# Patient Record
Sex: Female | Born: 1965 | Race: Black or African American | Hispanic: No | Smoking: Current every day smoker
Health system: Southern US, Community
[De-identification: ages and names within clinical notes are randomized; demographics above are authoritative.]

## PROBLEM LIST (undated history)

## (undated) DIAGNOSIS — R011 Cardiac murmur, unspecified: Secondary | ICD-10-CM

## (undated) DIAGNOSIS — G35D Multiple sclerosis, unspecified: Secondary | ICD-10-CM

## (undated) DIAGNOSIS — J189 Pneumonia, unspecified organism: Secondary | ICD-10-CM

## (undated) DIAGNOSIS — M1990 Unspecified osteoarthritis, unspecified site: Secondary | ICD-10-CM

## (undated) DIAGNOSIS — G709 Myoneural disorder, unspecified: Secondary | ICD-10-CM

## (undated) DIAGNOSIS — I6529 Occlusion and stenosis of unspecified carotid artery: Secondary | ICD-10-CM

## (undated) DIAGNOSIS — I639 Cerebral infarction, unspecified: Secondary | ICD-10-CM

## (undated) DIAGNOSIS — G35 Multiple sclerosis: Secondary | ICD-10-CM

## (undated) DIAGNOSIS — E785 Hyperlipidemia, unspecified: Secondary | ICD-10-CM

## (undated) DIAGNOSIS — E119 Type 2 diabetes mellitus without complications: Secondary | ICD-10-CM

## (undated) DIAGNOSIS — I2699 Other pulmonary embolism without acute cor pulmonale: Secondary | ICD-10-CM

## (undated) DIAGNOSIS — R7303 Prediabetes: Secondary | ICD-10-CM

## (undated) DIAGNOSIS — I10 Essential (primary) hypertension: Secondary | ICD-10-CM

## (undated) DIAGNOSIS — N189 Chronic kidney disease, unspecified: Secondary | ICD-10-CM

## (undated) DIAGNOSIS — F319 Bipolar disorder, unspecified: Secondary | ICD-10-CM

## (undated) HISTORY — DX: Multiple sclerosis, unspecified: G35.D

## (undated) HISTORY — DX: Hyperlipidemia, unspecified: E78.5

## (undated) HISTORY — DX: Myoneural disorder, unspecified: G70.9

## (undated) HISTORY — DX: Chronic kidney disease, unspecified: N18.9

## (undated) HISTORY — DX: Pneumonia, unspecified organism: J18.9

## (undated) HISTORY — DX: Other pulmonary embolism without acute cor pulmonale: I26.99

## (undated) HISTORY — DX: Essential (primary) hypertension: I10

## (undated) HISTORY — DX: Prediabetes: R73.03

## (undated) HISTORY — DX: Type 2 diabetes mellitus without complications: E11.9

## (undated) HISTORY — DX: Occlusion and stenosis of unspecified carotid artery: I65.29

## (undated) HISTORY — DX: Bipolar disorder, unspecified: F31.9

## (undated) HISTORY — PX: TONSILLECTOMY: SUR1361

## (undated) HISTORY — DX: Multiple sclerosis: G35

---

## 1991-01-12 HISTORY — PX: NEPHRECTOMY LIVING DONOR: SUR877

## 1992-01-12 DIAGNOSIS — I2699 Other pulmonary embolism without acute cor pulmonale: Secondary | ICD-10-CM

## 1992-01-12 HISTORY — DX: Other pulmonary embolism without acute cor pulmonale: I26.99

## 1997-02-11 ENCOUNTER — Encounter: Admission: RE | Admit: 1997-02-11 | Discharge: 1997-05-12 | Payer: Self-pay | Admitting: Gynecology

## 1997-06-01 ENCOUNTER — Encounter: Admission: RE | Admit: 1997-06-01 | Discharge: 1997-08-20 | Payer: Self-pay | Admitting: *Deleted

## 1998-05-11 ENCOUNTER — Ambulatory Visit (HOSPITAL_COMMUNITY): Admission: RE | Admit: 1998-05-11 | Discharge: 1998-05-11 | Payer: Self-pay | Admitting: Orthopedic Surgery

## 1998-05-11 ENCOUNTER — Encounter: Payer: Self-pay | Admitting: Orthopedic Surgery

## 1999-01-12 HISTORY — PX: OTHER SURGICAL HISTORY: SHX169

## 1999-01-12 HISTORY — PX: APPENDECTOMY: SHX54

## 1999-05-15 ENCOUNTER — Emergency Department (HOSPITAL_COMMUNITY): Admission: EM | Admit: 1999-05-15 | Discharge: 1999-05-15 | Payer: Self-pay | Admitting: Emergency Medicine

## 1999-05-15 ENCOUNTER — Encounter: Payer: Self-pay | Admitting: Emergency Medicine

## 2000-06-24 ENCOUNTER — Other Ambulatory Visit: Admission: RE | Admit: 2000-06-24 | Discharge: 2000-06-24 | Payer: Self-pay | Admitting: *Deleted

## 2000-09-20 ENCOUNTER — Encounter: Admission: RE | Admit: 2000-09-20 | Discharge: 2000-09-20 | Payer: Self-pay | Admitting: Orthopaedic Surgery

## 2000-09-20 ENCOUNTER — Encounter: Payer: Self-pay | Admitting: Orthopaedic Surgery

## 2000-09-30 ENCOUNTER — Emergency Department (HOSPITAL_COMMUNITY): Admission: EM | Admit: 2000-09-30 | Discharge: 2000-09-30 | Payer: Self-pay | Admitting: *Deleted

## 2000-09-30 ENCOUNTER — Encounter: Payer: Self-pay | Admitting: Emergency Medicine

## 2000-10-18 ENCOUNTER — Encounter: Admission: RE | Admit: 2000-10-18 | Discharge: 2000-10-31 | Payer: Self-pay | Admitting: Orthopaedic Surgery

## 2000-11-02 ENCOUNTER — Ambulatory Visit (HOSPITAL_COMMUNITY): Admission: RE | Admit: 2000-11-02 | Discharge: 2000-11-02 | Payer: Self-pay

## 2000-11-02 ENCOUNTER — Encounter (INDEPENDENT_AMBULATORY_CARE_PROVIDER_SITE_OTHER): Payer: Self-pay

## 2002-05-08 ENCOUNTER — Other Ambulatory Visit: Admission: RE | Admit: 2002-05-08 | Discharge: 2002-05-08 | Payer: Self-pay | Admitting: *Deleted

## 2003-02-11 ENCOUNTER — Inpatient Hospital Stay (HOSPITAL_COMMUNITY): Admission: EM | Admit: 2003-02-11 | Discharge: 2003-02-15 | Payer: Self-pay | Admitting: Psychiatry

## 2003-05-19 ENCOUNTER — Emergency Department (HOSPITAL_COMMUNITY): Admission: EM | Admit: 2003-05-19 | Discharge: 2003-05-20 | Payer: Self-pay

## 2004-06-04 ENCOUNTER — Emergency Department (HOSPITAL_COMMUNITY): Admission: EM | Admit: 2004-06-04 | Discharge: 2004-06-05 | Payer: Self-pay | Admitting: Emergency Medicine

## 2004-06-06 ENCOUNTER — Observation Stay (HOSPITAL_COMMUNITY): Admission: RE | Admit: 2004-06-06 | Discharge: 2004-06-08 | Payer: Self-pay | Admitting: Orthopedic Surgery

## 2004-08-12 ENCOUNTER — Inpatient Hospital Stay (HOSPITAL_COMMUNITY): Admission: EM | Admit: 2004-08-12 | Discharge: 2004-08-17 | Payer: Self-pay | Admitting: Emergency Medicine

## 2004-11-20 ENCOUNTER — Encounter: Admission: RE | Admit: 2004-11-20 | Discharge: 2004-11-20 | Payer: Self-pay | Admitting: Orthopedic Surgery

## 2004-12-21 ENCOUNTER — Ambulatory Visit (HOSPITAL_COMMUNITY): Admission: RE | Admit: 2004-12-21 | Discharge: 2004-12-21 | Payer: Self-pay | Admitting: General Surgery

## 2004-12-21 ENCOUNTER — Ambulatory Visit (HOSPITAL_BASED_OUTPATIENT_CLINIC_OR_DEPARTMENT_OTHER): Admission: RE | Admit: 2004-12-21 | Discharge: 2004-12-21 | Payer: Self-pay | Admitting: General Surgery

## 2004-12-21 ENCOUNTER — Encounter (INDEPENDENT_AMBULATORY_CARE_PROVIDER_SITE_OTHER): Payer: Self-pay | Admitting: Specialist

## 2005-04-02 ENCOUNTER — Encounter
Admission: RE | Admit: 2005-04-02 | Discharge: 2005-05-06 | Payer: Self-pay | Admitting: Physical Medicine and Rehabilitation

## 2005-05-13 ENCOUNTER — Encounter: Admission: RE | Admit: 2005-05-13 | Discharge: 2005-05-13 | Payer: Self-pay | Admitting: Orthopedic Surgery

## 2005-06-27 ENCOUNTER — Inpatient Hospital Stay (HOSPITAL_COMMUNITY): Admission: AD | Admit: 2005-06-27 | Discharge: 2005-07-02 | Payer: Self-pay | Admitting: Psychiatry

## 2005-06-28 ENCOUNTER — Ambulatory Visit: Payer: Self-pay | Admitting: Psychiatry

## 2005-08-06 ENCOUNTER — Emergency Department (HOSPITAL_COMMUNITY): Admission: EM | Admit: 2005-08-06 | Discharge: 2005-08-07 | Payer: Self-pay | Admitting: Emergency Medicine

## 2005-09-02 ENCOUNTER — Emergency Department (HOSPITAL_COMMUNITY): Admission: EM | Admit: 2005-09-02 | Discharge: 2005-09-02 | Payer: Self-pay | Admitting: Emergency Medicine

## 2005-09-13 ENCOUNTER — Emergency Department (HOSPITAL_COMMUNITY): Admission: EM | Admit: 2005-09-13 | Discharge: 2005-09-13 | Payer: Self-pay | Admitting: Emergency Medicine

## 2007-01-04 ENCOUNTER — Emergency Department (HOSPITAL_COMMUNITY): Admission: EM | Admit: 2007-01-04 | Discharge: 2007-01-04 | Payer: Self-pay | Admitting: Family Medicine

## 2007-01-25 ENCOUNTER — Encounter: Admission: RE | Admit: 2007-01-25 | Discharge: 2007-01-25 | Payer: Self-pay | Admitting: Family Medicine

## 2007-02-06 ENCOUNTER — Encounter: Admission: RE | Admit: 2007-02-06 | Discharge: 2007-02-06 | Payer: Self-pay | Admitting: Family Medicine

## 2007-03-02 ENCOUNTER — Encounter
Admission: RE | Admit: 2007-03-02 | Discharge: 2007-03-03 | Payer: Self-pay | Admitting: Physical Medicine and Rehabilitation

## 2007-03-06 ENCOUNTER — Ambulatory Visit: Payer: Self-pay | Admitting: *Deleted

## 2007-03-06 ENCOUNTER — Inpatient Hospital Stay (HOSPITAL_COMMUNITY): Admission: EM | Admit: 2007-03-06 | Discharge: 2007-03-08 | Payer: Self-pay | Admitting: *Deleted

## 2007-09-29 ENCOUNTER — Encounter: Admission: RE | Admit: 2007-09-29 | Discharge: 2007-09-29 | Payer: Self-pay | Admitting: Family Medicine

## 2008-01-12 DIAGNOSIS — J189 Pneumonia, unspecified organism: Secondary | ICD-10-CM

## 2008-01-12 HISTORY — DX: Pneumonia, unspecified organism: J18.9

## 2008-03-29 ENCOUNTER — Ambulatory Visit: Payer: Self-pay | Admitting: Critical Care Medicine

## 2008-03-29 ENCOUNTER — Inpatient Hospital Stay (HOSPITAL_COMMUNITY): Admission: EM | Admit: 2008-03-29 | Discharge: 2008-04-06 | Payer: Self-pay | Admitting: Emergency Medicine

## 2008-04-01 ENCOUNTER — Encounter (INDEPENDENT_AMBULATORY_CARE_PROVIDER_SITE_OTHER): Payer: Self-pay | Admitting: *Deleted

## 2008-04-03 ENCOUNTER — Encounter (INDEPENDENT_AMBULATORY_CARE_PROVIDER_SITE_OTHER): Payer: Self-pay | Admitting: Internal Medicine

## 2008-04-03 ENCOUNTER — Ambulatory Visit: Payer: Self-pay | Admitting: Vascular Surgery

## 2008-04-18 ENCOUNTER — Encounter: Payer: Self-pay | Admitting: Internal Medicine

## 2008-04-25 ENCOUNTER — Encounter: Payer: Self-pay | Admitting: Internal Medicine

## 2008-05-23 ENCOUNTER — Encounter: Payer: Self-pay | Admitting: Critical Care Medicine

## 2008-06-11 ENCOUNTER — Encounter: Payer: Self-pay | Admitting: Internal Medicine

## 2008-07-21 ENCOUNTER — Encounter: Payer: Self-pay | Admitting: Internal Medicine

## 2008-08-13 ENCOUNTER — Emergency Department (HOSPITAL_COMMUNITY): Admission: EM | Admit: 2008-08-13 | Discharge: 2008-08-13 | Payer: Self-pay | Admitting: Emergency Medicine

## 2008-08-13 ENCOUNTER — Emergency Department (HOSPITAL_COMMUNITY): Admission: EM | Admit: 2008-08-13 | Discharge: 2008-08-13 | Payer: Self-pay | Admitting: Family Medicine

## 2008-08-14 ENCOUNTER — Encounter: Admission: RE | Admit: 2008-08-14 | Discharge: 2008-08-14 | Payer: Self-pay | Admitting: Family Medicine

## 2008-08-26 ENCOUNTER — Encounter: Payer: Self-pay | Admitting: Internal Medicine

## 2008-10-01 ENCOUNTER — Emergency Department (HOSPITAL_COMMUNITY): Admission: EM | Admit: 2008-10-01 | Discharge: 2008-10-01 | Payer: Self-pay | Admitting: Emergency Medicine

## 2010-02-01 ENCOUNTER — Encounter: Payer: Self-pay | Admitting: Family Medicine

## 2010-02-02 ENCOUNTER — Encounter: Payer: Self-pay | Admitting: Family Medicine

## 2010-02-10 ENCOUNTER — Encounter
Admission: RE | Admit: 2010-02-10 | Discharge: 2010-02-10 | Payer: Self-pay | Source: Home / Self Care | Attending: Family Medicine | Admitting: Family Medicine

## 2010-04-18 LAB — CBC
RBC: 4.54 MIL/uL (ref 3.87–5.11)
WBC: 10.6 10*3/uL — ABNORMAL HIGH (ref 4.0–10.5)

## 2010-04-18 LAB — URINALYSIS, ROUTINE W REFLEX MICROSCOPIC
Glucose, UA: NEGATIVE mg/dL
Leukocytes, UA: NEGATIVE
Nitrite: NEGATIVE
Protein, ur: 100 mg/dL — AB
Urobilinogen, UA: 1 mg/dL (ref 0.0–1.0)

## 2010-04-18 LAB — COMPREHENSIVE METABOLIC PANEL
ALT: 12 U/L (ref 0–35)
AST: 19 U/L (ref 0–37)
Albumin: 3.6 g/dL (ref 3.5–5.2)
CO2: 27 mEq/L (ref 19–32)
Chloride: 105 mEq/L (ref 96–112)
GFR calc Af Amer: >60 mL/min (ref >60)
GFR calc non Af Amer: >60 mL/min (ref >60)
Potassium: 4 mEq/L (ref 3.5–5.1)
Sodium: 138 mEq/L (ref 135–145)
Total Bilirubin: 0.4 mg/dL (ref 0.3–1.2)

## 2010-04-18 LAB — URINE CULTURE

## 2010-04-18 LAB — DIFFERENTIAL
Basophils Absolute: 0.1 10*3/uL (ref 0.0–0.1)
Eosinophils Absolute: 0.1 10*3/uL (ref 0.0–0.7)
Eosinophils Relative: 1 % (ref 0–5)
Lymphs Abs: 3.3 10*3/uL (ref 0.7–4.0)
Monocytes Absolute: 0.6 10*3/uL (ref 0.1–1.0)

## 2010-04-18 LAB — POCT CARDIAC MARKERS
CKMB, poc: <1 ng/mL — ABNORMAL LOW (ref 1.0–8.0)
Myoglobin, poc: 61.9 ng/mL (ref 12–200)
Troponin i, poc: <0.05 ng/mL (ref 0.00–0.09)
Troponin i, poc: <0.05 ng/mL (ref 0.00–0.09)

## 2010-04-18 LAB — URINE MICROSCOPIC-ADD ON

## 2010-04-18 LAB — D-DIMER, QUANTITATIVE: D-Dimer, Quant: 0.68 ug/mL-FEU — ABNORMAL HIGH (ref 0.00–0.48)

## 2010-04-23 LAB — CARDIAC PANEL(CRET KIN+CKTOT+MB+TROPI)
CK, MB: 0.7 ng/mL (ref 0.3–4.0)
CK, MB: 0.7 ng/mL (ref 0.3–4.0)
CK, MB: 0.9 ng/mL (ref 0.3–4.0)
CK, MB: 1 ng/mL (ref 0.3–4.0)
Relative Index: INVALID (ref 0.0–2.5)
Relative Index: INVALID (ref 0.0–2.5)
Relative Index: INVALID (ref 0.0–2.5)
Relative Index: INVALID (ref 0.0–2.5)
Relative Index: INVALID (ref 0.0–2.5)
Relative Index: INVALID (ref 0.0–2.5)
Total CK: 60 U/L (ref 7–177)
Total CK: 65 U/L (ref 7–177)
Total CK: 65 U/L (ref 7–177)
Total CK: 68 U/L (ref 7–177)
Troponin I: 0.01 ng/mL (ref 0.00–0.06)
Troponin I: 0.02 ng/mL (ref 0.00–0.06)
Troponin I: 0.03 ng/mL (ref 0.00–0.06)
Troponin I: 0.04 ng/mL (ref 0.00–0.06)
Troponin I: <0.01 ng/mL (ref 0.00–0.06)

## 2010-04-23 LAB — BASIC METABOLIC PANEL
CO2: 28 mEq/L (ref 19–32)
Calcium: 8.2 mg/dL — ABNORMAL LOW (ref 8.4–10.5)
Chloride: 106 mEq/L (ref 96–112)
Creatinine, Ser: 0.84 mg/dL (ref 0.4–1.2)
GFR calc Af Amer: >60 mL/min (ref >60)
GFR calc Af Amer: >60 mL/min (ref >60)
GFR calc non Af Amer: >60 mL/min (ref >60)
GFR calc non Af Amer: >60 mL/min (ref >60)
Potassium: 3.8 mEq/L (ref 3.5–5.1)
Sodium: 138 mEq/L (ref 135–145)
Sodium: 139 mEq/L (ref 135–145)

## 2010-04-23 LAB — BLOOD GAS, ARTERIAL
Acid-Base Excess: 0.9 mmol/L (ref 0.0–2.0)
Bicarbonate: 25.4 mEq/L — ABNORMAL HIGH (ref 20.0–24.0)
TCO2: 26.7 mmol/L (ref 0–100)
pCO2 arterial: 43.5 mmHg (ref 35.0–45.0)
pO2, Arterial: 45.8 mmHg — ABNORMAL LOW (ref 80.0–100.0)

## 2010-04-23 LAB — COMPREHENSIVE METABOLIC PANEL
ALT: 111 U/L — ABNORMAL HIGH (ref 0–35)
AST: 113 U/L — ABNORMAL HIGH (ref 0–37)
Albumin: 2.5 g/dL — ABNORMAL LOW (ref 3.5–5.2)
Albumin: 3.8 g/dL (ref 3.5–5.2)
Alkaline Phosphatase: 65 U/L (ref 39–117)
Alkaline Phosphatase: 88 U/L (ref 39–117)
BUN: 3 mg/dL — ABNORMAL LOW (ref 6–23)
CO2: 23 mEq/L (ref 19–32)
Calcium: 9.3 mg/dL (ref 8.4–10.5)
Chloride: 104 mEq/L (ref 96–112)
Creatinine, Ser: 0.8 mg/dL (ref 0.4–1.2)
GFR calc Af Amer: >60 mL/min (ref >60)
GFR calc non Af Amer: >60 mL/min (ref >60)
Glucose, Bld: 107 mg/dL — ABNORMAL HIGH (ref 70–99)
Potassium: 3.3 mEq/L — ABNORMAL LOW (ref 3.5–5.1)
Potassium: 3.9 mEq/L (ref 3.5–5.1)
Sodium: 133 mEq/L — ABNORMAL LOW (ref 135–145)
Total Bilirubin: 0.7 mg/dL (ref 0.3–1.2)
Total Protein: 7 g/dL (ref 6.0–8.3)

## 2010-04-23 LAB — URINALYSIS, ROUTINE W REFLEX MICROSCOPIC
Bilirubin Urine: NEGATIVE
Glucose, UA: NEGATIVE mg/dL
Ketones, ur: NEGATIVE mg/dL
Nitrite: NEGATIVE
Specific Gravity, Urine: 1.008 (ref 1.005–1.030)
Urobilinogen, UA: 1 mg/dL (ref 0.0–1.0)
pH: 7 (ref 5.0–8.0)

## 2010-04-23 LAB — DIFFERENTIAL
Basophils Relative: 0 % (ref 0–1)
Eosinophils Absolute: 0 10*3/uL (ref 0.0–0.7)
Monocytes Absolute: 0.7 10*3/uL (ref 0.1–1.0)
Monocytes Relative: 6 % (ref 3–12)

## 2010-04-23 LAB — CBC
HCT: 28.1 % — ABNORMAL LOW (ref 36.0–46.0)
Hemoglobin: 10 g/dL — ABNORMAL LOW (ref 12.0–15.0)
Hemoglobin: 13.1 g/dL (ref 12.0–15.0)
Hemoglobin: 9.4 g/dL — ABNORMAL LOW (ref 12.0–15.0)
MCHC: 34 g/dL (ref 30.0–36.0)
MCHC: 34 g/dL (ref 30.0–36.0)
MCV: 88.8 fL (ref 78.0–100.0)
MCV: 89.9 fL (ref 78.0–100.0)
Platelets: 247 10*3/uL (ref 150–400)
Platelets: 310 10*3/uL (ref 150–400)
RBC: 3.08 MIL/uL — ABNORMAL LOW (ref 3.87–5.11)
RBC: 3.28 MIL/uL — ABNORMAL LOW (ref 3.87–5.11)
RBC: 3.36 MIL/uL — ABNORMAL LOW (ref 3.87–5.11)
RBC: 4.33 MIL/uL (ref 3.87–5.11)
RDW: 14.2 % (ref 11.5–15.5)
WBC: 12.1 10*3/uL — ABNORMAL HIGH (ref 4.0–10.5)
WBC: 8.1 10*3/uL (ref 4.0–10.5)

## 2010-04-23 LAB — CULTURE, BLOOD (ROUTINE X 2)

## 2010-04-23 LAB — LEGIONELLA PROFILE(CULTURE+DFA/SMEAR)

## 2010-04-23 LAB — POCT CARDIAC MARKERS: Myoglobin, poc: 85.1 ng/mL (ref 12–200)

## 2010-04-23 LAB — CK TOTAL AND CKMB (NOT AT ARMC)
CK, MB: 1.1 ng/mL (ref 0.3–4.0)
Total CK: 108 U/L (ref 7–177)

## 2010-04-23 LAB — CULTURE, RESPIRATORY W GRAM STAIN

## 2010-04-23 LAB — LACTATE DEHYDROGENASE: LDH: 206 U/L (ref 94–250)

## 2010-04-23 LAB — TSH: TSH: 0.301 u[IU]/mL — ABNORMAL LOW (ref 0.350–4.500)

## 2010-04-23 LAB — AFB CULTURE WITH SMEAR (NOT AT ARMC): Acid Fast Smear: NONE SEEN

## 2010-04-23 LAB — FUNGUS CULTURE W SMEAR

## 2010-05-26 NOTE — Consult Note (Signed)
[name], [name]              ACCOUNT NO.:  [id number]   MEDICAL RECORD NO.:  [id number]          PATIENT TYPE:  INP   LOCATION:  4743                         FACILITY:  [facility]   PHYSICIAN:  Charlcie Cradle. Delford Field, MD, FCCPDATE OF BIRTH:  12-19-1965   DATE OF CONSULTATION:  04/04/2008  DATE OF DISCHARGE:                                 CONSULTATION   This is a 45 year old female who was admitted on March 29, 2008, to the  [facility], previous history of pulmonary embolism more than 10  years ago, chronic bipolar disorder, shoulder scoliosis, previous  tobacco use with ongoing tobacco use, admitted with onset of chest pain  and cough.  Her cough began 7 days prior to admission with URI syndrome  and diffuse body aches and fever.  She progressed to that producing  thick phlegm, which was yellow in nature and fever as well.  Symptoms  progressed.  She also had some nausea and vomiting and headaches as  well.  She was admitted, noted bilateral infiltrates on chest x-ray and  initially treated with Zithromax and Rocephin.  Blood cultures were  obtained.  There were no growth so far.  Because of progression in  symptoms and lack of clearance in infiltrates, Pulmonary is asked to see  this patient.  It is notable that she has not been able to cough out  much mucus and has not been on any type of respiratory therapy  throughout this admission.   PAST MEDICAL HISTORY:  Medical history of bipolar disorder, history of  suicidal ideation in the past with previous acetaminophen overdose in  the past, history of pulmonary embolism more than 10 years ago, off  anticoagulation now.   SOCIAL HISTORY:  Active smoking for more than 5 years.   FAMILY HISTORY:  Noncontributory.   MEDICATIONS:  Trazodone and Lamictal.   ALLERGIES TO MEDICATIONS:  IVP DYE, OXYCODONE, and SEPTRA.   REVIEW OF SYSTEMS:  Otherwise noncontributory other than as noted in  history of present illness.   PHYSICAL  EXAMINATION:  VITAL SIGNS:  Currently, temperature is 98, blood  pressure 145/86, pulse 108, respirations 24, saturation 96% on 2 L.  GENERAL:  This is a well-developed, well-nourished African American  female in no acute distress.  CHEST:  Decreased breath sounds at the bases, dullness to percussion one-  half the way up, bilateral scattered rhonchi, consolidated changes in  the lower lung zones.  NEUROLOGIC:  Intact.  HEENT:  Unremarkable.  CARDIAC:  Regular rate and rhythm without S3.  Normal S1 and S2.  ABDOMEN:  Unremarkable.  EXTREMITIES:  No edema or clubbing.  SKIN:  Clear.   LABORATORY DATA:  White count now 12,100, hemoglobin 9.4, platelet count  310,000.  Sodium 139, potassium 3.8, chloride 106, CO2 of 28, BUN 6,  creatinine 0.8, blood sugar 131.  Liver functions mildly elevated at 113  and __________ on the AST and ALT, alk phos 65, total bilirubin 0.7.   DICTATION ENDED AT THIS POINT      Charlcie Cradle. Delford Field, MD, [facility]  Electronically Signed     PEW/MEDQ  D:  04/04/2008  T:  04/05/2008  Job:  [id number]   cc:   [facility]  Dr. Wende Bushy

## 2010-05-26 NOTE — H&P (Signed)
[name], [name]              ACCOUNT NO.:  [id number]   MEDICAL RECORD NO.:  [id number]          PATIENT TYPE:  INP   LOCATION:  4743                         FACILITY:  [facility]   PHYSICIAN:  Beckey Rutter, MD  DATE OF BIRTH:  03-Sep-1965   DATE OF ADMISSION:  03/29/2008  DATE OF DISCHARGE:                              HISTORY & PHYSICAL   PRIMARY CARE PHYSICIAN:  Dr. Wende Bushy in [place].  This patient is  unassigned to [place].   CHIEF COMPLAINT:  Chest pain and cough.   HISTORY OF PRESENT ILLNESS:  This is a 45 year old pleasant African  American female with past medical history significant for history of  pulmonary embolism more than 10 years ago, bipolar, shoulder scoliosis,  and tobacco abuse presented today with chief complaint of chest pain and  cough.  The patient stated that the chest pain is associated with the  cough.  The cough started 7 days ago first as symptoms of common cold  with runny nose, fever, and body aches.  The condition progressed to  cough with phlegm, which is yellow.  The patient was also complaining of  fever.  There is no measurement at home, but she felt feverish now and  then.  With those symptoms, there is association of nausea and vomiting  and headache as of yesterday.  The patient denied any vomiting blood and  denied air hunger, although she felt mild shortness of breath.   PAST MEDICAL HISTORY:  Significant for bipolar disorder.  The patient  had history of suicidal ideation previously with overdose of  acetaminophen.  She has history of pulmonary embolism more than 10 years  ago, finished the treatment, and she is no longer on anticoagulation.   SOCIAL HISTORY:  She is a cigarette smoker with 5 years back.  She  denied regular alcohol consumption and denied drug abuse.   FAMILY HISTORY:  Noncontributory.   MEDICATIONS:  1. Trazodone.  2. Lamictal.   MEDICATION ALLERGIES:  1. IVP DYE WITH IODINE SUBSTANCE.  2. OXYCODONE.  3.  SEPTRA.   REVIEW OF SYSTEMS:  The patient denied any urological symptoms.  The 14-  point review of systems is noncontributory other than the HPI.   PHYSICAL EXAMINATION:  VITAL SIGNS:  Temperature is 97.5.  Her T-max  registered in the ED is 99.4, blood pressure is 115 x 58.  HEAD:  Atraumatic and normocephalic.  Eyes:  PERRL.  Mouth:  Dry lips  and dry mouth mucosa.  NECK:  Supple.  No JVD.  LUNGS:  Bilateral fair entry.  The patient has mild basal crepitation  bilaterally.  Also mild wheezes on the right lung base on posterior  auscultation.  CARDIAC:  The first and second heart sounds audible.  No added sounds.  ABDOMEN:  Soft and nontender.  Bowel sounds present.  EXTREMITIES:  No lower extremity edema.  NEUROLOGIC:  She is alert and oriented x3, moving all her extremities  spontaneously.   LABORATORIES AND X-RAYS:  Her sodium is 132, potassium 3.9, chloride 96,  bicarbonate 24, glucose 107, BUN is 3, creatinine 0.85, calcium  9.3.  AST is 143.  Urinalysis is yellow with a specific gravity of 1.008,  negative nitrate, and negative leukocyte esterase.  The white blood  count in the urine is 0-2.  White blood count is 10.8, hemoglobin is  13.1, hematocrit is 38.4, and platelet is 145.  The neutrophil  percentage is 74% and CK-MB is less than 1.0.  Troponin is less than  0.5.   Chest x-ray is showing:  1. Harrington effusion that is noted with residual scoliosis.  Faint      basilar density is noted suspicious for atelectasis versus      development of infiltrate or pneumonia.  No large effusion or      pneumothorax.  Midline trachea.   ASSESSMENT AND PLAN:  This is a 45 year old female with bilateral basal  atelectasis and pneumonia likely secondary after upper respiratory viral  infection.  The patient is also dehydrated at this time and complaining  of muscular pain with the cough.   PLAN:  1. The patient will be admitted to telemetry floor to rule out acute      coronary  syndrome, although it is unlikely.  We will obtain cardiac      enzymes every 8 hours.  I will start the patient on antibiotic for      bilateral pneumonia with Rocephin and Zithromax.  2. The patient is dehydrated and I will rehydrate the patient with      intravenous fluid.  3. Tachycardia.  This is felt secondary to the state of dehydration      and possible fever the patient had previously.  For now we will      rehydrate the patient and reevaluate.  We will also obtain STAT      EKG.  4. The patient has muscular pain in the thoracic cage area especially      when she coughs.  I will start the patient on pain control.  The      patient has history of suicidal ideation with acetaminophen in the      previous occasion and she is allergic to oxycodone.  For now, I      will give intravenous Dilaudid cautiously.  5. For DVT prophylaxis, we will start Lovenox.  6. For GI prophylaxis, we will consider Protonix.      Beckey Rutter, MD  Electronically Signed     EME/MEDQ  D:  03/29/2008  T:  03/30/2008  Job:  [id number]

## 2010-05-26 NOTE — Discharge Summary (Signed)
[name], [name]              ACCOUNT NO.:  [id number]   MEDICAL RECORD NO.:  [id number]          PATIENT TYPE:  INP   LOCATION:  4743                         FACILITY:  [facility]   PHYSICIAN:  Charlestine Massed, MDDATE OF BIRTH:  [birth year]   DATE OF ADMISSION:  03/29/2008  DATE OF DISCHARGE:  04/06/2008                               DISCHARGE SUMMARY   PRIMARY CARE PHYSICIAN:  Brandt Loosen, MD. who belongs to [facility] at [address] [place], [place]  [address].  Phone number [phone].   PULMONOLOGY:  Charlcie Cradle. Delford Field, MD.   REASON FOR ADMISSION:  Chest pain and cough.   DISCHARGE DIAGNOSES:  1. Chronic obstructive pulmonary disease with hypoxemia.  2. Home oxygen therapy for chronic obstructive pulmonary disease.  3. Bilateral pneumonia, resolved.  4. Bipolar disorder.  5. History of pulmonary embolism 12 years ago.  Currently no issues      and not on Coumadin.  6. Tachycardia secondary to hypoxemia as well as baseline anxiety.   DISCHARGE MEDICATIONS:  1. Spiriva 18 mcg inhalation once daily.  2. Albuterol nebulizer 2.5 mg inhalation q.6 h. p.r.n.  3. Atrovent 0.5 mg nebulizer inhalation q.6 h. p.r.n.  4. Home oxygen 1-2 liters per minute by nasal cannula 24 hours a day.  5. Abilify 5 mg p.o. daily.  6. Cymbalta 60 mg p.o. daily.  7. Mucinex 200 mg p.o. b.i.d.  8. Lamictal 200 mg p.o. daily.  9. Trazodone 200 mg p.o. q.h.s.  10.Prilosec 20 mg p.o. daily.   HOSPITAL COURSE:  1. Lung disease.  The patient has lung disease - chronic obstructive      pulmonary disease,  bilateral pneumonia, diffuse, resolved and      chronic bronchitis as part of the chronic obstructive pulmonary      disease.  The patient was admitted basically for shortness of      breath.  The patient has a strong history of tobacco use for more      than 10 years.  She smokes a lot as per her mother who was there.      She stated that the patient smoked  more than what she mentioned.      She was initially diagnosed with bilateral pneumonia and was      started on Rocephin and Zithromax.  Pulmonary embolism study in      view of the history that she had a prior history of pulmonary      embolism.  She had a PE study which was a CAT scan of the chest      which was negative for pulmonary embolism, but she showed dense      airspace consolidation in bilateral upper lobes and in the left      lower lobe with diffuse pneumonia.  The patient was continued on      antibiotics.  After 6-7 days of antibiotics, the patient was still      hypoxic while she is walking today walking out to the bathroom      without oxygen.  The ABG was done on  room air after minimal      exertion which showed a pH of 7.384, pCO2 of 43.5 and pO2 of 45.8      on room air.  In view of these findings, pulmonology consult was      called and the patient was seen by Dr. Delford Field who opined that the      patient has possibly chronic obstructive pulmonary disease and with      some interstitial disease.  The patient underwent bronchoscopy with      bronchoalveolar lavage.  So far the Legionella studies and the      other studies have been negative.  The patient was seen today also      by pulmonology in view of the fact that her pulse ox is very low.      She will be eligible for home oxygen therapy.  Today, the patient      had a pulse ox checked on 2 liters of oxygen which was 94%.  Then      oxygen was taken off and after 5 minutes a pulse ox was checked      which was between 89-90%.  Then the patient was asked to walk the      hallways.  She walked past 5-6 rooms and by that time her pulse ox      dropped to 85% on room air with very minimal exertion.  With this      criteria, the patient fits into the category for home oxygen      eligibility.  The patient is being discharged home on home oxygen.      She will be also continuing Spiriva 18 mcg inhalation once daily,       albuterol and Atrovent.  There is no need for steroids at this      moment as she was not actively wheezing at this time and she does      not have any asthma in that.  The patient has been advised to stop      smoking.  The patient stated that she will definitely stop smoking.      She was not aware of the side effects of smoking to this extent.      She has been counseled that stopping smoking is the only way to      minimize the progress of the disease.  Full disease education was      given to her for more than 15 minutes over the period of 2 days, 15      minutes each day.  The patient exhibits understanding  2. There is history of bipolar disorder, currently her mental status      is stable even though she is slightly anxious all the time.  She      gets very anxious for even minimal small issues when she is not      able to get up suddenly.  She has tachycardia which I believe is      due to the chronic hypoxemia and the anxiety.  Continue Cymbalta,      Lamictal, trazodone and Abilify.  Needs to follow up with a      psychiatrist as outpatient or with the primary care doctor as      outpatient.  3. Tachycardia at baseline.  She has a baseline tachycardia which      sometimes heart rate is 88 when she is sleeping.  Only when she is  awake, her heart rate goes about 100 especially if she is awakened      and tries to get up.  She is extremely anxious.  She has a baseline      history of that.  No other pathology was found.  Otherwise no other      pathology has been attributed to her tachycardia so far.   DIAGNOSTICS:  1. CAT scan of the chest.  2. CT pulmonary angiogram was done to rule out pulmonary embolism.      There was no central pulmonary embolism.  No small peripheral      embolism could not be assessed clearly due to the patient      artifacts.  There was evidence of bilateral upper airway disease,      small bilateral pleural effusions were noted and advanced  diffuse      airspace disease was reported.  This study was done on April 02, 2008.  3. Chest x-ray done on April 06, 2008:  Bilateral airspace disease,      worse in the left lung is seen, but there is considerable      improvement seen in the patchy airspace opacities of the right      lung, bilateral effusions are noted.  4. Echocardiogram done on April 01, 2008 shows an ejection fraction of      70%.  No regional wall motion abnormalities that were critical.      Study was not sufficient for evaluation of diastolic dysfunction.      No aortic stenosis.  No aortic regurgitation.  No mitral stenosis.      Mild mitral regurgitation was seen.  Right ventricular size was      normal, tricuspid valve was normal, trivial pulmonary      regurgitation, pulmonary artery was grossly normal.  No ST      elevation.  Right and left atrium were normal.  5. Bilateral lower extremity venous Doppler done on April 03, 2008 was      negative for any distal DVT, no superficial venous thrombosis.  No      Baker's cyst.   DISPOSITION:  The patient is discharged back home on home oxygen and  nebulizers.  The patient will have the representative from the company  helping her out with setting up these items.   FOLLOW UP:  Follow with Dr. Brandt Loosen in 1-2 weeks for reevaluation of  her lung status and further evaluation of her home oxygen.   A total of 1 hour was spent on the discharge education of the patient,  re-assessing and preparing the home oxygen, preparing for home oxygen  and setting up other treatments ready for the patient with the  representative.      Charlestine Massed, MD  Electronically Signed     UT/MEDQ  D:  04/06/2008  T:  04/06/2008  Job:  [id number]   cc:   Brandt Loosen, MD  Charlcie Cradle. Delford Field, MD, FCCP

## 2010-05-26 NOTE — H&P (Signed)
NAMEMAE, DENUNZIO NO.:  [id number]   MEDICAL RECORD NO.:  [id number]          PATIENT TYPE:  IPS   LOCATION:  0304                          [facility]ACILITY[facility]  [facility]   PHYSICIAN:  Jasmine Pang, M.D. DATE OF BIRTH:  1965/11/25   DATE OF ADMISSION:  03/06/2007  DATE OF DISCHARGE:                       PSYCHIATRIC ADMISSION ASSESSMENT   IDENTIFICATION:  A 45 year old female who is single.  This is an  involuntary admission.   HISTORY OF PRESENT ILLNESS:  Patient presented on petition referred by  mental health after her mother petitioned her for 2 weeks of restless  and agitated behavior.  According to the petition, she had threatened to  kill her caregiver twice and had threatened that she wanted to disappear  and never come back which was interpreted to me that she was suicidal.  Also, she apparently is impulsively calling her mother every day and at  all hours of the night, and it was noted that she appears to hear voices  and was talking with her deceased father.  The patient is alert and  oriented x4 today.  Speech is pressured and hyperverbal.  She freely  admits to being angry that she was petitioned, but feels that her mother  is vindictive and angry because she has not followed through with the  type of jobs and work that she finds acceptable.  She reports that she  had been living with her aunt who was using marijuana, and she felt that  the living conditions were substandard, so she went to live in a hotel  for the past 4 days.  On March 1, she has made arrangements to start  renting her own apartment.  She reports that she was having difficulty  handling the dynamics of the situation between her aunt  with whom she  lived and her mother who lives in a different home.  The patient has two  children who live with her mother and whom she sees on a regular daily  basis.  She is denying any suicidal thoughts or homicidal thought.   PAST PSYCHIATRIC HISTORY:   The patient is currently followed by Dr.  Micheline Maze at the [facility] Team for [facility].  This is  one of several [facility] admissions with her last one in 2007.  She also has a  history of one admission [facility] and [facility] in 2007.  She  has a history of alcohol abuse and bipolar disorder with an unclear  history of manic phases.  She has been treated in the past with Depakote  and Risperdal and is currently managed with Lamictal, Cymbalta and  trazodone and says that she has done well on this.  She denies any  current substance abuse and says that she has been abstinent since she  completed [facility] stay and rehab there in 2007.  She reports  compliance with her current medications.  She does have a history of  prior suicide attempt by overdosing on Tylenol.   SOCIAL HISTORY:  This is a divorced [place] American female with two  children who are currently residing  with her mother.  They are age 11  and 29.  She reports that her daughter is a straight A Consulting civil engineer.  The  children reside with her mother who lives nearby.  She reports that  facilities at her mother's including computer access and e-mail access  are better at her mother's than at the aunt's home.  She is interacting  with her children daily.   FAMILY HISTORY:  Father committed suicide.   MEDICAL HISTORY:  1. The patient is followed by Dr. Vedia Pereyra, her      ophthalmologist, at [phone].  Current medical problems are      significant eye infection that she has had for 3 days.  He had      prescribed antibiotics which she had not yet filled.  2. History of back pain after falling on the ice February 4 with L4-5      nerve damage.  3. Scoliosis with placement of Harrington rods.  4. Appendectomy.  5. Fracture of the left arm with open reduction internal fixation and      pin placement.  6. History of donating a kidney to her sister.   CURRENT MEDICATIONS:  1. Lamictal 200 mg once daily.   2. Cymbalta 60 mg daily.  3. Trazodone 200 mg at bedtime.  4. Neurontin 300 mg t.i.d. for her back pain.  5. She also was prescribed last week a TENS unit to help with the back      pain   She gives a coherent medication history and reports that she is taking  these on a regular basis and daily.   DRUG ALLERGIES:  SULFA.   PHYSICAL EXAMINATION:  GENERAL:  This is a well-nourished, well-  developed female who is angry, irritable, 5 feet 6 inches tall, 160  pounds, temperature 97.2, pulse 96, respirations 16, blood pressure  130/77, who has an obvious eye infection with purulent eye drainage, wet  watery eyes with a drainage down her face.  She has been generally  uncooperative with the physical exam today.  We are going to give her  some time to calm down before we do a full physical.  Her motor exam is  normal.  Sensory appears intact.  Her gait is normal.  No involuntary  movements.  She appears to have full active range of motion.  We are  going to have an ophthalmologist examine her today for her eye  infection, and her vital signs are within normal limits.   DIAGNOSTIC STUDIES:  She has so far refused basic labs studies which  were ordered.   MENTAL STATUS EXAM:  Angry and irritable African American female.  Appears miserable with quite a bit of eye drainage, but denying that she  is actively uncomfortable.  She is afebrile.  Affect is irritable and  sad.  She is able to speak in a normal voice, gives [place] a coherent  history, feels very defensive about why she is here.  Feels that her  mother as being vindictive towards her, and she wants to be at home with  her children.  Mood is irritable and angry.  Thought process is actually  logical and coherent.  No signs of hallucinations.  No internal  distractions.  She has spoken quite angrily over the phone to her mother  today, but does demonstrate the ability to calm herself down and be  coherent.  Denying a suicidal or homicidal  thoughts.  Is disappointed  that she can be there  for her children this afternoon, and is currently  upset about the petition.  Denying homicidal thought.  She is oriented  to person, place and situation.  No overtly delusional statements made.   DIAGNOSES:  AXIS I:  Bipolar disorder NOS.  EtOH abuse in remission.  AXIS II:  Deferred.  AXIS III:  Eye infection, chronic back pain, history of nephrectomy,  history of Harrington rod placement.  AXIS IV:  Severe issues with family conflict.  AXIS V:  Current 38, past year not known.   PLAN:  Involuntarily admit the patient with q.15-minute checks in place  to evaluate her mood and thinking.  We are going to continue her routine  medications at this point, and we have arranged for her to keep her  appointment for an eye exam with her MD at 10:20 this morning, and she  will go there accompanied by security.  She has given [place] permission to  contact her family and to hear their side of the story and pursue  getting input from her close friends  and her boyfriend.  Meanwhile, will allow her use her TENS unit, and she  is placed on contact isolation for the next 48 hours until we learn more  about her eye infection.   ESTIMATED LENGTH OF STAY:  Five days.      Margaret A. Lorin Picket, N.P.      Jasmine Pang, M.D.  Electronically Signed    MAS/MEDQ  D:  03/07/2007  T:  03/08/2007  Job:  [id number]

## 2010-05-26 NOTE — Consult Note (Signed)
[name], PREDMORE              ACCOUNT NO.:  [id number]   MEDICAL RECORD NO.:  [id number]          PATIENT TYPE:  INP   LOCATION:  4743                         FACILITY:  [facility]   PHYSICIAN:  Charlcie Cradle. Delford Field, MD, FCCPDATE OF BIRTH:  Dec 21, 1965   DATE OF CONSULTATION:  04/04/2008  DATE OF DISCHARGE:                                 CONSULTATION   ADDENDUM   LABORATORY DATA:  Chest x-ray and CT scan of chest were reviewed.  There  is consolidation and collapse of both lower lung zones.  There is also  patchy infiltrates in the upper lobes as well.  There is hilar  lymphadenopathy seen.  No pericardial disease is seen.  There is fairly  extensive airspace disease in both upper and central lung zones noted.  No pulmonary embolism is seen.   IMPRESSION:  Persistent mucus plugging in a patient with poor pulmonary  toilet, community-acquired pneumonia, organism not specified, ongoing  hypoxemia and difficulty with secretion, clearance, and organism not yet  identified, doubt H!N1, doubt pulmonary fibrosis, active smoking is  aggravating the above with chronic bronchitic symptoms and chronic  Abilify use, Lamictal use also contributing to secretion plugging and  drying of secretions.   RECOMMENDATIONS:  1. Begin nebulized therapy and flutter valve and incentive spirometry.  2. Pursue bronchoscopy for therapeutic as well as diagnostic purposes.  3. Avoid systemic steroids in this patient with bipolar disorder and      previous history of suicidal ideation.      Charlcie Cradle Delford Field, MD, [facility]  Electronically Signed     PEW/MEDQ  D:  04/04/2008  T:  04/05/2008  Job:  [id number]   cc:   Dr. Jackqulyn Livings Service

## 2010-05-26 NOTE — Discharge Summary (Signed)
[name], MCCLATCHY              ACCOUNT NO.:  [id number]   MEDICAL RECORD NO.:  [id number]          PATIENT TYPE:  INP   LOCATION:  4743                         FACILITY:  [facility]   PHYSICIAN:  Beckey Rutter, MD  DATE OF BIRTH:  Dec 26, 1965   DATE OF ADMISSION:  03/29/2008  DATE OF DISCHARGE:                               DISCHARGE SUMMARY   PRIMARY CARE PHYSICIAN:  Unassigned [facility].   CHIEF COMPLAINT:  Shortness of breath.   HISTORY OF PRESENT ILLNESS:  [name] is a pleasant 45 year old  African American lady with past medical history significant for  pulmonary embolism, shoulder scoliosis, and tobacco abuse who presented  with a chief complaint of cough, chest pain, and mild shortness of  breath.   1. The patient was found to have bilateral pneumonia and she was      started on Rocephin and Zithromax.  The patient is improving      overall, nevertheless the patient continued to have tachycardia.      Today, I am going to discontinued the Zithromax and continue the      patient on Rocephin only.  I will keep the patient in the hospital      for further monitoring, especially for the tachycardia.  2. Chest discomfort.  This was felt secondary to the bilateral and      diffuse pneumonia.  The patient was ruled out for acute coronary      syndromes.  3. Tachycardia.  The patient was ruled out for pulmonary embolism by      the D-dimer and the CT angio.  Please see below.  4. Bipolar.  The patient remained stable during hospital stay.  We      will continue on the medication.   HOSPITAL PROCEDURES AND X-RAYS:  1. Chest x-ray on March 29, 2008.  Impression is mildly increased      bibasilar density compared to September 13, 2005, suspicious for      atelectasis or developing infiltrate.  2. On April 02, 2008, the patient had a CT angiogram with the      impression reading no CT evidence for acute pulmonary embolus,      although segmental and subsegmental pulmonary  artery are not      reliably evaluated secondary to combination of advanced diffuse      airspace disease, respiratory motion, and streak of lung sac.  Airspace consolidation in the central lobes bilaterally without  associated bibasilar dependent collapse/consolidation.  Imaging features  are compatible with diffuse pneumonia.  1. The patient had 2-D echo on April 01, 2008.  The impression was      reading overall left ventricular ejection fraction estimated to be      70%.  There is no diagnostic evidence of left ventricular regional      wall motion abnormalities.  Left ventricular wall thickness was      mildly increased.  There was mild mitral valvular regurgitation.   DISCHARGE DIAGNOSES:  1. Bilateral pneumonia/diffuse pneumonia.  2. Tachycardia felt secondary to bilateral pneumonia/diffuse      pneumonia.  3.  Bipolar disorder with remote history of suicidal ideation by      acetaminophen overdose.  4. History of pulmonary embolism.   DISCHARGE MEDICATIONS:  Discharge medication will be dictated on the day  of actual discharge.      Beckey Rutter, MD  Electronically Signed     EME/MEDQ  D:  04/02/2008  T:  04/03/2008  Job:  [id number]

## 2010-05-26 NOTE — Op Note (Signed)
[name], [name]              ACCOUNT NO.:  [id number]   MEDICAL RECORD NO.:  [id number]          PATIENT TYPE:  INP   LOCATION:  4743                         FACILITY:  [facility]   PHYSICIAN:  Charlcie Cradle. Delford Field, MD, FCCPDATE OF BIRTH:  07-Feb-1965   DATE OF PROCEDURE:  04/05/2008  DATE OF DISCHARGE:                               OPERATIVE REPORT   CHIEF COMPLAINT:  Bilateral pneumonia, and inspissated secretions.   OPERATOR:  Charlcie Cradle. Delford Field, MD, FCCP.   ANESTHESIA:  Xylocaine 1% local.   PREOPERATIVE MEDICATIONS:  Fentanyl 50 mcg, Versed 4 mg IV push.   PROCEDURE:  The Pentax video bronchoscope was introduced into the right  naris.  The upper area was visualized and unremarkable.  The entire  tracheobronchial tree was visualized revealed no endobronchial lesions,  no inspissated secretions.  Bronchial washings were obtained of right  lower lobe.  No other specimens were obtained.   COMPLICATIONS:  None.   IMPRESSION:  Bilateral pneumonia with no evidence of mucus plugging.   RECOMMENDATION:  Follow microbiologic data.      Charlcie Cradle Delford Field, MD, [facility]  Electronically Signed     PEW/MEDQ  D:  04/05/2008  T:  04/06/2008  Job:  [id number]   cc:   [facility].

## 2010-05-29 NOTE — Discharge Summary (Signed)
NAME:  [name], [name] NO.:  [id number]   MEDICAL RECORD NO.:  [id number]                   PATIENT TYPE:  IPS   LOCATION:  0503                                 FACILITY[facility]  [facility]   PHYSICIAN:  Carolanne Grumbling, M.D.                 DATE OF BIRTH:  [birth year]   DATE OF ADMISSION:  02/11/2003  DATE OF DISCHARGE:  02/15/2003                                 DISCHARGE SUMMARY   INITIAL ASSESSMENT AND DIAGNOSIS:  [name] was a 45 year old female.  [name] was admitted after complaining of depressive symptoms and having  written suicidal letters.  She had had problems since she broke up with her  husband several months before and she had lost her job, reportedly for  stalking her husband.  She had been drinking up to 48 ounces of beer per day  since that time back in about September of 2004.   MENTAL STATUS AT THE TIME OF THE INITIAL EVALUATION:  Revealed an alert,  oriented young women who was cooperative.  Speech was clear.  She was  depressed.  She had passive suicidal ideation of wishing to be dead but no  active suicidal thoughts or intent.  There was no evidence of any thought  disorder or psychosis.  Short and long term memory were intact.  Judgment  and insight were poor.   PHYSICAL EXAMINATION:  Noncontributory.   ADMITTING DIAGNOSIS:   AXIS I:  1. Bipolar disorder.  2. Alcohol abuse.   AXIS II:  Deferred.   AXIS III:  Healthy.   AXIS IV:  Severe.   AXIS V:  25/65.   FINDINGS:  All indicated laboratory examinations were within normal limits  or noncontributory.   HOSPITAL COURSE:  While in the hospital, [name] talked about her issues  with her loss of her husband, feeling lost herself because of that.  Her  biggest supporters were her mother and her sister.  She had 2 children and  that was her biggest connection to life and the reason she was holding on.  She gradually felt less depressed.  By the time of discharge she was  denying  suicidal thoughts, passive or active, and she wanted to go home to e with  her family and she was discharged.  She also at that time was making it  clear that she understood her marriage was over, that it was fruitless to  pursue her husband any further, and she needed to get a job and get on with  her life.   FINAL DIAGNOSES:   AXIS I:  1. Bipolar disorder not otherwise specified.  2. Alcohol abuse.   AXIS II:  Deferred.   AXIS III:  Healthy.   AXIS IV:  Severe.   AXIS V:  50/65.   MEDICATIONS AT THE TIME OF DISCHARGE:  1. Effexor XR 75 mg daily.  2. Depakote ER 1000 mg at  bedtime.  3. Seroquel 25 mg tablets, taking 2 at bedtime.  4. Topamax 25 mg twice daily.   POST HOSPITAL CARE PLANS:  She was to follow up at the [facility] with an appointment for February 8 at the [facility]  office.   DIET AND ACTIVITY:  There were no restrictions placed on her diet or her  activity.                                               Carolanne Grumbling, M.D.    GT/MEDQ  D:  03/08/2003  T:  03/10/2003  Job:  [id number]

## 2010-05-29 NOTE — H&P (Signed)
[facility]  Patient:    [name], [name] Visit Number: [id number] MRN: [id number]          Service Type: DSU Location: [facility] Attending Physician:  Donne Hazel Dictated by:   Willey Blade, M.D. Proc. Date: 11/02/00 Admit Date:  11/02/2000                           History and Physical  HISTORY OF PRESENT ILLNESS:   Ms. Denison is a 45 year old female, gravida 2,k para 2, her husband has had a vasectomy for birth control measures. She is admitted at this time to undergo laparoscopy for acute pelvic pain on her left side. The patient has been followed for some six weeks now with a left ovarian cyst and this has persisted. She was seen in the office on October 22 where she requested treatment due to the severity of her discomfort. She has been on nonsteroidal anti-inflammatory agents and mild narcotic agents for this. After informed consent and the discussion of different treatment options, she has elected to undergo laparoscopy at this time.  PAST MEDICAL HISTORY:         (1) History of pulmonary embolism. (2) History of deep venous thrombosis. (3) History of asthma--stable. (4) History of Harrington rod placement.  PAST SURGICAL HISTORY:        (1) Back surgery. (2) History of left nephrectomy. (3) Normal spontaneous vaginal delivery x 2.  OBSTETRICAL HISTORY:          Normal spontaneous vaginal delivery x 2.  CURRENT MEDICATIONS:          Celebrex, Motrin, hydrocodone, and Demerol.  ALLERGIES:                    SEPTRA.  PHYSICAL EXAMINATION:  VITAL SIGNS:                  Vital signs stable, blood pressure 125/60, temperature 98.4, pulse 90, respirations 16.  GENERAL:                      Well-developed, well-nourished female in no acute distress.  HEENT:                        Within normal limits.  NECK:                         Supple without adenopathy or thyromegaly.  HEART:                        Regular rate and rhythm  without murmur, gallop, or rub.  LUNGS:                        Clear to auscultation.  BREASTS:                      Breast exam done recently in the office was normal but is deferred upon admission.  ABDOMEN:                      Soft and benign with only mild left lower quadrant tenderness, no rebound or peritoneal signs noted. There is no evidence of mass or hernia.  PELVIC:  Normal external female genitalia. Vagina and cervix clear. The uterus is small, nontender, and mobile. There is enlargement of the left adnexa with a noted mass about 6 cm. The right adnexa is clear of mass and pain.  EXTREMITIES:                  Grossly normal.  NEUROLOGIC:                   Grossly normal.  ADMITTING DIAGNOSES:          1. Left ovarian cyst.                               2. History of deep venous thrombosis with                                  subsequent pulmonary embolism.                               3. Status post left nephrectomy.  PLAN:                         Laparoscopy with left ovarian cystectomy.  DISCUSSION:                   The risk and benefits of the surgery were explained to the patient. The risk of bleeding, infection, risk of injury to surrounding organs was explained. Different treatment options were also discussed with the patient. She was allowed to ask questions and wished to proceed with surgery. Dictated by:   Willey Blade, M.D. Attending Physician:  Donne Hazel DD:  11/02/00 TD:  11/02/00 Job: [id number]

## 2010-05-29 NOTE — Discharge Summary (Signed)
NAMEIRENA, [name]              ACCOUNT NO.:  [id number]   MEDICAL RECORD NO.:  [id number]          PATIENT TYPE:  INP   LOCATION:  5710                         FACILITY:  [facility]   PHYSICIAN:  [name], M.D.    DATE OF BIRTH:  October 24, 1965   DATE OF ADMISSION:  08/11/2004  DATE OF DISCHARGE:  08/17/2004                                 DISCHARGE SUMMARY   DISCHARGE DIAGNOSES:  1.  Status post a fall from level ground.  2.  Left rib fractures x3 numbers 8, 9 and 10.  3.  Grade 1 to 2 splenic laceration.  4.  Upper respiratory tract infection treated with Avelox.  5.  History of bipolar disorder.  6.  History of EtOH use.  7.  Tobacco abuse.   BRIEF HISTORY:  This is a 45 year old female who was a patient of Dr. Brandt Loosen who fell on Saturday August 09, 2004 in her bathroom. She apparently  struck her left side against a porcelain tub. She presented to the emergency  room August 11, 2004 due to progressive chest pain and shortness of breath.   Workup at this time showed left rib fractures 8, 9 and 10 and a splenic  laceration grade 1 to 2 with a modest amount of blood in the left upper  quadrant. The patient was admitted for serial CBC and observation. She did  well following this, although she initially had an ileus and this gradually  improved. She had difficulty with pain control and was requiring intravenous  Dilaudid but this was able to be switched over to p.o. Tylox by the time of  discharge. Her ileus had resolved and she was tolerating a regular diet by  the time of discharge. She was ambulatory and having only modest abdominal  pain to palpation only. She had complete blood counts followed on a serial  basis with her hemoglobin stabilizing at 10.4, hematocrit 30.7 to 31.8.  Platelets are 437,000 on last check on August 16, 2004.   At this time, the patient is prepared for discharge.   DISCHARGE MEDICATIONS:  1.  Lamictal 75 milligrams p.o. q.d.  2.  Tylox one to two  p.o. q.4-6h. p.r.n. pain.   The patient is to follow up with [facility] on August 25, 2004 at 9:45  a.m. or sooner should she have any difficulties in the interim. She is to  follow up with [facility] as soon as possible to address  ongoing issues with her bipolar disease and continued alcohol use.      Shawn Rayburn, P.A.      [name], M.D.  Electronically Signed    SR/MEDQ  D:  08/17/2004  T:  08/17/2004  Job:  [id number]   cc:   [facility]   Medical Record   [facility]

## 2010-05-29 NOTE — Op Note (Signed)
NAMEROOPA, GRAVER              ACCOUNT NO.:  [id number]   MEDICAL RECORD NO.:  [id number]          PATIENT TYPE:  AMB   LOCATION:  DSC                          FACILITY:  [facility]   PHYSICIAN:  [name], M.D.    DATE OF BIRTH:  12-23-1965   DATE OF PROCEDURE:  12/21/2004  DATE OF DISCHARGE:                                 OPERATIVE REPORT   PREOPERATIVE DIAGNOSIS:  Left zygomatic sebaceous cyst.   POSTOPERATIVE DIAGNOSIS:  Left zygomatic sebaceous cyst.   PROCEDURE:  Excision of left zygomatic sebaceous.   SURGEON:  [name], M.D.   ANESTHESIA:  General with a laryngeal airway.   ESTIMATED BLOOD LOSS:  Less than 10 cc.   COMPLICATIONS:  None.   CONDITION:  Stable.   INDICATIONS FOR OPERATION:  The patient is a 45 year old with a left  sebaceous cyst and needs excision.   OPERATION:  The patient was taken to operating room, placed on the table in  supine position. After an adequate general laryngeal airway anesthetic was  administered, her left face was prepped and draped in the usual sterile  manner, exposing the zygomatic area.   The patient actually had 2 lesions right next to each other, a small punctum  just above the large area of the cyst. We included both in the area of  excision, which was an elliptical incision measuring approximately 4 cm in  total length and 2 cm in total width. We dissected out both areas and  removed the entire cyst cavity from the more inferior aspect, staying  superficially in the fatty tissue. There was no deep dissection.  We raised  a slight flap inferiorly and superiorly, in order to relieve tension on the  skin.  We obtained hemostasis with electrocautery using a needle point. Then  we reapproximated the skin in 2 layers; a deep 4-0 Vicryl layer and the skin  was running closed subcuticular 5-0 Monocryl. All needle counts, sponge  counts and instrument counts were correct. Steri-Strips and a sterile  dressing was  applied      [name], M.D.  Electronically Signed     JOW/MEDQ  D:  12/21/2004  T:  12/22/2004  Job:  [id number]
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
## 2010-05-29 NOTE — Discharge Summary (Signed)
NAMEAMARYA, [name]              ACCOUNT NO.:  [id number]   MEDICAL RECORD NO.:  [id number]          PATIENT TYPE:  IPS   LOCATION:  0302                          [facility]ACILITY:  [facility]   PHYSICIAN:  Anselm Jungling, MD  DATE OF BIRTH:  Dec 06, 1965   DATE OF ADMISSION:  06/27/2005  DATE OF DISCHARGE:  07/02/2005                                 DISCHARGE SUMMARY   IDENTIFYING DATA/REASON FOR ADMISSION:  The patient is a 45 year old single  African-American female admitted for treatment of depression and alcohol  abuse.  Prior to admission, she had had suicidal ideation with a plan to  overdose.  She reported that she had a history of bipolar disorder, with  previous instances of mania, although there were no indications of mania or  hypomania during this inpatient stay.  She had been on Lamictal therapy for  two months prior to admission.  She had recently relapsed on alcohol.  She  was going through a divorce.  Please refer to the admission note for further  details pertaining to the symptoms, circumstances and history that led to  her hospitalization.   INITIAL DIAGNOSTIC IMPRESSION:  She was given an initial AXIS I diagnosis of  mood disorder not otherwise specified, bipolar affective disorder by  history, and alcohol dependence.   MEDICAL/LABORATORY:  The patient was medically and physically assessed by  the psychiatric nurse practitioner.  She had a history of chronic pain and  was given lidocaine patch for this.  Aside from this, there were no  significant medical issues during this brief inpatient psychiatric stay.   HOSPITAL COURSE:  The patient was admitted to the adult inpatient  psychiatric service.  She presented as a well-nourished, well-developed,  pleasant, but sad African-American female who initially felt drowsy but,  despite this, appeared fairly alert, awake, and fully oriented.  Her  thoughts and speech were normally organized.  There were no signs or  symptoms of  psychosis or mania.  She denied any active suicidal ideation and  indicated that she wanted help and treatment.   The patient was continued on her previous dose of Lamictal 200 mg b.i.d.  In  addition, a trial of Risperdal was started, and low-dose was initially at  bedtime to help stabilize sleep and address anxiety.  Risperdal was helpful  at a dose of 2 mg q.h.s.  In addition, to address depression, she was begun  on a trial of Lexapro 5 mg daily.  Ambien 10 mg at h.s. was given as needed  for insomnia.   Over the course of her five-day stay, the patient gradually improved.  She  was initially somewhat tired and depressed, but felt better as each day  passed.  Her sleep improved and stabilized.  Her appetite was stable.  There  was consideration of a family meeting, but there did not appear to be any  appropriate parties available for such.   The patient was discharged on the fifth hospital day in good spirits,  sleeping well, and was absent suicidal ideation.   AFTERCARE:  The patient was to follow-up at the  [facility] with an  appointment on July 08, 2005.   DISCHARGE MEDICATIONS:  1.  Lamictal 200 mg b.i.d.  2.  Risperdal 2 mg q.h.s.  3.  Lexapro 5 mg daily.  4.  Ambien 10 mg q.h.s. p.r.n. insomnia.  5.  Lidocaine patch daily.   DISCHARGE DIAGNOSES:  AXIS I:  Bipolar affective disorder, by history,  currently depressed without psychotic features.  History of alcohol abuse.  AXIS II:  Deferred.  AXIS III:  No acute or chronic illnesses, chronic pain.  AXIS IV:  Stressors:  Severe.  AXIS V:  GAF on discharge 60.           ______________________________  Anselm Jungling, MD  Electronically Signed     SPB/MEDQ  D:  07/05/2005  T:  07/05/2005  Job:  [id number]

## 2010-05-29 NOTE — Op Note (Signed)
[facility]  Patient:    [name], [name] Visit Number: [id number] MRN: [id number]          Service Type: DSU Location: [facility] Attending Physician:  Donne Hazel Dictated by:   Willey Blade, M.D. Proc. Date: 11/02/00 Admit Date:  11/02/2000                             Operative Report  PREOPERATIVE DIAGNOSES:       1. Left ovarian cyst.                               2. Pelvic pain.  POSTOPERATIVE DIAGNOSES:      1. Left ovarian endometrioma.                               2. Left tubal endometriosis.                               3. Normal right adnexa.  OPERATION:                    1. Laparoscopy.                               2. Left salpingectomy.                               3. Left ovarian cystectomy.                               4. Ablation of endometriosis.  SURGEON:                      Willey Blade, M.D.  ANESTHESIA:                   General endotracheal anesthesia.  ESTIMATED BLOOD LOSS:         50 cc.  COMPLICATIONS:                None.  INTRAOPERATIVE FINDINGS:      A left ovarian endometrioma was encountered. This was about 4-5 cm in size. The left tube was dilated and full of endometriosis. A left salpingectomy was performed. There were also multiple small areas in the posterior pelvis and cul-de-sac of endometriosis which were ablated. The right adnexa was visualized and noted to be normal. The uterus was also normal.  DESCRIPTION OF PROCEDURE:     The patient was taken to the operating room where a general endotracheal anesthetic was administered. The patient was placed on the operating room table in the dorsal lithotomy position, the abdomen, perineum and vagina were prepped and draped in the usual sterile fashion with Betadine and sterile drapes. A Hulka tenaculum was introduced into the intrauterine cavity. Next, a small horizontal infraumbilical skin incision was made through which a Veress needle was  inserted atraumatically into the abdominal cavity. A pneumoperitoneum was then created with carbon dioxide gas until a pressure of 20 mmHg was reached of abdominal pressure. The Veress needle was then removed and a disposable laparoscopic trocar was  inserted atraumatically into the abdominal cavity. Next, an accessory incision was made two fingerbreadths above the pubic symphysis and in the midline. Through this a 5 mm laparoscopic trocar was inserted under direct, continuous laparoscopic guidance. The laparoscope was then inserted with the above noted findings. The left tube was removed with a cut and burn technique by thoroughly cauterizing the mesosalpinx and dissecting this too off the underlying cauterized mesosalpinx with laparoscopic scissors. The tube was then removed through the umbilical site trocar sheath. The ovarian endometrioma was opened and the base of this was thoroughly cauterized using the Bovie cautery. The pelvis was thoroughly washed with copious amounts of irrigant. The small areas of endometriosis were also ablated using the bipolar cautery. Good hemostasis was noted. Once again the pelvis was thoroughly irrigated and about 400 cc of Ringers lactate left inside the pelvis. Attention was then turned to closure. All endometriosis that was visualized was ablated. All abdominal instruments were removed and the gas allowed to completely escape from the abdomen. The two small incisions were closed first by placing two sutures of #0 Vicryl on the muscle fascia in the umbilical incision. The skin was then reapproximated with Dermabond glue. All vaginal instruments were removed. The patient was awakened, extubated and taken to the recovery room in good condition. There were no perioperative complications. Dictated by:   Willey Blade, M.D. Attending Physician:  Donne Hazel DD:  11/02/00 TD:  11/03/00 Job: [id number]

## 2010-06-26 ENCOUNTER — Inpatient Hospital Stay (INDEPENDENT_AMBULATORY_CARE_PROVIDER_SITE_OTHER)
Admission: RE | Admit: 2010-06-26 | Discharge: 2010-06-26 | Disposition: A | Discharge status: Home / Self Care | Payer: Self-pay | Source: Ambulatory Visit | Attending: Family Medicine | Admitting: Family Medicine

## 2010-06-26 DIAGNOSIS — S1093XA Contusion of unspecified part of neck, initial encounter: Secondary | ICD-10-CM

## 2010-08-14 ENCOUNTER — Other Ambulatory Visit (HOSPITAL_COMMUNITY)
Admission: RE | Admit: 2010-08-14 | Discharge: 2010-08-14 | Disposition: A | Discharge status: Home / Self Care | Payer: Medicare Other | Source: Ambulatory Visit | Attending: Family Medicine | Admitting: Family Medicine

## 2010-08-14 ENCOUNTER — Ambulatory Visit (INDEPENDENT_AMBULATORY_CARE_PROVIDER_SITE_OTHER): Payer: Medicare Other | Admitting: Family Medicine

## 2010-08-14 ENCOUNTER — Encounter: Payer: Self-pay | Admitting: Family Medicine

## 2010-08-14 DIAGNOSIS — N939 Abnormal uterine and vaginal bleeding, unspecified: Secondary | ICD-10-CM

## 2010-08-14 DIAGNOSIS — N760 Acute vaginitis: Secondary | ICD-10-CM

## 2010-08-14 DIAGNOSIS — Z524 Kidney donor: Secondary | ICD-10-CM

## 2010-08-14 DIAGNOSIS — F319 Bipolar disorder, unspecified: Secondary | ICD-10-CM

## 2010-08-14 DIAGNOSIS — Z124 Encounter for screening for malignant neoplasm of cervix: Secondary | ICD-10-CM | POA: Insufficient documentation

## 2010-08-14 DIAGNOSIS — N898 Other specified noninflammatory disorders of vagina: Secondary | ICD-10-CM

## 2010-08-14 LAB — POCT WET PREP (WET MOUNT): Trichomonas Wet Prep HPF POC: NEGATIVE

## 2010-08-14 NOTE — Patient Instructions (Addendum)
It has been a pleasure to met you. Please make a lab appointment for [facility] and [facility]. If results of your labs or tests we did today come back abnormal we will try to contact you by phone. If within normal limits we will mail you a letter. Make a f/u appointment with [place] in year but if needed sooner please contact our office. Please get information of your latest mamogram.

## 2010-08-15 LAB — GC/CHLAMYDIA PROBE AMP, GENITAL: Chlamydia, DNA Probe: NEGATIVE

## 2010-08-17 DIAGNOSIS — F319 Bipolar disorder, unspecified: Secondary | ICD-10-CM | POA: Insufficient documentation

## 2010-08-17 DIAGNOSIS — B9689 Other specified bacterial agents as the cause of diseases classified elsewhere: Secondary | ICD-10-CM | POA: Insufficient documentation

## 2010-08-17 NOTE — Progress Notes (Signed)
  Subjective:    Patient ID: [name], female    DOB: 12-05-1965, 45 y.o.   MRN: [id number]  HPI New patient to the clinic. She has history of being kidney donor (for her sister)  And suffer from Bipolar Disorder treated with Lamictal. She is compliant with her meds and this treatment is working for her for the last 7 years. She also has a history of endometriosis for which her left fallopian tube was removed. Today she complaints of amenorrhea since 2009 that she thought was menopause and now presents with painless irregular spotting. Not hot flushes or other symptoms. Also complaints about vaginal discharge that is white, non malodorous, non pruritic. Has a stable relationship with female partner and uses condom as contraception.  G2P2A0. First delivery was preterm and had what she mention as Pulmonary Edema as complication of her delivery.  Family history Positive for breast cancer (mother and maternal grandmother at age above 70's) Patient mention she has had 7 calcified nodules in one breast and f/u with yearly mammogram. Does not recall when the last one was performed but mention was negative for malignancy.  Review of Systems   Please refer to HPI Objective:   Physical Exam  Constitutional: She is oriented to person, place, and time. She appears well-developed and well-nourished.  HENT:  Head: Normocephalic and atraumatic.  Eyes: Pupils are equal, round, and reactive to light.  Neck: Neck supple. No thyromegaly present.  Cardiovascular: Normal rate, regular rhythm and normal heart sounds.   Pulmonary/Chest: Effort normal.  Abdominal: Soft. Bowel sounds are normal. She exhibits no distension and no mass. There is no tenderness.  Genitourinary: Vagina normal and uterus normal. No vaginal discharge found.        Odorless leukorrhea,  Cervix with normal appearance, no erythema.  Musculoskeletal: She exhibits no edema.  Lymphadenopathy:    She has no cervical adenopathy.    Neurological: She is alert and oriented to person, place, and time. No cranial nerve deficit.  Psychiatric: She has a normal mood and affect. Her behavior is normal. Judgment and thought content normal.          Assessment & Plan:

## 2010-08-18 ENCOUNTER — Encounter: Payer: Self-pay | Admitting: Family Medicine

## 2010-08-18 NOTE — Assessment & Plan Note (Signed)
On Lamictal and controlled for 7 years. Plan: Continue treatment.

## 2010-08-18 NOTE — Assessment & Plan Note (Signed)
Non pruritic odorless leukorrhea. Plan:  GC/C and also wet prep. Pending results to establish diagnosis and treatment.

## 2010-08-18 NOTE — Assessment & Plan Note (Signed)
Amenorrhea since 2009 now bleeding reported not present during visit.  Plan: Order [facility], pending result to evaluate amenorrhea/ bleeding

## 2010-08-18 NOTE — Assessment & Plan Note (Signed)
Kidney donor for her sister 79. Plan: [facility] to f/u creatinine and GFR

## 2010-08-20 ENCOUNTER — Telehealth: Payer: Self-pay | Admitting: Family Medicine

## 2010-08-20 ENCOUNTER — Encounter: Payer: Self-pay | Admitting: Family Medicine

## 2010-08-20 ENCOUNTER — Other Ambulatory Visit: Payer: Self-pay | Admitting: Family Medicine

## 2010-08-20 DIAGNOSIS — N898 Other specified noninflammatory disorders of vagina: Secondary | ICD-10-CM

## 2010-08-20 MED ORDER — METRONIDAZOLE 0.75 % VA GEL: 1.0000 | VAGINAL | Status: AC

## 2010-08-20 NOTE — Assessment & Plan Note (Signed)
Symptomatic vaginal discharge with wet prep positive for clue cells and cocci. Plan: Metronidazol 0.75%gel   5g vaginal QHS for 5 days.

## 2010-08-20 NOTE — Telephone Encounter (Signed)
Called the patient to inform wet prep result positive for Bacterial Vaginosis. She is still symptomatic.  Metronidazole as option is discussed. Patient agreed with vaginal route of Metronidazole. Medication is ordered.

## 2010-08-21 ENCOUNTER — Other Ambulatory Visit: Payer: Medicare Other

## 2010-08-21 DIAGNOSIS — N939 Abnormal uterine and vaginal bleeding, unspecified: Secondary | ICD-10-CM

## 2010-08-21 DIAGNOSIS — Z524 Kidney donor: Secondary | ICD-10-CM

## 2010-08-21 NOTE — Progress Notes (Signed)
cmp and fsh done today [name]

## 2010-08-22 LAB — COMPREHENSIVE METABOLIC PANEL
BUN: 17 mg/dL (ref 6–23)
CO2: 22 mEq/L (ref 19–32)
Calcium: 9.8 mg/dL (ref 8.4–10.5)
Chloride: 101 mEq/L (ref 96–112)
Creat: 0.98 mg/dL (ref 0.50–1.10)

## 2010-09-23 ENCOUNTER — Telehealth: Payer: Self-pay | Admitting: Family Medicine

## 2010-09-23 ENCOUNTER — Encounter: Payer: Self-pay | Admitting: Family Medicine

## 2010-09-23 ENCOUNTER — Ambulatory Visit (INDEPENDENT_AMBULATORY_CARE_PROVIDER_SITE_OTHER): Payer: Medicare Other | Admitting: Family Medicine

## 2010-09-23 VITALS — BP 149/85 | HR 111 | Temp 98.0°F | Ht 69.0 in | Wt 167.0 lb

## 2010-09-23 DIAGNOSIS — J069 Acute upper respiratory infection, unspecified: Secondary | ICD-10-CM

## 2010-09-23 MED ORDER — BENZONATATE 100 MG PO CAPS: 100.0000 mg | ORAL_CAPSULE | Freq: Three times a day (TID) | ORAL | Status: AC | PRN

## 2010-09-23 NOTE — Patient Instructions (Signed)
It was nice to meet you, I am sorry you do not feel good.  I feel that you have a cold, caused by a virus, and that antibiotics would not help you feel better.  This may last a week or two, and the cough may keep bothering you.  It is important to wash your hands a lot when you are sick.  I have sent a prescription for a cough medication to your pharmacy.

## 2010-09-23 NOTE — Progress Notes (Signed)
Subjective:     [name] is a 45 y.o. female who presents for evaluation of symptoms of a URI, possible sinusitis. Symptoms include achiness, congestion and cough described as productive. Onset of symptoms was 3 days ago, and has been stable since that time. Treatment to date: none.  The following portions of the patient's history were reviewed and updated as appropriate: allergies, current medications, past family history, past medical history, past social history, past surgical history and problem list.  Review of Systems Pertinent items are noted in HPI.   Objective:    BP 149/85  Pulse 111  Temp 98 F (36.7 C)  Ht 5\' 9"  (1.753 m)  Wt 167 lb (75.751 kg)  BMI 24.66 kg/m2 General appearance: alert, cooperative and no distress Eyes: conjunctivae/corneas clear. PERRL, EOM's intact. Fundi benign. Ears: normal TM's and external ear canals both ears Nose: Nares normal. Septum midline. Mucosa normal. No drainage or sinus tenderness. Throat: lips, mucosa, and tongue normal; teeth and gums normal Neck: no adenopathy, no carotid bruit, no JVD, supple, symmetrical, trachea midline and thyroid not enlarged, symmetric, no tenderness/mass/nodules Lungs: clear to auscultation bilaterally Heart: regular rate and rhythm, S1, S2 normal, no murmur, click, rub or gallop   Assessment:    viral upper respiratory illness   Plan:    Discussed diagnosis and treatment of URI. Discussed the importance of avoiding unnecessary antibiotic therapy. Suggested symptomatic OTC remedies. Nasal saline spray for congestion. Follow up as needed. Rx Tessalon pearls for cough supression.

## 2010-09-23 NOTE — Telephone Encounter (Signed)
Ms. Koc need another rx to replace the one for the Tessalon capsules.  Insurance want pay for the one given.   Please call her if there is any problem with request

## 2010-09-24 MED ORDER — GUAIFENESIN ER 600 MG PO TB12: 1200.0000 mg | ORAL_TABLET | Freq: Two times a day (BID) | ORAL | Status: DC

## 2010-09-24 NOTE — Telephone Encounter (Signed)
Will send to Dr. Chamberlain.  

## 2010-09-29 ENCOUNTER — Telehealth: Payer: Self-pay | Admitting: Family Medicine

## 2010-09-29 NOTE — Telephone Encounter (Signed)
Called [name], told her Mucinex had been called in on 9/12.  Also told her that she is allergic to Morphine, and many of the cough medications have Codeine in them so I cannot prescribe them for her.  Advised could try an OTC cough syrup, and if not feeling better in a week she should come in to be seen.

## 2010-09-29 NOTE — Telephone Encounter (Signed)
[name] called back to ask for another med she can get for there cough.  Medicaid will not pay for the one prescribed at there visit on 9/12

## 2010-10-02 LAB — COMPREHENSIVE METABOLIC PANEL
ALT: 18
AST: 19
Albumin: 3.8
Alkaline Phosphatase: 60
BUN: 13
Chloride: 101
GFR calc Af Amer: >60
Potassium: 4.1
Sodium: 140
Total Bilirubin: 0.6
Total Protein: 7.4

## 2010-10-02 LAB — URINE DRUGS OF ABUSE SCREEN W ALC, ROUTINE (REF LAB)
Amphetamine Screen, Ur: NEGATIVE
Cocaine Metabolites: NEGATIVE
Creatinine,U: 383.7
Marijuana Metabolite: NEGATIVE
Opiate Screen, Urine: NEGATIVE
Propoxyphene: NEGATIVE

## 2010-10-02 LAB — CBC
HCT: 38.6
Platelets: 284
RDW: 13.5
WBC: 13.5 — ABNORMAL HIGH

## 2010-10-02 LAB — URINALYSIS, ROUTINE W REFLEX MICROSCOPIC
Hgb urine dipstick: NEGATIVE
Leukocytes, UA: NEGATIVE
Nitrite: NEGATIVE
Protein, ur: 100 — AB
Specific Gravity, Urine: 1.041 — ABNORMAL HIGH
Urobilinogen, UA: 1

## 2010-10-02 LAB — URINE MICROSCOPIC-ADD ON

## 2011-01-22 ENCOUNTER — Ambulatory Visit: Payer: Medicare Other | Admitting: Family Medicine

## 2011-01-28 ENCOUNTER — Ambulatory Visit: Payer: Medicare Other

## 2011-01-28 ENCOUNTER — Ambulatory Visit (INDEPENDENT_AMBULATORY_CARE_PROVIDER_SITE_OTHER): Payer: Medicare Other | Admitting: Family Medicine

## 2011-01-28 ENCOUNTER — Ambulatory Visit: Payer: Medicare Other | Admitting: Family Medicine

## 2011-01-28 VITALS — BP 152/86 | HR 106 | Temp 98.6°F | Ht 70.0 in | Wt 170.0 lb

## 2011-01-28 DIAGNOSIS — R059 Cough, unspecified: Secondary | ICD-10-CM | POA: Diagnosis not present

## 2011-01-28 DIAGNOSIS — R05 Cough: Secondary | ICD-10-CM | POA: Insufficient documentation

## 2011-01-28 MED ORDER — DEXTROMETHORPHAN HBR 15 MG/5ML PO SYRP: 10.0000 mL | ORAL_SOLUTION | Freq: Four times a day (QID) | ORAL | Status: AC | PRN

## 2011-01-28 MED ORDER — CETIRIZINE HCL 10 MG PO TABS: 10.0000 mg | ORAL_TABLET | Freq: Every day | ORAL | Status: DC

## 2011-01-28 NOTE — Patient Instructions (Signed)
It was good to see you.  I am sorry you don't feel good.  I think you have a bad virus causing your cough.  Please rest, drink plenty of fluids, and take the cough medication as needed.

## 2011-01-28 NOTE — Assessment & Plan Note (Signed)
Feel this is URI with post-nasal drip.  Her lungs are clear and vitals signs normal, do not feel she has PNA. Rx for antitussives and zyrtec.

## 2011-01-28 NOTE — Progress Notes (Signed)
Patient ID: [name], female   DOB: March 09, 1965, 46 y.o.   MRN: [id number] Subjective:     [name] is a 46 y.o. female here for evaluation of a cough. Onset of symptoms was 10 days ago. Symptoms have been gradually worsening since that time. The cough is productive of white sputum and is aggravated by exercise. Associated symptoms include: postnasal drip and sputum production. Patient does not have a history of asthma. Patient does not have a history of environmental allergens. Patient has not traveled recently. Patient does have a history of smoking. She says that over the weekend she took some of her sisters zyrtec, which helped her cough.  She has not had fevers or chills.  The patient has had pneumonia in the past, and got the pneumococcal shot after that illness.  She does say she was worried about pneumonia, but says she does not feel as bad as when she was sick with pneumonia.  She has not smoked in 5 days due to her cough.    The following portions of the patient's history were reviewed and updated as appropriate: allergies, current medications, past family history, past medical history, past social history, past surgical history and problem list.  Review of Systems Pertinent items are noted in HPI.    Objective:   BP 152/86  Pulse 106  Temp(Src) 98.6 F (37 C) (Oral)  Ht 5\' 10"  (1.778 m)  Wt 170 lb (77.111 kg)  BMI 24.39 kg/m2 General appearance: alert, cooperative and no distress Eyes: conjunctivae/corneas clear. PERRL, EOM's intact. Fundi benign. Ears: normal TM's and external ear canals both ears Nose: Nares normal. Septum midline. Mucosa normal. No drainage or sinus tenderness. Throat: lips, mucosa, and tongue normal; teeth and gums normal Neck: no adenopathy, no JVD, supple, symmetrical, trachea midline and thyroid not enlarged, symmetric, no tenderness/mass/nodules Lungs: clear to auscultation bilaterally Heart: regular rate and rhythm, S1, S2 normal, no murmur,  click, rub or gallop Abdomen: soft, non-tender; bowel sounds normal; no masses,  no organomegaly Extremities: extremities normal, atraumatic, no cyanosis or edema    Assessment:    URI with Post Nasal Drip    Plan:    Explained lack of efficacy of antibiotics in viral disease. Antitussives per medication orders. Avoid exposure to tobacco smoke and fumes. Call if shortness of breath worsens, blood in sputum, change in character of cough, development of fever or chills, inability to maintain nutrition and hydration. Avoid exposure to tobacco smoke and fumes. Trial of antihistamines.

## 2011-02-08 ENCOUNTER — Other Ambulatory Visit: Payer: Self-pay | Admitting: Family Medicine

## 2011-02-08 DIAGNOSIS — Z1231 Encounter for screening mammogram for malignant neoplasm of breast: Secondary | ICD-10-CM

## 2011-02-23 ENCOUNTER — Ambulatory Visit: Payer: Medicare Other

## 2011-03-05 ENCOUNTER — Ambulatory Visit
Admission: RE | Admit: 2011-03-05 | Discharge: 2011-03-05 | Disposition: A | Discharge status: Home / Self Care | Payer: Medicare Other | Source: Ambulatory Visit | Attending: Family Medicine | Admitting: Family Medicine

## 2011-03-05 DIAGNOSIS — Z1231 Encounter for screening mammogram for malignant neoplasm of breast: Secondary | ICD-10-CM | POA: Diagnosis not present

## 2011-03-12 ENCOUNTER — Other Ambulatory Visit: Payer: Self-pay | Admitting: *Deleted

## 2011-03-12 DIAGNOSIS — F319 Bipolar disorder, unspecified: Secondary | ICD-10-CM

## 2011-03-12 MED ORDER — LAMOTRIGINE 200 MG PO TABS: 200.0000 mg | ORAL_TABLET | Freq: Every day | ORAL | Status: DC

## 2011-03-25 ENCOUNTER — Telehealth: Payer: Self-pay | Admitting: Family Medicine

## 2011-05-11 DIAGNOSIS — M5137 Other intervertebral disc degeneration, lumbosacral region: Secondary | ICD-10-CM | POA: Diagnosis not present

## 2011-05-27 ENCOUNTER — Telehealth: Payer: Self-pay | Admitting: Family Medicine

## 2011-05-27 MED ORDER — TRAZODONE HCL 100 MG PO TABS: 100.0000 mg | ORAL_TABLET | Freq: Every day | ORAL | Status: DC

## 2011-05-27 NOTE — Telephone Encounter (Signed)
Sent refill of trazadone to pharmacy electronically.  Will need a follow up with Dr. Aviva Signs as this medication has not been discussed her in previous visits.

## 2011-05-27 NOTE — Telephone Encounter (Signed)
Pt out of her Trazadone.  Need refill today if possible.  Not sleeping at all.

## 2011-05-27 NOTE — Telephone Encounter (Signed)
Paged Dr. Aviva Signs x 2.  No response.  Will page Dr. Elwyn Reach and route phone note for refill request.  Gaylene Brooks, RN

## 2011-05-27 NOTE — Telephone Encounter (Signed)
Patient informed med was refilled.  Follow-up appt scheduled with Dr. Aviva Signs for 06/14/11 @ 11:00am.  Gaylene Brooks, RN

## 2011-06-14 ENCOUNTER — Ambulatory Visit: Payer: Medicare Other | Admitting: Family Medicine

## 2011-06-16 ENCOUNTER — Encounter: Payer: Self-pay | Admitting: Family Medicine

## 2011-06-16 ENCOUNTER — Ambulatory Visit (INDEPENDENT_AMBULATORY_CARE_PROVIDER_SITE_OTHER): Payer: Medicare Other | Admitting: Family Medicine

## 2011-06-16 VITALS — BP 124/78 | HR 98 | Temp 97.1°F | Ht 70.0 in | Wt 167.8 lb

## 2011-06-16 DIAGNOSIS — F319 Bipolar disorder, unspecified: Secondary | ICD-10-CM | POA: Diagnosis not present

## 2011-06-16 DIAGNOSIS — T300 Burn of unspecified body region, unspecified degree: Secondary | ICD-10-CM

## 2011-06-16 DIAGNOSIS — Z720 Tobacco use: Secondary | ICD-10-CM

## 2011-06-16 DIAGNOSIS — F17200 Nicotine dependence, unspecified, uncomplicated: Secondary | ICD-10-CM

## 2011-06-16 MED ORDER — LAMOTRIGINE 200 MG PO TABS: 200.0000 mg | ORAL_TABLET | Freq: Every day | ORAL | Status: DC

## 2011-06-16 MED ORDER — TRAZODONE HCL 100 MG PO TABS: 100.0000 mg | ORAL_TABLET | Freq: Every day | ORAL | Status: DC

## 2011-06-16 NOTE — Patient Instructions (Addendum)
It has been a pleasure to see you today. You can apply Mederma twice a day on the scar area. Apply sun screen before you go outdoors. Make an appointment with smoking cessation clinic with Dr. Raymondo Band. Make your next appointment with me in August or sooner if needed.

## 2011-06-16 NOTE — Assessment & Plan Note (Signed)
Pt is 3/4PPD and enthusiastic about quitting. Plan: Positive reinforcement about her decision. Excellent candidate for our smoking cessation clinic with Dr. Raymondo Band. Recommended to make an appointment with pharmacy clinic.
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
## 2011-06-16 NOTE — Assessment & Plan Note (Signed)
Controlled on Lamotrigine 200 mg daily. No signs of secondary effects Plan: I refilled her medication. Pt will continue with psychology/psychiatry f/u.  Her next appointment with me will be in August/2013.

## 2011-07-13 ENCOUNTER — Encounter: Payer: Self-pay | Admitting: Family Medicine

## 2011-07-13 NOTE — Telephone Encounter (Signed)
This encounter was created in error - please disregard.

## 2011-07-13 NOTE — Telephone Encounter (Signed)
Patient wants to speak to the Triage nurse about Ear Wax Removal.

## 2011-07-20 ENCOUNTER — Ambulatory Visit: Payer: Medicare Other | Admitting: Pharmacist

## 2011-07-27 ENCOUNTER — Ambulatory Visit: Payer: Medicare Other | Admitting: Pharmacist

## 2011-08-16 ENCOUNTER — Other Ambulatory Visit: Payer: Self-pay | Admitting: Family Medicine

## 2011-08-19 ENCOUNTER — Ambulatory Visit: Payer: Medicare Other | Admitting: Pharmacist

## 2011-10-06 ENCOUNTER — Ambulatory Visit: Payer: Medicare Other | Admitting: Family Medicine

## 2011-10-07 ENCOUNTER — Encounter: Payer: Self-pay | Admitting: Family Medicine

## 2011-10-07 ENCOUNTER — Ambulatory Visit (INDEPENDENT_AMBULATORY_CARE_PROVIDER_SITE_OTHER): Payer: Medicare Other | Admitting: Family Medicine

## 2011-10-07 VITALS — BP 143/84 | HR 103 | Temp 97.1°F | Ht 70.0 in | Wt 177.0 lb

## 2011-10-07 DIAGNOSIS — Z720 Tobacco use: Secondary | ICD-10-CM | POA: Insufficient documentation

## 2011-10-07 DIAGNOSIS — J069 Acute upper respiratory infection, unspecified: Secondary | ICD-10-CM

## 2011-10-07 DIAGNOSIS — F17200 Nicotine dependence, unspecified, uncomplicated: Secondary | ICD-10-CM

## 2011-10-07 DIAGNOSIS — Z23 Encounter for immunization: Secondary | ICD-10-CM

## 2011-10-07 MED ORDER — ALBUTEROL SULFATE HFA 108 (90 BASE) MCG/ACT IN AERS: 2.0000 | INHALATION_SPRAY | Freq: Four times a day (QID) | RESPIRATORY_TRACT | Status: DC | PRN

## 2011-10-07 NOTE — Assessment & Plan Note (Signed)
Discussed supportive care, OTC meds, and red flags for return.  She requests albuterol for wheezing.  Will rx, advised if continues to need to use or has recurrent URI's may consider spirometry for COPD.

## 2011-10-07 NOTE — Assessment & Plan Note (Signed)
Doing well cutting back.. Linked uri with smoking. She self asked about seeing the smoking counselor she had heard about we had available before.  Gave her info to schedule with Dr. Raymondo Band.

## 2011-10-07 NOTE — Progress Notes (Signed)
  Subjective:    Patient ID: [name], female    DOB: [birth year], 46 y.o.   MRN: [id number]  HPI  4 days of cough, headache, wheezing, nasal congestion.  No dyspnea except when walking up 3 flights of stairs at baseline.  Is a smoker- wearing a nicotine patch and usuing an e-vapor cigarettes.  States has cut down half to about a half pack per day.  No fever, nausea diarrhea.    History of needing supplemental oxygen after a hospitalization for pneumonia in the remote past. Review of Systems See HPI    Objective:   Physical Exam  GEN: Alert & Oriented, No acute distress HEENT: [place]/AT. EOMI, PERRLA, no conjunctival injection or scleral icterus.  Bilateral tympanic membranes intact without erythema or effusion.  .  Nares without edema or rhinorrhea.  Oropharynx is without erythema or exudates.  No anterior or posterior cervical lymphadenopathy. CV:  Regular Rate & Rhythm, no murmur Respiratory:  Normal work of breathing, CTAB Abd:  + BS, soft, no tenderness to palpation Ext: no pre-tibial edema       Assessment & Plan:

## 2011-10-07 NOTE — Patient Instructions (Addendum)
If you notice fever, chills, or don't get better after 10 days, follow-up appointment  Ask pharmacist for robitussin(dextromethorphan) with mucinex(guiafenesin)  to help with cough   Ask front desk to make appointment with Dr. Raymondo Band for smoking counseling  Make appointment for next available with your primary Dr. Aviva Signs     Upper Respiratory Infection, Adult An upper respiratory infection (URI) is also sometimes known as the common cold. The upper respiratory tract includes the nose, sinuses, throat, trachea, and bronchi. Bronchi are the airways leading to the lungs. Most people improve within 1 week, but symptoms can last up to 2 weeks. A residual cough may last even longer.   CAUSES Many different viruses can infect the tissues lining the upper respiratory tract. The tissues become irritated and inflamed and often become very moist. Mucus production is also common. A cold is contagious. You can easily spread the virus to others by oral contact. This includes kissing, sharing a glass, coughing, or sneezing. Touching your mouth or nose and then touching a surface, which is then touched by another person, can also spread the virus. SYMPTOMS   Symptoms typically develop 1 to 3 days after you come in contact with a cold virus. Symptoms vary from person to person. They may include:  Runny nose.   Sneezing.   Nasal congestion.   Sinus irritation.   Sore throat.   Loss of voice (laryngitis).   Cough.   Fatigue.   Muscle aches.   Loss of appetite.   Headache.   Low-grade fever.  DIAGNOSIS   You might diagnose your own cold based on familiar symptoms, since most people get a cold 2 to 3 times a year. Your caregiver can confirm this based on your exam. Most importantly, your caregiver can check that your symptoms are not due to another disease such as strep throat, sinusitis, pneumonia, asthma, or epiglottitis. Blood tests, throat tests, and X-rays are not necessary to diagnose a  common cold, but they may sometimes be helpful in excluding other more serious diseases. Your caregiver will decide if any further tests are required. RISKS AND COMPLICATIONS   You may be at risk for a more severe case of the common cold if you smoke cigarettes, have chronic heart disease (such as heart failure) or lung disease (such as asthma), or if you have a weakened immune system. The very young and very old are also at risk for more serious infections. Bacterial sinusitis, middle ear infections, and bacterial pneumonia can complicate the common cold. The common cold can worsen asthma and chronic obstructive pulmonary disease (COPD). Sometimes, these complications can require emergency medical care and may be life-threatening. PREVENTION   The best way to protect against getting a cold is to practice good hygiene. Avoid oral or hand contact with people with cold symptoms. Wash your hands often if contact occurs. There is no clear evidence that vitamin C, vitamin E, echinacea, or exercise reduces the chance of developing a cold. However, it is always recommended to get plenty of rest and practice good nutrition. TREATMENT   Treatment is directed at relieving symptoms. There is no cure. Antibiotics are not effective, because the infection is caused by a virus, not by bacteria. Treatment may include:  Increased fluid intake. Sports drinks offer valuable electrolytes, sugars, and fluids.   Breathing heated mist or steam (vaporizer or shower).   Eating chicken soup or other clear broths, and maintaining good nutrition.   Getting plenty of rest.   Using gargles  or lozenges for comfort.   Controlling fevers with ibuprofen or acetaminophen as directed by your caregiver.   Increasing usage of your inhaler if you have asthma.  Zinc gel and zinc lozenges, taken in the first 24 hours of the common cold, can shorten the duration and lessen the severity of symptoms. Pain medicines may help with fever,  muscle aches, and throat pain. A variety of non-prescription medicines are available to treat congestion and runny nose. Your caregiver can make recommendations and may suggest nasal or lung inhalers for other symptoms.   HOME CARE INSTRUCTIONS    Only take over-the-counter or prescription medicines for pain, discomfort, or fever as directed by your caregiver.   Use a warm mist humidifier or inhale steam from a shower to increase air moisture. This may keep secretions moist and make it easier to breathe.   Drink enough water and fluids to keep your urine clear or pale yellow.   Rest as needed.   Return to work when your temperature has returned to normal or as your caregiver advises. You may need to stay home longer to avoid infecting others. You can also use a face mask and careful hand washing to prevent spread of the virus.  SEEK MEDICAL CARE IF:    After the first few days, you feel you are getting worse rather than better.   You need your caregiver's advice about medicines to control symptoms.   You develop chills, worsening shortness of breath, or brown or red sputum. These may be signs of pneumonia.   You develop yellow or brown nasal discharge or pain in the face, especially when you bend forward. These may be signs of sinusitis.   You develop a fever, swollen neck glands, pain with swallowing, or white areas in the back of your throat. These may be signs of strep throat.  SEEK IMMEDIATE MEDICAL CARE IF:    You have a fever.   You develop severe or persistent headache, ear pain, sinus pain, or chest pain.   You develop wheezing, a prolonged cough, cough up blood, or have a change in your usual mucus (if you have chronic lung disease).   You develop sore muscles or a stiff neck.  Document Released: 06/23/2000 Document Revised: 12/17/2010 Document Reviewed: 05/01/2010 [facility] Patient Information 2012 [place], [place].

## 2011-10-22 ENCOUNTER — Other Ambulatory Visit: Payer: Self-pay | Admitting: Family Medicine

## 2011-10-22 NOTE — Telephone Encounter (Signed)
Forwarded to pcp.[name]  

## 2011-10-22 NOTE — Telephone Encounter (Signed)
Patient is calling about the correction to the refill on her Trazadone.  She is completely out and needs this for tonight.

## 2011-10-22 NOTE — Telephone Encounter (Signed)
Prescription approved and sent

## 2011-10-28 ENCOUNTER — Encounter: Payer: Self-pay | Admitting: Family Medicine

## 2011-10-28 ENCOUNTER — Ambulatory Visit (INDEPENDENT_AMBULATORY_CARE_PROVIDER_SITE_OTHER): Payer: Medicare Other | Admitting: Family Medicine

## 2011-10-28 VITALS — BP 143/79 | HR 114 | Temp 98.2°F | Ht 70.0 in | Wt 177.5 lb

## 2011-10-28 DIAGNOSIS — J069 Acute upper respiratory infection, unspecified: Secondary | ICD-10-CM

## 2011-10-28 DIAGNOSIS — F17200 Nicotine dependence, unspecified, uncomplicated: Secondary | ICD-10-CM | POA: Diagnosis not present

## 2011-10-28 DIAGNOSIS — Z720 Tobacco use: Secondary | ICD-10-CM

## 2011-10-28 NOTE — Progress Notes (Signed)
  Subjective:    Patient ID: [name], female    DOB: [birth year], 46 y.o.   MRN: [id number]  HPI Pt comes today for her pap smear but declined procedure and would like to re-eschedule it. She wanted to be evaluated today for respiratory symptoms of 2 days duration. She reports nasal congestion and general malaise. Denies fever, nausea or vomiting. No cough or SOB. Normal BM. She stated that quit smoking 2 weeks ago. Reports been vaccinated (flu) about 3 weeks ago. No history of sick contact that she remembers.  Review of Systems Per HPI    Objective:   Physical Exam Gen:  NAD HEENT: Moist mucous membranes. Erythematous oropharynx, no exudates. No cervical adenopathies. CV: Regular rate and rhythm. PULM: Clear to auscultation bilaterally. No wheezes/rales/rhonchi ABD: Soft, non tender, non distended, normal bowel sounds EXT: No edema Neuro: Alert and oriented x3. No focalization.     Assessment & Plan:

## 2011-10-28 NOTE — Assessment & Plan Note (Signed)
Started 2 days ago. Mostly nasal congestion. No  Fevers.  Plan: Afrin for congestion. Recommended to use it only for severe congestion and no more than 3 days.  Keep good hydration.  symptomatic treatment with tylenol.  Red flags discussed, pt agreeable to get re-evaluated if symptoms worsens or new one appear. Positive reinforcement with quitting smoking.

## 2011-10-28 NOTE — Patient Instructions (Addendum)
Congratulations in quitting smoking!!! Upper Respiratory Infection, Adult An upper respiratory infection (URI) is also sometimes known as the common cold. The upper respiratory tract includes the nose, sinuses, throat, trachea, and bronchi. Bronchi are the airways leading to the lungs. Most people improve within 1 week, but symptoms can last up to 2 weeks. A residual cough may last even longer.  CAUSES Many different viruses can infect the tissues lining the upper respiratory tract. The tissues become irritated and inflamed and often become very moist. Mucus production is also common. A cold is contagious. You can easily spread the virus to others by oral contact. This includes kissing, sharing a glass, coughing, or sneezing. Touching your mouth or nose and then touching a surface, which is then touched by another person, can also spread the virus. SYMPTOMS  Symptoms typically develop 1 to 3 days after you come in contact with a cold virus. Symptoms vary from person to person. They may include:  Runny nose.  Sneezing.  Nasal congestion.  Sinus irritation.  Sore throat.  Loss of voice (laryngitis).  Cough.  Fatigue.  Muscle aches.  Loss of appetite.  Headache.  Low-grade fever. DIAGNOSIS  You might diagnose your own cold based on familiar symptoms, since most people get a cold 2 to 3 times a year. Your caregiver can confirm this based on your exam. Most importantly, your caregiver can check that your symptoms are not due to another disease such as strep throat, sinusitis, pneumonia, asthma, or epiglottitis. Blood tests, throat tests, and X-rays are not necessary to diagnose a common cold, but they may sometimes be helpful in excluding other more serious diseases. Your caregiver will decide if any further tests are required. RISKS AND COMPLICATIONS  You may be at risk for a more severe case of the common cold if you smoke cigarettes, have chronic heart disease (such as heart  failure) or lung disease (such as asthma), or if you have a weakened immune system. The very young and very old are also at risk for more serious infections. Bacterial sinusitis, middle ear infections, and bacterial pneumonia can complicate the common cold. The common cold can worsen asthma and chronic obstructive pulmonary disease (COPD). Sometimes, these complications can require emergency medical care and may be life-threatening. PREVENTION  The best way to protect against getting a cold is to practice good hygiene. Avoid oral or hand contact with people with cold symptoms. Wash your hands often if contact occurs. There is no clear evidence that vitamin C, vitamin E, echinacea, or exercise reduces the chance of developing a cold. However, it is always recommended to get plenty of rest and practice good nutrition. TREATMENT  Treatment is directed at relieving symptoms. There is no cure. Antibiotics are not effective, because the infection is caused by a virus, not by bacteria. Treatment may include:  Increased fluid intake. Sports drinks offer valuable electrolytes, sugars, and fluids.  Breathing heated mist or steam (vaporizer or shower).  Eating chicken soup or other clear broths, and maintaining good nutrition.  Getting plenty of rest.  Using gargles or lozenges for comfort.  Controlling fevers with ibuprofen or acetaminophen as directed by your caregiver.  Increasing usage of your inhaler if you have asthma. Zinc gel and zinc lozenges, taken in the first 24 hours of the common cold, can shorten the duration and lessen the severity of symptoms. Pain medicines may help with fever, muscle aches, and throat pain. A variety of non-prescription medicines are available to treat congestion and  runny nose. Your caregiver can make recommendations and may suggest nasal or lung inhalers for other symptoms.  HOME CARE INSTRUCTIONS   Only take over-the-counter or prescription medicines for pain,  discomfort, or fever as directed by your caregiver.  Use a warm mist humidifier or inhale steam from a shower to increase air moisture. This may keep secretions moist and make it easier to breathe.  Drink enough water and fluids to keep your urine clear or pale yellow.  Rest as needed.  Return to work when your temperature has returned to normal or as your caregiver advises. You may need to stay home longer to avoid infecting others. You can also use a face mask and careful hand washing to prevent spread of the virus. SEEK MEDICAL CARE IF:   After the first few days, you feel you are getting worse rather than better.  You need your caregiver's advice about medicines to control symptoms.  You develop chills, worsening shortness of breath, or brown or red sputum. These may be signs of pneumonia.  You develop yellow or brown nasal discharge or pain in the face, especially when you bend forward. These may be signs of sinusitis.  You develop a fever, swollen neck glands, pain with swallowing, or white areas in the back of your throat. These may be signs of strep throat. SEEK IMMEDIATE MEDICAL CARE IF:   You have a fever.  You develop severe or persistent headache, ear pain, sinus pain, or chest pain.  You develop wheezing, a prolonged cough, cough up blood, or have a change in your usual mucus (if you have chronic lung disease).  You develop sore muscles or a stiff neck. Document Released: 06/23/2000 Document Revised: 03/22/2011 Document Reviewed: 05/01/2010 [facility] Patient Information 2013 [place], [place].

## 2011-11-11 ENCOUNTER — Encounter: Payer: Self-pay | Admitting: Family Medicine

## 2011-11-11 ENCOUNTER — Ambulatory Visit (INDEPENDENT_AMBULATORY_CARE_PROVIDER_SITE_OTHER): Payer: Medicare Other | Admitting: Family Medicine

## 2011-11-11 VITALS — BP 129/71 | HR 95 | Temp 97.7°F | Wt 175.6 lb

## 2011-11-11 DIAGNOSIS — Z940 Kidney transplant status: Secondary | ICD-10-CM

## 2011-11-11 DIAGNOSIS — E049 Nontoxic goiter, unspecified: Secondary | ICD-10-CM

## 2011-11-11 DIAGNOSIS — F319 Bipolar disorder, unspecified: Secondary | ICD-10-CM | POA: Diagnosis not present

## 2011-11-11 NOTE — Patient Instructions (Addendum)
It has been a pleasure to see you today. I will call you with the labs results if they come back abnormal otherwise we will discuss them at your next appointment  Make your next appointment in 3- 4 months.

## 2011-11-12 NOTE — Assessment & Plan Note (Signed)
Stable.  Plan: Continue current treatment and dose.

## 2011-11-12 NOTE — Progress Notes (Signed)
Family Medicine Office Visit Note   Subjective:   Patient ID: [name], female  DOB: 07/13/1965, 46 y.o.. MRN: [id number]   Pt that comes today for her annual exam. She has not current complaints. Her URI resolved with symptomatic treatment.  1. Bipolar 1 disorder: on Lamictal 200 mg. She reports compliance and stable mood. Denies side effects noted.   Review of Systems:  Pt denies SOB, chest pain, palpitations, headaches, dizziness, numbness or weakness. No changes on urinary or BM habits. No unintentional weigh loss/gain.  Health maintenance Pap smear in 2015 HTN screening: two episodes of systolic over 140 and also tachycardic. Both related to acute sickness. Normal BP today. Breast CA screening: recommended annual mammography.   Objective:   Physical Exam: Gen:  NAD HEENT: Moist mucous membranes. Mild diffuse goiter.  CV: Regular rate and rhythm, no murmurs rubs or gallops PULM: Clear to auscultation bilaterally. No wheezes/rales/rhonchi ABD: Soft, non tender, non distended, normal bowel sounds EXT: No edema Neuro: Alert and oriented x3. No focalization Pelvic exam declined after explained that pap smear guidelines are every  three years on pts without abnormal pap smear. Last done was 2012   Assessment & Plan:

## 2011-11-16 ENCOUNTER — Other Ambulatory Visit: Payer: Medicare Other

## 2011-11-16 DIAGNOSIS — Z940 Kidney transplant status: Secondary | ICD-10-CM

## 2011-11-16 DIAGNOSIS — E049 Nontoxic goiter, unspecified: Secondary | ICD-10-CM | POA: Diagnosis not present

## 2011-11-16 LAB — CBC
HCT: 37.6 % (ref 36.0–46.0)
MCH: 27.2 pg (ref 26.0–34.0)
MCV: 79.2 fL (ref 78.0–100.0)
Platelets: 318 10*3/uL (ref 150–400)
RDW: 14.1 % (ref 11.5–15.5)
WBC: 8.3 10*3/uL (ref 4.0–10.5)

## 2011-11-16 LAB — COMPREHENSIVE METABOLIC PANEL
ALT: 12 U/L (ref 0–35)
Albumin: 3.7 g/dL (ref 3.5–5.2)
Alkaline Phosphatase: 56 U/L (ref 39–117)
CO2: 25 mEq/L (ref 19–32)
Potassium: 4.3 mEq/L (ref 3.5–5.3)
Sodium: 140 mEq/L (ref 135–145)
Total Bilirubin: 0.5 mg/dL (ref 0.3–1.2)
Total Protein: 6.8 g/dL (ref 6.0–8.3)

## 2011-11-16 LAB — LIPID PANEL
HDL: 52 mg/dL (ref >39)
LDL Cholesterol: 116 mg/dL — ABNORMAL HIGH (ref 0–99)

## 2011-11-16 NOTE — Progress Notes (Signed)
FLP,CMP,TSH AND CBC DONE TODAY [name]

## 2011-12-27 ENCOUNTER — Other Ambulatory Visit: Payer: Self-pay | Admitting: Family Medicine

## 2012-01-28 ENCOUNTER — Other Ambulatory Visit: Payer: Self-pay | Admitting: Family Medicine

## 2012-01-28 DIAGNOSIS — Z1231 Encounter for screening mammogram for malignant neoplasm of breast: Secondary | ICD-10-CM

## 2012-02-28 ENCOUNTER — Other Ambulatory Visit: Payer: Self-pay | Admitting: Family Medicine

## 2012-03-06 ENCOUNTER — Ambulatory Visit: Payer: Medicare Other

## 2012-03-21 ENCOUNTER — Ambulatory Visit: Payer: Medicare Other

## 2012-04-11 ENCOUNTER — Ambulatory Visit: Payer: Medicare Other

## 2012-04-24 ENCOUNTER — Other Ambulatory Visit: Payer: Self-pay | Admitting: Family Medicine

## 2012-05-09 ENCOUNTER — Ambulatory Visit
Admission: RE | Admit: 2012-05-09 | Discharge: 2012-05-09 | Disposition: A | Discharge status: Home / Self Care | Payer: Medicare Other | Source: Ambulatory Visit | Attending: Family Medicine | Admitting: Family Medicine

## 2012-05-09 DIAGNOSIS — Z1231 Encounter for screening mammogram for malignant neoplasm of breast: Secondary | ICD-10-CM | POA: Diagnosis not present

## 2012-06-22 ENCOUNTER — Other Ambulatory Visit: Payer: Self-pay | Admitting: *Deleted

## 2012-06-22 DIAGNOSIS — F319 Bipolar disorder, unspecified: Secondary | ICD-10-CM

## 2012-06-23 ENCOUNTER — Other Ambulatory Visit: Payer: Self-pay | Admitting: *Deleted

## 2012-06-23 DIAGNOSIS — F319 Bipolar disorder, unspecified: Secondary | ICD-10-CM

## 2012-06-26 ENCOUNTER — Other Ambulatory Visit: Payer: Self-pay | Admitting: Family Medicine

## 2012-06-27 MED ORDER — LAMOTRIGINE 200 MG PO TABS: 200.0000 mg | ORAL_TABLET | Freq: Every day | ORAL | Status: DC

## 2012-06-28 ENCOUNTER — Other Ambulatory Visit: Payer: Self-pay | Admitting: Family Medicine

## 2012-06-28 MED ORDER — TRAZODONE HCL 100 MG PO TABS: ORAL_TABLET | ORAL | Status: DC

## 2012-07-25 ENCOUNTER — Other Ambulatory Visit: Payer: Self-pay | Admitting: *Deleted

## 2012-07-25 DIAGNOSIS — F319 Bipolar disorder, unspecified: Secondary | ICD-10-CM

## 2012-07-25 MED ORDER — LAMOTRIGINE 200 MG PO TABS: 200.0000 mg | ORAL_TABLET | Freq: Every day | ORAL | Status: DC

## 2012-09-15 DIAGNOSIS — M79609 Pain in unspecified limb: Secondary | ICD-10-CM | POA: Diagnosis not present

## 2012-09-15 DIAGNOSIS — M546 Pain in thoracic spine: Secondary | ICD-10-CM | POA: Diagnosis not present

## 2012-09-15 DIAGNOSIS — M47817 Spondylosis without myelopathy or radiculopathy, lumbosacral region: Secondary | ICD-10-CM | POA: Diagnosis not present

## 2012-09-15 DIAGNOSIS — M545 Low back pain: Secondary | ICD-10-CM | POA: Diagnosis not present

## 2012-09-19 DIAGNOSIS — M546 Pain in thoracic spine: Secondary | ICD-10-CM | POA: Diagnosis not present

## 2012-09-19 DIAGNOSIS — M545 Low back pain: Secondary | ICD-10-CM | POA: Diagnosis not present

## 2012-09-19 DIAGNOSIS — IMO0002 Reserved for concepts with insufficient information to code with codable children: Secondary | ICD-10-CM | POA: Diagnosis not present

## 2012-10-13 DIAGNOSIS — L702 Acne varioliformis: Secondary | ICD-10-CM | POA: Diagnosis not present

## 2012-10-13 DIAGNOSIS — L708 Other acne: Secondary | ICD-10-CM | POA: Diagnosis not present

## 2012-10-23 DIAGNOSIS — H04129 Dry eye syndrome of unspecified lacrimal gland: Secondary | ICD-10-CM | POA: Diagnosis not present

## 2012-10-23 DIAGNOSIS — H0019 Chalazion unspecified eye, unspecified eyelid: Secondary | ICD-10-CM | POA: Diagnosis not present

## 2012-10-23 DIAGNOSIS — H01009 Unspecified blepharitis unspecified eye, unspecified eyelid: Secondary | ICD-10-CM | POA: Diagnosis not present

## 2012-10-23 DIAGNOSIS — H1045 Other chronic allergic conjunctivitis: Secondary | ICD-10-CM | POA: Diagnosis not present

## 2012-11-02 ENCOUNTER — Other Ambulatory Visit: Payer: Self-pay | Admitting: Family Medicine

## 2012-11-10 ENCOUNTER — Ambulatory Visit (INDEPENDENT_AMBULATORY_CARE_PROVIDER_SITE_OTHER): Payer: Medicare Other | Admitting: Family Medicine

## 2012-11-10 ENCOUNTER — Encounter: Payer: Self-pay | Admitting: Family Medicine

## 2012-11-10 VITALS — BP 116/72 | HR 99 | Temp 98.1°F | Ht 69.0 in | Wt 180.0 lb

## 2012-11-10 DIAGNOSIS — F319 Bipolar disorder, unspecified: Secondary | ICD-10-CM

## 2012-11-10 DIAGNOSIS — E663 Overweight: Secondary | ICD-10-CM

## 2012-11-10 DIAGNOSIS — G4700 Insomnia, unspecified: Secondary | ICD-10-CM | POA: Diagnosis not present

## 2012-11-10 DIAGNOSIS — Z524 Kidney donor: Secondary | ICD-10-CM

## 2012-11-10 LAB — CBC
HCT: 40.3 % (ref 36.0–46.0)
Hemoglobin: 13.7 g/dL (ref 12.0–15.0)
MCH: 27.1 pg (ref 26.0–34.0)
MCHC: 34 g/dL (ref 30.0–36.0)
MCV: 79.6 fL (ref 78.0–100.0)
Platelets: 307 K/uL (ref 150–400)
RBC: 5.06 MIL/uL (ref 3.87–5.11)
RDW: 15.1 % (ref 11.5–15.5)
WBC: 11.5 K/uL — ABNORMAL HIGH (ref 4.0–10.5)

## 2012-11-10 LAB — BASIC METABOLIC PANEL
BUN: 16 mg/dL (ref 6–23)
Calcium: 10 mg/dL (ref 8.4–10.5)
Creat: 0.95 mg/dL (ref 0.50–1.10)

## 2012-11-10 NOTE — Patient Instructions (Signed)
It has been a pleasure to see you today!!! Please continue taking your medications as prescribed. I will call you with the labs results if they come back abnormal otherwise you will receive a letter. Make your next appointment next year for pap smear or sooner if you needed.

## 2012-11-13 DIAGNOSIS — G4700 Insomnia, unspecified: Secondary | ICD-10-CM | POA: Insufficient documentation

## 2012-11-13 NOTE — Progress Notes (Signed)
Family Medicine Office Visit Note   Subjective:   Patient ID: [name], female  DOB: [birth year], 47 y.o.. MRN: [id number]   Pt that comes today for her annual exam. She has no current complaints. She reports has been stable on Lamictal for her bipolar disorder and denies noticeable side effects.   Had mammography done in April/2014 with negative results. Pap smear done in 08/2010  Negative. No hx of abnormal pap smear, next due in 08/2013.  Review of Systems:  Pt denies SOB, chest pain, palpitations, headaches, dizziness, numbness or weakness. No changes on urinary or BM habits. No unintentional weigh loss/gain.  Objective:   Physical Exam: Gen:  NAD HEENT: Moist mucous membranes  CV: Regular rate and rhythm, no murmurs rubs or gallops PULM: Clear to auscultation bilaterally. No wheezes/rales/rhonchi ABD: Soft, non tender, non distended, normal bowel sounds EXT: No edema Neuro: Alert and oriented x3. No focalization  Assessment & Plan:

## 2012-11-13 NOTE — Assessment & Plan Note (Signed)
Controlled on Trazodone.

## 2012-11-13 NOTE — Assessment & Plan Note (Signed)
On  Lamictal and stable. Continue current regimen.

## 2012-11-13 NOTE — Assessment & Plan Note (Signed)
CBC, CMET for follow up.

## 2012-11-20 ENCOUNTER — Encounter: Payer: Self-pay | Admitting: Family Medicine

## 2013-01-01 ENCOUNTER — Other Ambulatory Visit: Payer: Self-pay | Admitting: Family Medicine

## 2013-01-08 DIAGNOSIS — H33109 Unspecified retinoschisis, unspecified eye: Secondary | ICD-10-CM | POA: Diagnosis not present

## 2013-01-08 DIAGNOSIS — H04129 Dry eye syndrome of unspecified lacrimal gland: Secondary | ICD-10-CM | POA: Diagnosis not present

## 2013-01-08 DIAGNOSIS — H01009 Unspecified blepharitis unspecified eye, unspecified eyelid: Secondary | ICD-10-CM | POA: Diagnosis not present

## 2013-01-08 DIAGNOSIS — Z0100 Encounter for examination of eyes and vision without abnormal findings: Secondary | ICD-10-CM | POA: Diagnosis not present

## 2013-01-18 DIAGNOSIS — L708 Other acne: Secondary | ICD-10-CM | POA: Diagnosis not present

## 2013-02-01 ENCOUNTER — Telehealth: Payer: Self-pay | Admitting: Family Medicine

## 2013-02-01 NOTE — Telephone Encounter (Signed)
Spoke to patient,I advised her to schedule an appointment to see Dr Thomes Dinning to discuss having blood test a titer drawn to see if she ever received a MMR .

## 2013-02-01 NOTE — Telephone Encounter (Signed)
Pt wants to know if she should be vaccinated for measles. According the news if you born before 1971, you should get vaccine Also is she for pneumonia vacccine? Please advisee

## 2013-03-01 ENCOUNTER — Other Ambulatory Visit: Payer: Self-pay | Admitting: Family Medicine

## 2013-03-13 DIAGNOSIS — IMO0002 Reserved for concepts with insufficient information to code with codable children: Secondary | ICD-10-CM | POA: Diagnosis not present

## 2013-03-13 DIAGNOSIS — M545 Low back pain, unspecified: Secondary | ICD-10-CM | POA: Diagnosis not present

## 2013-03-28 DIAGNOSIS — M545 Low back pain, unspecified: Secondary | ICD-10-CM | POA: Diagnosis not present

## 2013-04-11 ENCOUNTER — Other Ambulatory Visit: Payer: Self-pay

## 2013-04-11 DIAGNOSIS — Z1231 Encounter for screening mammogram for malignant neoplasm of breast: Secondary | ICD-10-CM

## 2013-04-24 DIAGNOSIS — M47817 Spondylosis without myelopathy or radiculopathy, lumbosacral region: Secondary | ICD-10-CM | POA: Diagnosis not present

## 2013-05-06 ENCOUNTER — Other Ambulatory Visit: Payer: Self-pay | Admitting: Family Medicine

## 2013-05-15 ENCOUNTER — Ambulatory Visit: Payer: Medicare Other

## 2013-05-16 ENCOUNTER — Encounter (INDEPENDENT_AMBULATORY_CARE_PROVIDER_SITE_OTHER): Payer: Self-pay

## 2013-05-16 ENCOUNTER — Ambulatory Visit
Admission: RE | Admit: 2013-05-16 | Discharge: 2013-05-16 | Disposition: A | Discharge status: Home / Self Care | Payer: Medicare Other | Source: Ambulatory Visit

## 2013-05-16 DIAGNOSIS — Z1231 Encounter for screening mammogram for malignant neoplasm of breast: Secondary | ICD-10-CM

## 2013-05-17 ENCOUNTER — Other Ambulatory Visit: Payer: Self-pay | Admitting: Family Medicine

## 2013-05-17 DIAGNOSIS — R928 Other abnormal and inconclusive findings on diagnostic imaging of breast: Secondary | ICD-10-CM

## 2013-05-21 ENCOUNTER — Ambulatory Visit
Admission: RE | Admit: 2013-05-21 | Discharge: 2013-05-21 | Disposition: A | Discharge status: Home / Self Care | Payer: Medicare Other | Source: Ambulatory Visit | Attending: Family Medicine | Admitting: Family Medicine

## 2013-05-21 DIAGNOSIS — N6009 Solitary cyst of unspecified breast: Secondary | ICD-10-CM | POA: Diagnosis not present

## 2013-05-21 DIAGNOSIS — R928 Other abnormal and inconclusive findings on diagnostic imaging of breast: Secondary | ICD-10-CM

## 2013-06-03 ENCOUNTER — Other Ambulatory Visit: Payer: Self-pay | Admitting: Family Medicine

## 2013-06-07 DIAGNOSIS — M4180 Other forms of scoliosis, site unspecified: Secondary | ICD-10-CM | POA: Diagnosis not present

## 2013-06-07 DIAGNOSIS — M47817 Spondylosis without myelopathy or radiculopathy, lumbosacral region: Secondary | ICD-10-CM | POA: Diagnosis not present

## 2013-07-27 ENCOUNTER — Encounter: Payer: Self-pay | Admitting: Family Medicine

## 2013-07-27 ENCOUNTER — Other Ambulatory Visit: Payer: Self-pay | Admitting: *Deleted

## 2013-07-27 ENCOUNTER — Telehealth: Payer: Self-pay | Admitting: *Deleted

## 2013-07-27 DIAGNOSIS — F319 Bipolar disorder, unspecified: Secondary | ICD-10-CM

## 2013-07-27 MED ORDER — LAMOTRIGINE 200 MG PO TABS: 200.0000 mg | ORAL_TABLET | Freq: Every day | ORAL | Status: DC

## 2013-07-27 NOTE — Telephone Encounter (Signed)
done

## 2013-07-27 NOTE — Telephone Encounter (Signed)
Just refilled a prescription for [name]'s Lamictal 200mg  for 11month.  Please call and have her schedule an office visit within the next month.

## 2013-07-27 NOTE — Telephone Encounter (Signed)
Relayed message,patient voiced understanding. [name] S  

## 2013-08-17 ENCOUNTER — Encounter: Payer: Self-pay | Admitting: Family Medicine

## 2013-08-17 ENCOUNTER — Ambulatory Visit (INDEPENDENT_AMBULATORY_CARE_PROVIDER_SITE_OTHER): Payer: Medicare Other | Admitting: Family Medicine

## 2013-08-17 VITALS — BP 145/80 | HR 105 | Ht 70.0 in | Wt 181.3 lb

## 2013-08-17 DIAGNOSIS — F319 Bipolar disorder, unspecified: Secondary | ICD-10-CM

## 2013-08-17 DIAGNOSIS — G4700 Insomnia, unspecified: Secondary | ICD-10-CM

## 2013-08-17 DIAGNOSIS — Z7189 Other specified counseling: Secondary | ICD-10-CM

## 2013-08-17 DIAGNOSIS — Z713 Dietary counseling and surveillance: Secondary | ICD-10-CM | POA: Insufficient documentation

## 2013-08-17 NOTE — Assessment & Plan Note (Signed)
Encouraged improving diet and exercising.  Recommended food diary so she can try to pinpoint what is causing the bloating.  Also recommended experimenting with different foods to see if bloating can be relieved by eliminating dairy or gluten products.  Encouraged use of natural remedy that has given her relief in the past.    Not yet due for blood work, but would like to have this done in October.  Is not yet due for pap smear but would like to have this done when she returns for annual visit in October. Consider further counseling on sleep hygiene, diet, and exercise and begin discussions on scheduling Mammogram and Colonoscopy next year.

## 2013-08-17 NOTE — Progress Notes (Signed)
Subjective:     Patient ID: [name], female   DOB: [birth year], 48 y.o.   MRN: [id number]  HPI [name] is a 48yo female with history of Bipolar 1 Disorder and Insomnia.  She is currently well controlled on Lamictal 200mg  and Trazadone 100mg .  She denies recent episodes of depression or mania.  She was diagnosed with Bipolar Disorder when she was very young and has tried Lithium and Depakote in the past.  She started both Lamictal and Trazadone in 2007 and has been stable since that time.  She is the mother of two children, one in high school attending early college at [employer] and another in college. She states she has tried not taking Trazadone a few times, but cannot sleep without it.  She has a calming room and does not watch tv or look at her phone in bed. She has been experiencing bloating lately, which she associates with poor diet and exercise.  She has noted relief of bloating with a natural drink she mixes up consisting of water, lemons, and additional ingredients.          Review of Systems  HENT: Negative for ear pain.   Gastrointestinal:       Bloating  Psychiatric/Behavioral:       Denies episodes of depression or mania       Objective:   Physical Exam  Constitutional: She appears well-developed and well-nourished. No distress.  HENT:  Head: Normocephalic and atraumatic.  Cardiovascular: Normal rate, regular rhythm and normal heart sounds.  Exam reveals no gallop and no friction rub.   No murmur heard. Pulmonary/Chest: Breath sounds normal. No respiratory distress. She has no wheezes. She has no rales.  Abdominal: Soft. Bowel sounds are normal. She exhibits no distension. There is no tenderness.  Musculoskeletal: She exhibits no edema.  Skin: She is not diaphoretic.  Psychiatric: She has a normal mood and affect. Her behavior is normal. Thought content normal.       Assessment:     Please refer to Problem List for Assessment.     Plan:     Please refer to  Problem List for Plan.

## 2013-08-17 NOTE — Patient Instructions (Signed)
Thank you so much for coming to visit me today!  I'm glad you are doing so well!  No changes in medication will be made today.  You are not due for any blood work to be done, but your pap smear should be done soon.  Please schedule an appointment at your own convenience for this.  I would recommend trying to exercise more and watching your diet to help control your bloating.  You could try keeping a food diary to see if there is anything specific that may be causing your bloating.  Thanks again! Dr. Gerlean Ren

## 2013-08-17 NOTE — Assessment & Plan Note (Signed)
Currently controlled on Lamictal 200mg  with no recent episodes of depression or mania.  Consider slowly titrating down dose in future, preferably after titrating down Trazadone.  Has tried Lithium and Depakote in past and began Lamictal in 2007.  Does not see a therapist.

## 2013-08-17 NOTE — Assessment & Plan Note (Signed)
Currently controlled on Trazadone 100mg .  States she cannot get to sleep without it.  Consider titrating down to a smaller dose at next visit.

## 2013-08-24 DIAGNOSIS — L708 Other acne: Secondary | ICD-10-CM | POA: Diagnosis not present

## 2013-08-27 ENCOUNTER — Other Ambulatory Visit: Payer: Self-pay | Admitting: *Deleted

## 2013-08-28 ENCOUNTER — Other Ambulatory Visit: Payer: Self-pay | Admitting: *Deleted

## 2013-08-28 MED ORDER — TRAZODONE HCL 100 MG PO TABS: ORAL_TABLET | ORAL | Status: DC

## 2013-08-29 ENCOUNTER — Telehealth: Payer: Self-pay | Admitting: *Deleted

## 2013-08-29 ENCOUNTER — Other Ambulatory Visit: Payer: Self-pay | Admitting: *Deleted

## 2013-08-29 MED ORDER — TRAZODONE HCL 100 MG PO TABS: ORAL_TABLET | ORAL | Status: DC

## 2013-08-29 NOTE — Telephone Encounter (Signed)
Already printed out a prescription for Trazadone this afternoon and placed it at the front desk for her to pick up.  Please call and inform her.

## 2013-08-29 NOTE — Telephone Encounter (Signed)
Patient was informed,she request that we call it in.  RX was called in and patient was notified. Collins Dimaria, Lewie Loron

## 2013-08-29 NOTE — Telephone Encounter (Signed)
Message copied by Corinna Capra on Wed Aug 29, 2013 11:50 AM ------      Message from: Lorna Few      Created: Wed Aug 29, 2013  8:13 AM       Please let Mrs. Dunwoody know that her prescription is waiting at the front desk!  Thanks for your help! ------

## 2013-08-29 NOTE — Telephone Encounter (Signed)
Message copied by Corinna Capra on Wed Aug 29, 2013 11:46 AM ------      Message from: Lorna Few      Created: Wed Aug 29, 2013  8:13 AM       Please let Mrs. Zeiser know that her prescription is waiting at the front desk!  Thanks for your help! ------

## 2013-08-30 NOTE — Telephone Encounter (Signed)
Left voice message informing pt Rx is ready for pick up.  Derl Barrow, RN

## 2013-09-28 ENCOUNTER — Ambulatory Visit (INDEPENDENT_AMBULATORY_CARE_PROVIDER_SITE_OTHER): Payer: Medicare Other | Admitting: *Deleted

## 2013-09-28 DIAGNOSIS — Z23 Encounter for immunization: Secondary | ICD-10-CM | POA: Diagnosis not present

## 2013-10-01 ENCOUNTER — Other Ambulatory Visit: Payer: Self-pay | Admitting: Family Medicine

## 2013-10-05 ENCOUNTER — Other Ambulatory Visit: Payer: Self-pay | Admitting: *Deleted

## 2013-10-05 NOTE — Telephone Encounter (Signed)
LVM for patient to call back. ?

## 2013-10-05 NOTE — Telephone Encounter (Signed)
Please let [name] know that her prescription of Lamictal was filled on 10/01/13 and is already waiting for her at the pharmacy. Thanks!

## 2013-10-08 ENCOUNTER — Other Ambulatory Visit: Payer: Self-pay | Admitting: *Deleted

## 2013-10-08 NOTE — Telephone Encounter (Signed)
Please let Mrs. Frick know that there is already a prescription for Lamictal waiting for her at the pharmacy.

## 2013-10-20 ENCOUNTER — Emergency Department (INDEPENDENT_AMBULATORY_CARE_PROVIDER_SITE_OTHER)
Admission: EM | Admit: 2013-10-20 | Discharge: 2013-10-20 | Disposition: A | Discharge status: Home / Self Care | Payer: Medicare Other | Source: Home / Self Care

## 2013-10-20 ENCOUNTER — Encounter (HOSPITAL_COMMUNITY): Payer: Self-pay | Admitting: Emergency Medicine

## 2013-10-20 ENCOUNTER — Ambulatory Visit (HOSPITAL_COMMUNITY): Payer: Medicare Other | Attending: Diagnostic Radiology

## 2013-10-20 DIAGNOSIS — R7981 Abnormal blood-gas level: Secondary | ICD-10-CM | POA: Diagnosis not present

## 2013-10-20 DIAGNOSIS — R509 Fever, unspecified: Secondary | ICD-10-CM

## 2013-10-20 DIAGNOSIS — J209 Acute bronchitis, unspecified: Secondary | ICD-10-CM

## 2013-10-20 DIAGNOSIS — R05 Cough: Secondary | ICD-10-CM | POA: Diagnosis not present

## 2013-10-20 DIAGNOSIS — M419 Scoliosis, unspecified: Secondary | ICD-10-CM | POA: Diagnosis not present

## 2013-10-20 DIAGNOSIS — J984 Other disorders of lung: Secondary | ICD-10-CM | POA: Insufficient documentation

## 2013-10-20 MED ORDER — IPRATROPIUM-ALBUTEROL 0.5-2.5 (3) MG/3ML IN SOLN
3.0000 mL | Freq: Once | RESPIRATORY_TRACT | Status: AC
  Administered 2013-10-20: 3 mL via RESPIRATORY_TRACT

## 2013-10-20 MED ORDER — ALBUTEROL SULFATE (2.5 MG/3ML) 0.083% IN NEBU
2.5000 mg | INHALATION_SOLUTION | Freq: Once | RESPIRATORY_TRACT | Status: AC
  Administered 2013-10-20: 2.5 mg via RESPIRATORY_TRACT

## 2013-10-20 MED ORDER — ACETAMINOPHEN 325 MG PO TABS
ORAL_TABLET | ORAL | Status: AC
  Filled 2013-10-20: qty 3

## 2013-10-20 MED ORDER — METHYLPREDNISOLONE SODIUM SUCC 125 MG IJ SOLR
INTRAMUSCULAR | Status: AC
  Filled 2013-10-20: qty 2

## 2013-10-20 MED ORDER — IPRATROPIUM-ALBUTEROL 0.5-2.5 (3) MG/3ML IN SOLN
RESPIRATORY_TRACT | Status: AC
  Filled 2013-10-20: qty 3

## 2013-10-20 MED ORDER — ACETAMINOPHEN 325 MG PO TABS
650.0000 mg | ORAL_TABLET | Freq: Once | ORAL | Status: AC
  Administered 2013-10-20: 650 mg via ORAL

## 2013-10-20 MED ORDER — PREDNISONE 20 MG PO TABS: ORAL_TABLET | ORAL | Status: DC

## 2013-10-20 MED ORDER — DOXYCYCLINE HYCLATE 100 MG PO CAPS: 100.0000 mg | ORAL_CAPSULE | Freq: Two times a day (BID) | ORAL | Status: DC

## 2013-10-20 MED ORDER — AZITHROMYCIN 250 MG PO TABS: 250.0000 mg | ORAL_TABLET | Freq: Every day | ORAL | Status: DC

## 2013-10-20 MED ORDER — METHYLPREDNISOLONE SODIUM SUCC 125 MG IJ SOLR
80.0000 mg | Freq: Once | INTRAMUSCULAR | Status: AC
  Administered 2013-10-20: 80 mg via INTRAMUSCULAR

## 2013-10-20 MED ORDER — ALBUTEROL SULFATE (2.5 MG/3ML) 0.083% IN NEBU
INHALATION_SOLUTION | RESPIRATORY_TRACT | Status: AC
  Filled 2013-10-20: qty 3

## 2013-10-20 MED ORDER — ALBUTEROL SULFATE HFA 108 (90 BASE) MCG/ACT IN AERS: 2.0000 | INHALATION_SPRAY | RESPIRATORY_TRACT | Status: DC | PRN

## 2013-10-20 NOTE — Discharge Instructions (Signed)
Acute Bronchitis Bronchitis is when the airways that extend from the windpipe into the lungs get red, puffy, and painful (inflamed). Bronchitis often causes thick spit (mucus) to develop. This leads to a cough. A cough is the most common symptom of bronchitis. In acute bronchitis, the condition usually begins suddenly and goes away over time (usually in 2 weeks). Smoking, allergies, and asthma can make bronchitis worse. Repeated episodes of bronchitis may cause more lung problems. HOME CARE  Rest.  Drink enough fluids to keep your pee (urine) clear or pale yellow (unless you need to limit fluids as told by your doctor).  Only take over-the-counter or prescription medicines as told by your doctor.  Avoid smoking and secondhand smoke. These can make bronchitis worse. If you are a smoker, think about using nicotine gum or skin patches. Quitting smoking will help your lungs heal faster.  Reduce the chance of getting bronchitis again by:  Washing your hands often.  Avoiding people with cold symptoms.  Trying not to touch your hands to your mouth, nose, or eyes.  Follow up with your doctor as told. GET HELP IF: Your symptoms do not improve after 1 week of treatment. Symptoms include:  Cough.  Fever.  Coughing up thick spit.  Body aches.  Chest congestion.  Chills.  Shortness of breath.  Sore throat. GET HELP RIGHT AWAY IF:   You have an increased fever.  You have chills.  You have severe shortness of breath.  You have bloody thick spit (sputum).  You throw up (vomit) often.  You lose too much body fluid (dehydration).  You have a severe headache.  You faint. MAKE SURE YOU:   Understand these instructions.  Will watch your condition.  Will get help right away if you are not doing well or get worse. Document Released: 06/16/2007 Document Revised: 08/30/2012 Document Reviewed: 06/20/2012 [facility] Patient Information 2015 [place], [place]. This information is not  intended to replace advice given to you by your health care provider. Make sure you discuss any questions you have with your health care provider.  Fever, Adult A fever is a temperature of 100.4 F (38 C) or above.  HOME CARE  Take fever medicine as told by your doctor. Do not  take aspirin for fever if you are younger than 49 years of age.  If you are given antibiotic medicine, take it as told. Finish the medicine even if you start to feel better.  Rest.  Drink enough fluids to keep your pee (urine) clear or pale yellow. Do not drink alcohol.  Take a bath or shower with room temperature water. Do not use ice water or alcohol sponge baths.  Wear lightweight, loose clothes. GET HELP RIGHT AWAY IF:   You are short of breath or have trouble breathing.  You are very weak.  You are dizzy or you pass out (faint).  You are very thirsty or are making little or no urine.  You have new pain.  You throw up (vomit) or have watery poop (diarrhea).  You keep throwing up or having watery poop for more than 1 to 2 days.  You have a stiff neck or light bothers your eyes.  You have a skin rash.  You have a fever or problems (symptoms) that last for more than 2 to 3 days.  You have a fever and your problems quickly get worse.  You keep throwing up the fluids you drink.  You do not feel better after 3 days.  You  have new problems. MAKE SURE YOU:   Understand these instructions.  Will watch your condition.  Will get help right away if you are not doing well or get worse. Document Released: 10/07/2007 Document Revised: 03/22/2011 Document Reviewed: 10/29/2010 [facility] Patient Information 2015 [place], [place]. This information is not intended to replace advice given to you by your health care provider. Make sure you discuss any questions you have with your health care provider.

## 2013-10-20 NOTE — ED Notes (Signed)
Sick since yesterday, cough, fever, history of pneumonia x 2

## 2013-10-20 NOTE — ED Provider Notes (Signed)
Medical screening examination/treatment/procedure(s) were performed by non-physician practitioner and as supervising physician I was immediately available for consultation/collaboration.  Philipp Deputy, M.D.  Harden Mo, MD 10/20/13 [phone]

## 2013-10-20 NOTE — ED Provider Notes (Signed)
CSN: [id number]     Arrival date & time 10/20/13  0905 History   First MD Initiated Contact with Patient 10/20/13 [phone]     Chief Complaint  Patient presents with  . URI   (Consider location/radiation/quality/duration/timing/severity/associated sxs/prior Treatment) HPI Comments: 48 year old female presents to the urgent care with a 24-hour history of fever, cough, wheezing, chest pain, rhinorrhea, ear aches and sore throat. She has a history of smoking 14 years and recently changed to a nicotine vapor replacement. Additional history includes bipolar 1 disorder and is currently managed with medical 200 mg tablet and trazodone 100 mg tablet each bedtime and pneumonia.   History reviewed. No pertinent past medical history. Past Surgical History  Procedure Laterality Date  . Endometriosis s/p left fallopian tube removal.    . Appendectomy     Family History  Problem Relation Age of Onset  . Breast cancer Mother   . Breast cancer Maternal Grandmother    History  Substance Use Topics  . Smoking status: Former Smoker -- 0.80 packs/day for 10 years    Types: Cigarettes    Quit date: 10/12/2011  . Smokeless tobacco: Not on file     Comment: non somking in 5 days  . Alcohol Use: No   OB History   Grav Para Term Preterm Abortions TAB SAB Ect Mult Living                 Review of Systems  Constitutional: Positive for fever, activity change and fatigue. Negative for chills and appetite change.  HENT: Positive for congestion, postnasal drip, rhinorrhea and sore throat. Negative for facial swelling.   Eyes: Negative.   Respiratory: Positive for cough and wheezing.   Cardiovascular: Negative.  Negative for chest pain and leg swelling.  Gastrointestinal: Negative.   Genitourinary: Negative.   Musculoskeletal: Negative for neck pain and neck stiffness.  Skin: Negative for pallor and rash.  Neurological: Negative.     Allergies  Iohexol; Morphine and related; Septra; and Sulfa  antibiotics  Home Medications   Prior to Admission medications   Medication Sig Start Date End Date Taking? Authorizing Provider  lamoTRIgine (LAMICTAL) 200 MG tablet TAKE 1 TABLET (200 MG TOTAL) BY MOUTH DAILY. 10/01/13  Yes Hercules N Rumley, DO  traZODone (DESYREL) 100 MG tablet TAKE 1 TABLET BY MOUTH AT BEDTIME 08/29/13  Yes [place] N Rumley, DO  albuterol (PROVENTIL HFA;VENTOLIN HFA) 108 (90 BASE) MCG/ACT inhaler Inhale 2 puffs into the lungs every 6 (six) hours as needed for wheezing. 10/07/11   Katherina Mires, MD  albuterol (PROVENTIL HFA;VENTOLIN HFA) 108 (90 BASE) MCG/ACT inhaler Inhale 2 puffs into the lungs every 4 (four) hours as needed for wheezing or shortness of breath. 10/20/13   Janne Napoleon, NP  azithromycin (ZITHROMAX) 250 MG tablet Take 1 tablet (250 mg total) by mouth daily. Take first 2 tablets together, then 1 every day until finished. 10/20/13   Janne Napoleon, NP  cetirizine (ZYRTEC) 10 MG tablet Take 1 tablet (10 mg total) by mouth daily. 01/28/11 01/28/12  Cletus Gash, MD  doxycycline (VIBRAMYCIN) 100 MG capsule Take 1 capsule (100 mg total) by mouth 2 (two) times daily. 10/20/13   Janne Napoleon, NP  predniSONE (DELTASONE) 20 MG tablet Take 3 tabs po on first day, 2 tabs second day, 2 tabs third day, 1 tab fourth day, 1 tab 5th day. Take with food. 10/20/13   Janne Napoleon, NP   BP 148/80  Pulse 133  Temp(Src) 101.5 F (38.6 C) (  Oral)  SpO2 92% Physical Exam  Nursing note and vitals reviewed. Constitutional: She is oriented to person, place, and time. She appears well-developed and well-nourished. No distress.  Appears mild to moderately ill. No acute distress.  HENT:  Right Ear: External ear normal.  Left Ear: External ear normal.  Mouth/Throat: No oropharyngeal exudate.  OP with clear PND  Eyes: Conjunctivae and EOM are normal.  Neck: Normal range of motion. Neck supple.  Cardiovascular: Normal rate, regular rhythm and normal heart sounds.   Pulmonary/Chest: She has  no rales.  Increased rate and effort. Bilateral, diffuse wheezing. Resp Rate 24   Musculoskeletal: Normal range of motion. She exhibits no edema.  Lymphadenopathy:    She has no cervical adenopathy.  Neurological: She is alert and oriented to person, place, and time.  Skin: Skin is warm and dry. No rash noted.  Psychiatric: She has a normal mood and affect.    ED Course  Procedures (including critical care time) Labs Review Labs Reviewed - No data to display  Imaging Review Dg Chest 2 View  10/20/2013   CLINICAL DATA:  Fever and productive cough  EXAM: CHEST  2 VIEW  COMPARISON:  Chest radiograph August 13, 2008  FINDINGS: There is mild scarring in the left base. Elsewhere lungs are clear. Heart size and pulmonary vascularity are normal. No adenopathy. There is lower thoracic and lumbar levoscoliosis with rod fixation.  IMPRESSION: Mild scarring left base. No edema or consolidation. Scoliosis with rod fixation.   Electronically Signed   By: Lowella Grip M.D.   On: 10/20/2013 11:13     MDM   1. Bronchitis, acute, with bronchospasm   2. Other specified fever   3. Low oxygen saturation    Post Duoneb improved air movement, only rare wheeze. Pt st breathing better. The chest discomfort pre albuterol has now abated. VS at discharge POx 91%. I could find no documented saturations in her chart for past several yrs. Unsure of baseline. T 102 deg.  Tx with APAP. Home tylenol q 4h prn Albuterol HFA q 4h prn Doxy 100 mg BID and Z pack Prednisone taper Solumedro 80 mg IM now Pt st feels better, sitting on side of bed and speaking complete sentences and show no distress. She is admonished that at any time she feels worse, hard to breath, increase fever, cough or other problems she must go to the ED.     Janne Napoleon, NP 10/20/13 1211  Janne Napoleon, NP 10/20/13 1215

## 2013-10-20 NOTE — ED Notes (Signed)
To xray department

## 2013-10-30 ENCOUNTER — Encounter: Payer: Self-pay | Admitting: Family Medicine

## 2013-10-30 ENCOUNTER — Ambulatory Visit (INDEPENDENT_AMBULATORY_CARE_PROVIDER_SITE_OTHER): Payer: Medicare Other | Admitting: Family Medicine

## 2013-10-30 VITALS — BP 138/78 | HR 100 | Temp 98.3°F | Ht 70.0 in | Wt 182.0 lb

## 2013-10-30 DIAGNOSIS — J209 Acute bronchitis, unspecified: Secondary | ICD-10-CM | POA: Diagnosis not present

## 2013-10-30 DIAGNOSIS — Z720 Tobacco use: Secondary | ICD-10-CM | POA: Diagnosis not present

## 2013-10-30 DIAGNOSIS — Z87891 Personal history of nicotine dependence: Secondary | ICD-10-CM

## 2013-10-30 MED ORDER — ALBUTEROL SULFATE HFA 108 (90 BASE) MCG/ACT IN AERS: 2.0000 | INHALATION_SPRAY | Freq: Four times a day (QID) | RESPIRATORY_TRACT | Status: DC | PRN

## 2013-10-30 MED ORDER — NICOTINE 14 MG/24HR TD PT24: 14.0000 mg | MEDICATED_PATCH | Freq: Every day | TRANSDERMAL | Status: DC

## 2013-10-30 NOTE — Assessment & Plan Note (Signed)
Patient still using vapor. Interested in nicotine patch to quit completely.  -nicotine patch provided -follow up with PCP in one month

## 2013-10-30 NOTE — Progress Notes (Signed)
   Subjective:    Patient ID: AUTYM SIESS, female    DOB: [birth year], 48 y.o.   MRN: [id number]  HPI 48 y/o female presents for ED follow up.   Seen in ED on 10/20/13, diagnosed with acute bronchitis, given prescription for prednisone/Doxycycline/and Z-pack, has cough and nasal congestion that proceeded shortness of breath, has associated bilateral rib pain from coughing, reports 60% improvement in symptoms, still using albuterol once daily (much less than last week)  Social - previous 1/2 ppd smoker for 15 years, still uses vapor, interested in nicotine replacement   Review of Systems  Respiratory: Positive for cough and wheezing. Negative for chest tightness.   Cardiovascular: Negative for chest pain.  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

## 2013-10-30 NOTE — Assessment & Plan Note (Signed)
Patient presents for ED follow up for acute bronchitis. Symptoms are improved. She has history of PE however symptoms not consistent with PE. Wells score 1.5 (due to previous DVT) -continue PRN albuterol -given smoking history will schedule for Spirometry

## 2013-10-30 NOTE — Patient Instructions (Signed)
Cough/bronchitis - please schedule an appointment with Dr. Valentina Lucks to have Spirometry completed (lung function testing), continue to use albuterol as needed  Smoking - start nicotine patch, attempt to cut down on vapor

## 2013-11-03 ENCOUNTER — Other Ambulatory Visit: Payer: Self-pay | Admitting: Family Medicine

## 2013-11-06 ENCOUNTER — Ambulatory Visit: Payer: Medicare Other | Admitting: Pharmacist

## 2013-11-19 ENCOUNTER — Other Ambulatory Visit (HOSPITAL_COMMUNITY)
Admission: RE | Admit: 2013-11-19 | Discharge: 2013-11-19 | Disposition: A | Discharge status: Home / Self Care | Payer: Medicare Other | Source: Ambulatory Visit | Attending: Family Medicine | Admitting: Family Medicine

## 2013-11-19 ENCOUNTER — Ambulatory Visit (INDEPENDENT_AMBULATORY_CARE_PROVIDER_SITE_OTHER): Payer: Medicare Other | Admitting: Family Medicine

## 2013-11-19 ENCOUNTER — Encounter: Payer: Self-pay | Admitting: Family Medicine

## 2013-11-19 VITALS — BP 132/78 | HR 105 | Temp 98.3°F | Ht 70.0 in | Wt 183.0 lb

## 2013-11-19 DIAGNOSIS — Z124 Encounter for screening for malignant neoplasm of cervix: Secondary | ICD-10-CM

## 2013-11-19 DIAGNOSIS — Z524 Kidney donor: Secondary | ICD-10-CM

## 2013-11-19 DIAGNOSIS — R030 Elevated blood-pressure reading, without diagnosis of hypertension: Secondary | ICD-10-CM | POA: Diagnosis not present

## 2013-11-19 DIAGNOSIS — Z720 Tobacco use: Secondary | ICD-10-CM | POA: Diagnosis not present

## 2013-11-19 DIAGNOSIS — R87810 Cervical high risk human papillomavirus (HPV) DNA test positive: Secondary | ICD-10-CM | POA: Insufficient documentation

## 2013-11-19 DIAGNOSIS — Z113 Encounter for screening for infections with a predominantly sexual mode of transmission: Secondary | ICD-10-CM | POA: Diagnosis not present

## 2013-11-19 DIAGNOSIS — Z87891 Personal history of nicotine dependence: Secondary | ICD-10-CM

## 2013-11-19 DIAGNOSIS — E663 Overweight: Secondary | ICD-10-CM | POA: Diagnosis not present

## 2013-11-19 DIAGNOSIS — IMO0001 Reserved for inherently not codable concepts without codable children: Secondary | ICD-10-CM

## 2013-11-19 NOTE — Patient Instructions (Signed)
Thank you so much for coming to visit [place] today! Your blood pressure is a little high today at 132/78. Please continue to work on your diet and exercise. You're doing a great job with not smoking...keep up the good work!  Please return at your convenience in the next week or two so we can check your kidneys, liver, and cholesterol!  Return for your next visit in one year to sooner if needed!  Thanks again! Dr. Gerlean Ren

## 2013-11-20 DIAGNOSIS — R030 Elevated blood-pressure reading, without diagnosis of hypertension: Secondary | ICD-10-CM

## 2013-11-20 DIAGNOSIS — IMO0001 Reserved for inherently not codable concepts without codable children: Secondary | ICD-10-CM | POA: Insufficient documentation

## 2013-11-20 NOTE — Progress Notes (Signed)
Subjective:     Patient ID: [name], female   DOB: 1965-12-20, 48 y.o.   MRN: [id number]  HPI Mrs. Vastine is a 48yo female presenting for annual wellness exam. - No acute complaints today - No problems with medications. She is tolerating them well and they are working adequately. - Would like to have pap smear done today and lab work  - Discussed possible spacing out BMPs. She is a kidney donor and has family history of kidney problems, so would like to continue to monitor annually - Up to date on mammogram (05/16/13) followed up by [place] (5/11), which showed benign cyst at 8:00 - Has been trying to quit smoking. Tried nicotine patches, but preferred vapors. Has not smoked any cigarettes since starting vapors - No further complaints today.  Review of Systems  Psychiatric/Behavioral: The patient is not nervous/anxious.   All other systems reviewed and are negative.      Objective:   Physical Exam  Constitutional: She is oriented to person, place, and time. She appears well-developed and well-nourished. No distress.  HENT:  Head: Normocephalic and atraumatic.  Cardiovascular: Normal rate, regular rhythm, normal heart sounds and intact distal pulses.  Exam reveals no gallop and no friction rub.   No murmur heard. Pulmonary/Chest: Effort normal and breath sounds normal. No respiratory distress. She has no wheezes. She has no rales.  Abdominal: Soft. Bowel sounds are normal. She exhibits no distension. There is no tenderness.  Genitourinary: Vagina normal. There is no rash or lesion on the right labia. There is no rash or lesion on the left labia. Cervix exhibits no motion tenderness, no discharge and no friability. Right adnexum displays no mass and no tenderness. Left adnexum displays no mass and no tenderness. No tenderness in the vagina. No vaginal discharge found.  Musculoskeletal: She exhibits no edema.  Neurological: She is alert and oriented to person, place, and time.  Skin:  Skin is warm. No rash noted.  Psychiatric: She has a normal mood and affect. Her behavior is normal.       Assessment:     Please refer to Problem List for Assessment.     Plan:     Please refer to Problem List for Plan.

## 2013-11-20 NOTE — Assessment & Plan Note (Signed)
-   Counseled on continuing smoking cessation

## 2013-11-20 NOTE — Assessment & Plan Note (Signed)
-   Initially 159/84. Trended down to 132/78 prior to leaving - Manage with diet and exercise - No medications indicated at this time - Will return for lipid panel and BMP. Will calculate ASCVD risk based on results.

## 2013-11-21 LAB — CYTOLOGY - PAP

## 2013-11-27 ENCOUNTER — Telehealth: Payer: Self-pay | Admitting: Family Medicine

## 2013-11-27 NOTE — Telephone Encounter (Signed)
Called Mrs. Solimine to give her the pap smear results from her last visit.  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 Discussed HPV and its causes as well as manifestations. Recommended scheduling an appointment at the [facility] for further evaluation.  Instructed to call if she has any further questions.

## 2013-11-29 ENCOUNTER — Ambulatory Visit (INDEPENDENT_AMBULATORY_CARE_PROVIDER_SITE_OTHER): Payer: Medicare Other | Admitting: Family Medicine

## 2013-11-29 VITALS — BP 124/72 | HR 104 | Temp 97.9°F | Ht 70.0 in | Wt 184.5 lb

## 2013-11-29 DIAGNOSIS — R030 Elevated blood-pressure reading, without diagnosis of hypertension: Secondary | ICD-10-CM | POA: Diagnosis not present

## 2013-11-29 DIAGNOSIS — R87618 Other abnormal cytological findings on specimens from cervix uteri: Secondary | ICD-10-CM

## 2013-11-29 DIAGNOSIS — E663 Overweight: Secondary | ICD-10-CM

## 2013-11-29 DIAGNOSIS — IMO0001 Reserved for inherently not codable concepts without codable children: Secondary | ICD-10-CM

## 2013-11-29 LAB — LIPID PANEL
Cholesterol: 187 mg/dL (ref 0–200)
HDL: 42 mg/dL (ref >39)
LDL CALC: 114 mg/dL — AB (ref 0–99)
TRIGLYCERIDES: 157 mg/dL — AB (ref <150)
Total CHOL/HDL Ratio: 4.5 Ratio
VLDL: 31 mg/dL (ref 0–40)

## 2013-11-29 LAB — COMPLETE METABOLIC PANEL WITH GFR
ALT: 9 U/L (ref 0–35)
AST: 14 U/L (ref 0–37)
Albumin: 4 g/dL (ref 3.5–5.2)
Alkaline Phosphatase: 67 U/L (ref 39–117)
BUN: 13 mg/dL (ref 6–23)
CHLORIDE: 104 meq/L (ref 96–112)
CO2: 24 mEq/L (ref 19–32)
Calcium: 9.6 mg/dL (ref 8.4–10.5)
Creat: 0.94 mg/dL (ref 0.50–1.10)
GFR, EST AFRICAN AMERICAN: 82 mL/min
GFR, EST NON AFRICAN AMERICAN: 71 mL/min
GLUCOSE: 85 mg/dL (ref 70–99)
Potassium: 4.2 mEq/L (ref 3.5–5.3)
Sodium: 139 mEq/L (ref 135–145)
TOTAL PROTEIN: 6.9 g/dL (ref 6.0–8.3)
Total Bilirubin: 0.3 mg/dL (ref 0.2–1.2)

## 2013-11-29 NOTE — Progress Notes (Signed)
Patient ID: [name], female   DOB: 02-23-1965, 48 y.o.   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  Placed in lithotomy position. Cervix viewed with speculum and colposcope after application of acetic acid.   Colposcopy adequate (entire squamocolumnar junctions seen  in entirety) ?  Yes--postmenopausal cervix Acetowhite lesions?no Punctation?no Mosaicism?  no Abnormal vasculature?  no Biopsies?no ECC?no Complications? no  COMMENTS: Patient was given post procedure instructions.  I would recommend pap (cotest) in one year

## 2013-12-03 ENCOUNTER — Other Ambulatory Visit: Payer: Self-pay | Admitting: Family Medicine

## 2013-12-05 NOTE — Telephone Encounter (Signed)
Pt called and needs a refill on her Lamotrigine. She is out and the pharmacy has been faxing [place] for a couple of days now. jw

## 2013-12-14 ENCOUNTER — Telehealth: Payer: Self-pay | Admitting: Family Medicine

## 2013-12-14 NOTE — Telephone Encounter (Signed)
Pt called and would like someone to call her about her lab test in November. jw

## 2013-12-31 NOTE — Telephone Encounter (Signed)
Pt is calling back and would like to know what her lab results were. jw

## 2014-01-01 ENCOUNTER — Other Ambulatory Visit: Payer: Self-pay | Admitting: Family Medicine

## 2014-01-01 NOTE — Telephone Encounter (Signed)
Message left on my voicemail--patient states she had a CPE x 1 mo ago.  Has called our office twice regarding her lab results and has not heard back from our office.  Called and left message for patient to call me back.  Burna Forts, BSN, RN-BC

## 2014-01-15 ENCOUNTER — Encounter: Payer: Self-pay | Admitting: Family Medicine

## 2014-01-15 ENCOUNTER — Ambulatory Visit (INDEPENDENT_AMBULATORY_CARE_PROVIDER_SITE_OTHER): Payer: Medicare Other | Admitting: Family Medicine

## 2014-01-15 VITALS — BP 130/76 | HR 101 | Temp 98.9°F | Ht 70.0 in

## 2014-01-15 DIAGNOSIS — L659 Nonscarring hair loss, unspecified: Secondary | ICD-10-CM | POA: Insufficient documentation

## 2014-01-15 DIAGNOSIS — Z720 Tobacco use: Secondary | ICD-10-CM | POA: Diagnosis not present

## 2014-01-15 DIAGNOSIS — Z87891 Personal history of nicotine dependence: Secondary | ICD-10-CM

## 2014-01-15 LAB — T3, FREE: T3 FREE: 3.4 pg/mL (ref 2.3–4.2)

## 2014-01-15 LAB — T4, FREE: Free T4: 1.32 ng/dL (ref 0.80–1.80)

## 2014-01-15 LAB — TSH: TSH: 1.565 u[IU]/mL (ref 0.350–4.500)

## 2014-01-15 NOTE — Patient Instructions (Signed)
We'll check your labs and I'll contact you with the results.  On the way out make an appt to see Dr. Valentina Lucks for lung tests and smoking cessation.

## 2014-01-15 NOTE — Progress Notes (Signed)
Subjective:    [name] is a 49 y.o. female who presents to [facility] today:  1.  Hair thinning:  Patient has noticed her hair thinning for past several months.  Same thing happened to mother around time of menopause.  Mother had thyroid checked, came back abnormal, and she is now taking thyroid supplements with improved hair retention.  Patient would like her thyroid checked today.  Denies heat/cold intolerance.  Endorses weight gain.  Intermittent fatigue.  No dypsnea, LE edema, or palpitations.   She is trying multivitamins and fortified/enriched shampoos without much relief.   ROS as above per HPI, otherwise neg.    The following portions of the patient's history were reviewed and updated as appropriate: allergies, current medications, past medical history, family and social history, and problem list. Patient is a nonsmoker.    PMH reviewed.  No past medical history on file. Past Surgical History  Procedure Laterality Date  . Endometriosis s/p left fallopian tube removal.    . Appendectomy      Medications reviewed. Current Outpatient Prescriptions  Medication Sig Dispense Refill  . albuterol (PROVENTIL HFA;VENTOLIN HFA) 108 (90 BASE) MCG/ACT inhaler Inhale 2 puffs into the lungs every 6 (six) hours as needed for wheezing. 1 Inhaler 2  . lamoTRIgine (LAMICTAL) 200 MG tablet TAKE 1 TABLET (200 MG TOTAL) BY MOUTH DAILY. 30 tablet 1  . nicotine (NICODERM CQ - DOSED IN MG/24 HOURS) 14 mg/24hr patch Place 1 patch (14 mg total) onto the skin daily. 28 patch 0  . traZODone (DESYREL) 100 MG tablet TAKE 1 TABLET BY MOUTH AT BEDTIME 60 tablet 0   No current facility-administered medications for this visit.     Objective:   Physical Exam BP 130/76 mmHg  Pulse 101  Temp(Src) 98.9 F (37.2 C) (Oral)  Ht 5\' 10"  (1.778 m) Gen:  Alert, cooperative patient who appears stated age in no acute distress.  Vital signs reviewed. HEENT: EOMI,  MMM Heart:  RRR Lungs: Clear Ext:  No edema  noted Bl  No results found for this or any previous visit (from the past 72 hour(s)).  '

## 2014-01-15 NOTE — Assessment & Plan Note (Signed)
Recently restarted.   Interested in quitting completely. Recommended to FU with [place] [facility], schedule appt on way out today.

## 2014-01-15 NOTE — Assessment & Plan Note (Signed)
Likely secondary to hormonal changes at menopause. Checking thyroid function tests today. Will call with results.

## 2014-01-18 ENCOUNTER — Telehealth: Payer: Self-pay | Admitting: Family Medicine

## 2014-01-18 NOTE — Telephone Encounter (Signed)
Called and spoke with patient.  Relayed normal thyroid function testing. Discussed if she is concerned about cosmesis, can refer to dermatology.  She will hold off for now.

## 2014-01-31 ENCOUNTER — Ambulatory Visit: Payer: Medicare Other | Admitting: Pharmacist

## 2014-02-02 ENCOUNTER — Other Ambulatory Visit: Payer: Self-pay | Admitting: Family Medicine

## 2014-02-05 ENCOUNTER — Ambulatory Visit (INDEPENDENT_AMBULATORY_CARE_PROVIDER_SITE_OTHER): Payer: Medicare Other | Admitting: Pharmacist

## 2014-02-05 ENCOUNTER — Encounter: Payer: Self-pay | Admitting: Pharmacist

## 2014-02-05 VITALS — BP 153/87 | HR 109 | Ht 69.0 in | Wt 188.7 lb

## 2014-02-05 DIAGNOSIS — Z720 Tobacco use: Secondary | ICD-10-CM

## 2014-02-05 DIAGNOSIS — J209 Acute bronchitis, unspecified: Secondary | ICD-10-CM | POA: Diagnosis not present

## 2014-02-05 MED ORDER — NORTRIPTYLINE HCL 25 MG PO CAPS: 25.0000 mg | ORAL_CAPSULE | Freq: Every day | ORAL | Status: DC

## 2014-02-05 NOTE — Patient Instructions (Addendum)
It was nice to meet you today [name].  Your lung age was 57, the best thing you can do for your health at this time is to quit smoking.   Start taking nortriptyline 25 mg every night. Stop the trazodone.  Pick up nicotine gum or lozenges to help with immediate cravings.  Come back and see [place] in two weeks.

## 2014-02-05 NOTE — Progress Notes (Signed)
S:    Patient arrives in good spirits. Presents for lung function evaluation.   Patient reports breathing has been difficult lately.    Patient reports last dose of COPD medications was a dose of albuterol over the weekend (3 days prior to today).    O: CAT score= 8 See Documentation Flowsheet - CAT/COPD for complete symptom scoring.  See "scanned report" or Documentation Flowsheet (discrete results - PFTs) for  Spirometry results. Patient provided good effort while attempting spirometry.   Lung Age = 89 Albuterol Neb  Lot# [id number]     Exp. July 2017  A/P: Spirometry evaluation reveals moderately-severe restrictive lung disease however the threshold for obstruction was barely exceeded. She likely has a mixed picture of her lung disease. Post nebulized albuterol tx revealed no significant improvement in FVC or FEV1.   Patient has been experiencing shortness of breath for a few weeks and taking albuterol PRN.   No change to breathing medications at this time and will focus on smoking cessation.  Initiated nortriptyline 25 mg PO nightly and discontinued trazodone. Patient to pick up OTC nicotine replacement gum or lozenge for breakthrough cravings.  Counseled on side effects of medications. Reviewed results of pulmonary function tests.  Pt verbalized understanding of results and education.  Written pt instructions provided.  F/U Clinic visit in two weeks.   Total time in face to face counseling 60 minutes.  Patient seen with Randell Patient, PharmD Candidate, Milus Glazier,  PharmD Resident, and Nicoletta Ba, PharmD resident.  Marland Kitchen

## 2014-02-06 DIAGNOSIS — L723 Sebaceous cyst: Secondary | ICD-10-CM | POA: Diagnosis not present

## 2014-02-07 NOTE — Assessment & Plan Note (Signed)
Focus on smoking cessation.  Initiated nortriptyline 25 mg PO nightly and discontinued trazodone. Patient to pick up OTC nicotine replacement gum or lozenge for breakthrough cravings.  Counseled on side effects of medications.

## 2014-02-07 NOTE — Assessment & Plan Note (Signed)
Spirometry evaluation reveals moderately-severe restrictive lung disease however the threshold for obstruction was barely exceeded. She likely has a mixed picture of her lung disease. Post nebulized albuterol tx revealed no significant improvement in FVC or FEV1.   Patient has been experiencing shortness of breath for a few weeks and taking albuterol PRN.   No change to breathing medications at this time and will focus on smoking cessation.  Initiated nortriptyline 25 mg PO nightly and discontinued trazodone. Patient to pick up OTC nicotine replacement gum or lozenge for breakthrough cravings.  Counseled on side effects of medications. Reviewed results of pulmonary function tests.  Pt verbalized understanding of results and education.  Written pt instructions provided.  F/U Clinic visit in two weeks.   Total time in face to face counseling 60 minutes.  Patient seen with Randell Patient, PharmD Candidate, Milus Glazier,  PharmD Resident, and Nicoletta Ba, PharmD resident.

## 2014-02-08 NOTE — Progress Notes (Signed)
Patient ID: [name], female   DOB: Jan 23, 1965, 48 y.o.   MRN: [id number] Reviewed: Agree with Dr. Graylin Shiver documentation and management.

## 2014-02-19 ENCOUNTER — Encounter: Payer: Self-pay | Admitting: Family Medicine

## 2014-02-22 ENCOUNTER — Ambulatory Visit: Payer: Medicare Other | Admitting: Pharmacist

## 2014-02-27 DIAGNOSIS — L723 Sebaceous cyst: Secondary | ICD-10-CM | POA: Diagnosis not present

## 2014-02-27 DIAGNOSIS — L700 Acne vulgaris: Secondary | ICD-10-CM | POA: Diagnosis not present

## 2014-02-28 ENCOUNTER — Ambulatory Visit: Payer: Medicare Other | Admitting: Pharmacist

## 2014-03-05 ENCOUNTER — Ambulatory Visit (INDEPENDENT_AMBULATORY_CARE_PROVIDER_SITE_OTHER): Payer: Medicare Other | Admitting: Pharmacist

## 2014-03-05 ENCOUNTER — Encounter: Payer: Self-pay | Admitting: Pharmacist

## 2014-03-05 VITALS — BP 158/88 | HR 106 | Ht 69.0 in | Wt 192.8 lb

## 2014-03-05 DIAGNOSIS — R030 Elevated blood-pressure reading, without diagnosis of hypertension: Secondary | ICD-10-CM

## 2014-03-05 DIAGNOSIS — Z720 Tobacco use: Secondary | ICD-10-CM

## 2014-03-05 DIAGNOSIS — IMO0001 Reserved for inherently not codable concepts without codable children: Secondary | ICD-10-CM

## 2014-03-05 MED ORDER — BUPROPION HCL ER (XL) 150 MG PO TB24: 150.0000 mg | ORAL_TABLET | Freq: Every day | ORAL | Status: DC

## 2014-03-05 NOTE — Assessment & Plan Note (Signed)
Longstanding moderate nicotine dependence in a patient who is good candidate for success b/c of motivation and low cigarette intake. Unable to tolerate nortriptyline due to anticholinergic side effects. Discontinued nortriptyline. Initiated bupropion XL 150 mg every morning. Denies history of seizures. Patient counseled on purpose, proper use, and potential adverse effects, including insomnia, and potential change in mood. Patient to report any changes in mood. Patient to continue using nicotine lozenges for nicotine cravings and to find activities that she enjoys after meals to help with cravings after she eats. Patient set a goal of smoking 3 or less cigarettes by 05/06/14.

## 2014-03-05 NOTE — Assessment & Plan Note (Signed)
Elevated blood pressure: last two clinic blood pressure readings have been elevated. Today's elevated blood pressure confirmed with a manual reading of 158/88. Patient is not interested in starting any medications today and would like to follow up with Dr. Mingo Amber. Patient to schedule visit with Dr. Mingo Amber on way out. Will continue to monitor blood pressure at each visit.

## 2014-03-05 NOTE — Progress Notes (Signed)
S:  Patient arrives in good spirits. Patient arrives for follow up with tobacco dependence.   She reports that the nortriptyline caused her to have significant dry mouth and that she stopped taking it after about 4 days. She restarted taking her trazodone 100 mg nightly for insomnia. She is currently using nicotine lozenges to help with nicotine cravings. She reports that she craves a cigarette most after meals.   Reports smoking an average of 4 cigarettes per day (after meals). She reports that her worst day was smoking 6 cigarettes in one day. She has been using a text messaging program that will send her text messages about breathing exercises and other tips to help with smoking cessation.   She reports recent significant stress. Her sister has been in the hospital in [place] for diabetic complications. She reports that this is why she has missed her recent appointments with [place] and has had to reschedule. The stress has caused her to eat more which she feels like is a reason she has put on a few pounds recently. She also reports that her daughter recently turned 28 years old so she is looking forward to meeting her in [place] this weekend to celebrate.   She reports that she is interested in using nicotine replacement patches but is more interested in taking a pill if there is something available. She was told in the past to avoid Chantix by a psychologist. She reports that her bipolar disorder has been stable for a while.   She reports that she would feel wonderful if she quit for a week. If she would be able to quit for a year, it would dramatically change her life, as she would be more active and social. She describes herself as "closet smoker" in that she tries to hide it from others.    She reports that she would like to quit by her birthday, 05/06/2015.   O: BP Readings from Last 3 Encounters:  03/05/14 158/88  02/05/14 153/87  01/15/14 130/76     A/P: Longstanding moderate nicotine  dependence in a patient who is good candidate for success b/c of motivation and low cigarette intake. Unable to tolerate nortriptyline due to anticholinergic side effects. Discontinued nortriptyline. Initiated bupropion XL 150 mg every morning. Denies history of seizures. Patient counseled on purpose, proper use, and potential adverse effects, including insomnia, and potential change in mood. Patient to report any changes in mood. Patient to continue using nicotine lozenges for nicotine cravings and to find activities that she enjoys after meals to help with cravings after she eats. Patient set a goal of smoking 3 or less cigarettes by 05/06/14.   Elevated blood pressure: last two clinic blood pressure readings have been elevated. Today's elevated blood pressure confirmed with a manual reading of 158/88. Patient is not interested in starting any medications today and would like to follow up with Dr. Mingo Amber. Patient to schedule visit with Dr. Mingo Amber on way out. Will continue to monitor blood pressure at each visit.   Written information provided.  F/U Rx Clinic Visit late March 2016.  Total time in face-to-face counseling 30 minutes.  Patient seen with Nicoletta Ba, PharmD Resident.

## 2014-03-05 NOTE — Patient Instructions (Addendum)
It was great to see you today!  Congratulations on cutting back on your smoking!!!! Keep up the good work!!!  Stop the nortriptyline.  Start taking bupropion XL 150 mg once daily in the morning. Taking it in the morning will help lessen insomnia at night. Keep using the lozenges for nicotine cravings.   Find things that you enjoy to do after meals to distract you from nicotine cravings after meals.  Your goal is to smoke 3 or less cigarettes a day by your birthday   Call [place] at [phone] if you have any issues and asked to speak to pharmacy  Come back and see [place] at the end of March. Follow up with Dr. Mingo Amber about your blood pressure.

## 2014-03-05 NOTE — Progress Notes (Signed)
Patient ID: [name], female   DOB: October 11, 1965, 49 y.o.   MRN: [id number] Reviewed: Agree with Dr. Graylin Shiver documentation and management.

## 2014-04-04 ENCOUNTER — Ambulatory Visit: Payer: Medicare Other | Admitting: Pharmacist

## 2014-05-09 ENCOUNTER — Other Ambulatory Visit: Payer: Self-pay

## 2014-05-09 DIAGNOSIS — Z1231 Encounter for screening mammogram for malignant neoplasm of breast: Secondary | ICD-10-CM

## 2014-05-28 DIAGNOSIS — L810 Postinflammatory hyperpigmentation: Secondary | ICD-10-CM | POA: Diagnosis not present

## 2014-05-28 DIAGNOSIS — L0201 Cutaneous abscess of face: Secondary | ICD-10-CM | POA: Diagnosis not present

## 2014-05-28 DIAGNOSIS — L700 Acne vulgaris: Secondary | ICD-10-CM | POA: Diagnosis not present

## 2014-05-29 DIAGNOSIS — L0201 Cutaneous abscess of face: Secondary | ICD-10-CM | POA: Diagnosis not present

## 2014-05-31 ENCOUNTER — Ambulatory Visit
Admission: RE | Admit: 2014-05-31 | Discharge: 2014-05-31 | Disposition: A | Discharge status: Home / Self Care | Payer: Medicare Other | Source: Ambulatory Visit

## 2014-05-31 DIAGNOSIS — Z1231 Encounter for screening mammogram for malignant neoplasm of breast: Secondary | ICD-10-CM | POA: Diagnosis not present

## 2014-06-05 ENCOUNTER — Other Ambulatory Visit: Payer: Self-pay | Admitting: Family Medicine

## 2014-06-06 ENCOUNTER — Ambulatory Visit (INDEPENDENT_AMBULATORY_CARE_PROVIDER_SITE_OTHER): Payer: Medicare Other | Admitting: Family Medicine

## 2014-06-06 ENCOUNTER — Encounter: Payer: Self-pay | Admitting: Family Medicine

## 2014-06-06 VITALS — BP 153/79 | HR 109 | Temp 98.1°F | Ht 69.0 in | Wt 183.0 lb

## 2014-06-06 DIAGNOSIS — I10 Essential (primary) hypertension: Secondary | ICD-10-CM | POA: Diagnosis not present

## 2014-06-06 MED ORDER — HYDROCHLOROTHIAZIDE 12.5 MG PO CAPS: 12.5000 mg | ORAL_CAPSULE | Freq: Every day | ORAL | Status: DC

## 2014-06-06 NOTE — Patient Instructions (Signed)
Take the medication as prescribed.  Be sure to change your diet (increase fruits and veggies, decrease salt intake, etc).  Exercise 30 mins a day.  Continue to try to quit smoking.  Follow up 2 weeks - 1 month.   Take care,  Dr. Lacinda Axon

## 2014-06-06 NOTE — Assessment & Plan Note (Signed)
Patient meets diagnostic criteria for hypertension given previous and current elevation. We discussed lifestyle modifications including diet changes, exercise, and smoking cessation. We also discussed pharmacologic treatment and I am starting HCTZ today. I encouraged her to monitor her blood pressures at home. She is to follow-up in 2 weeks to 1 month for reevaluation with her PCP.

## 2014-06-06 NOTE — Progress Notes (Signed)
   Subjective:    Patient ID: [name], female    DOB: 18-Jun-1965, 49 y.o.   MRN: [id number]  HPI 49 year old female with a history of bipolar disorder, tobacco abuse, and elevated blood pressure presents for same day appointment complaints of elevated blood pressure.  1) Elevated BP  Patient has had elevated blood pressures in the past and has been resistant to treatment.  She was with her daughter at our office shows today and asked for her blood pressure to be checked by one of our staff members.    Her blood pressure checked and was found to be elevated with a systolic in the 122Q.  She presents today for evaluation of elevated blood pressure.  She states that she has had the above previous elevation and has had elevations in the past.  She is concerned and would like to discuss treatment options today.  She denies any chest pain, shortness breath, vision changes.  She is otherwise doing well.  Review of Systems Per HPI    Objective:   Physical Exam Filed Vitals:   06/06/14 0942  BP: 153/79  Pulse: 109  Temp: 98.1 F (36.7 C)   Vital signs reviewed.  Exam: General: well appearing, NAD. Cardiovascular: RRR. Soft systolic murmur noted at upper sternal border. Respiratory: CTAB. No rales, rhonchi, or wheeze.    Assessment & Plan:  See Problem List

## 2014-07-05 ENCOUNTER — Other Ambulatory Visit: Payer: Self-pay | Admitting: Family Medicine

## 2014-07-05 MED ORDER — HYDROCHLOROTHIAZIDE 12.5 MG PO CAPS: 12.5000 mg | ORAL_CAPSULE | Freq: Every day | ORAL | Status: DC

## 2014-07-12 ENCOUNTER — Other Ambulatory Visit: Payer: Self-pay

## 2014-07-12 ENCOUNTER — Ambulatory Visit (INDEPENDENT_AMBULATORY_CARE_PROVIDER_SITE_OTHER): Payer: Medicare Other | Admitting: Internal Medicine

## 2014-07-12 ENCOUNTER — Encounter: Payer: Self-pay | Admitting: Internal Medicine

## 2014-07-12 VITALS — BP 135/69 | HR 102 | Temp 98.4°F | Ht 69.0 in | Wt 181.0 lb

## 2014-07-12 DIAGNOSIS — N649 Disorder of breast, unspecified: Secondary | ICD-10-CM

## 2014-07-12 DIAGNOSIS — L988 Other specified disorders of the skin and subcutaneous tissue: Secondary | ICD-10-CM

## 2014-07-12 DIAGNOSIS — N63 Unspecified lump in unspecified breast: Secondary | ICD-10-CM

## 2014-07-12 MED ORDER — HYDROCHLOROTHIAZIDE 12.5 MG PO CAPS: 12.5000 mg | ORAL_CAPSULE | Freq: Every day | ORAL | Status: DC

## 2014-07-12 NOTE — Addendum Note (Signed)
Addended byMingo Amber, Kayleen Memos on: 07/12/2014 04:19 PM   Modules accepted: Orders

## 2014-07-12 NOTE — Progress Notes (Addendum)
   Subjective:    Patient ID: [name], female    DOB: 23-Feb-1965, 49 y.o.   MRN: [id number]  HPI Pt presents with dark spot on R breast that she noticed last night when she was getting in the shower. [facility] and she was told to come see her PCP. She had a mammogram in April of this year, which was normal. She denies any lumps or change in the shape of her breast. She denies any trauma to the area of the lesion. She denies any pain or itching of the lesion. She denies any new lesions or rashes elsewhere on her skin. She has not had any fevers.  Review of Systems See HPI    Objective:   Physical Exam  Constitutional: She appears well-developed and well-nourished. No distress.  Skin: Skin is warm and dry. No rash noted.  1 cm hyperpigmented papule on lower areola of R breast, no redness surrounding the lesion, no breast lumps are noted       Assessment & Plan:  This is a 48 year old female who presents with a new hyperpigmented lesion on the areola of her R breast. The lesion is most consistent with a seborrheic keratosis. -Patient will let us know in a few weeks if the lesion changes -May consider a future skin biopsy if lesion continues to grow -No mammogram is necessary at this time, as patient has just had a normal mammogram and the lesion is confined to the skin.

## 2014-07-12 NOTE — Patient Instructions (Signed)
It was nice meeting you! Thanks for coming into clinic today. Please let us know in a couple of weeks if the spot on your skin gets bigger, gets smaller, or stays the same. Also, let us know if you develop any fevers or redness in that area. We have also sent in a refill of your HCTZ.  Dr. Brett Albino

## 2014-07-23 ENCOUNTER — Ambulatory Visit: Payer: Medicare Other | Admitting: Family Medicine

## 2014-07-26 ENCOUNTER — Telehealth: Payer: Self-pay | Admitting: Family Medicine

## 2014-07-26 DIAGNOSIS — L309 Dermatitis, unspecified: Secondary | ICD-10-CM | POA: Diagnosis not present

## 2014-07-26 NOTE — Telephone Encounter (Signed)
LVM asking for a return call to [place]. Ottis Stain, CMA

## 2014-07-26 NOTE — Telephone Encounter (Signed)
Patient calling to update MD on her status

## 2014-09-11 ENCOUNTER — Other Ambulatory Visit: Payer: Self-pay | Admitting: Family Medicine

## 2014-09-30 ENCOUNTER — Other Ambulatory Visit: Payer: Self-pay | Admitting: *Deleted

## 2014-10-01 MED ORDER — HYDROCHLOROTHIAZIDE 12.5 MG PO CAPS: 12.5000 mg | ORAL_CAPSULE | Freq: Every day | ORAL | Status: DC

## 2014-10-15 ENCOUNTER — Other Ambulatory Visit: Payer: Self-pay | Admitting: Family Medicine

## 2014-10-17 ENCOUNTER — Ambulatory Visit (INDEPENDENT_AMBULATORY_CARE_PROVIDER_SITE_OTHER): Payer: Medicare Other | Admitting: *Deleted

## 2014-10-17 DIAGNOSIS — Z23 Encounter for immunization: Secondary | ICD-10-CM

## 2014-11-08 ENCOUNTER — Encounter: Payer: Self-pay | Admitting: Family Medicine

## 2014-11-08 ENCOUNTER — Ambulatory Visit (INDEPENDENT_AMBULATORY_CARE_PROVIDER_SITE_OTHER): Payer: Medicare Other | Admitting: Family Medicine

## 2014-11-08 VITALS — BP 126/76 | HR 104 | Temp 98.3°F | Ht 69.0 in | Wt 180.1 lb

## 2014-11-08 DIAGNOSIS — J22 Unspecified acute lower respiratory infection: Secondary | ICD-10-CM | POA: Insufficient documentation

## 2014-11-08 DIAGNOSIS — E785 Hyperlipidemia, unspecified: Secondary | ICD-10-CM | POA: Diagnosis not present

## 2014-11-08 DIAGNOSIS — R062 Wheezing: Secondary | ICD-10-CM | POA: Diagnosis not present

## 2014-11-08 DIAGNOSIS — Z720 Tobacco use: Secondary | ICD-10-CM

## 2014-11-08 DIAGNOSIS — J988 Other specified respiratory disorders: Secondary | ICD-10-CM

## 2014-11-08 LAB — CBC
HEMATOCRIT: 42.3 % (ref 36.0–46.0)
HEMOGLOBIN: 14.1 g/dL (ref 12.0–15.0)
MCH: 27.3 pg (ref 26.0–34.0)
MCHC: 33.3 g/dL (ref 30.0–36.0)
MCV: 81.8 fL (ref 78.0–100.0)
MPV: 9.2 fL (ref 8.6–12.4)
Platelets: 311 10*3/uL (ref 150–400)
RBC: 5.17 MIL/uL — ABNORMAL HIGH (ref 3.87–5.11)
RDW: 13.8 % (ref 11.5–15.5)
WBC: 10.4 10*3/uL (ref 4.0–10.5)

## 2014-11-08 LAB — COMPREHENSIVE METABOLIC PANEL
ALBUMIN: 4.2 g/dL (ref 3.6–5.1)
ALT: 14 U/L (ref 6–29)
AST: 18 U/L (ref 10–35)
Alkaline Phosphatase: 71 U/L (ref 33–130)
BUN: 12 mg/dL (ref 7–25)
CALCIUM: 9.5 mg/dL (ref 8.6–10.4)
CO2: 28 mmol/L (ref 20–31)
Chloride: 99 mmol/L (ref 98–110)
Creat: 1.03 mg/dL (ref 0.50–1.05)
Glucose, Bld: 87 mg/dL (ref 65–99)
POTASSIUM: 3.9 mmol/L (ref 3.5–5.3)
Sodium: 140 mmol/L (ref 135–146)
Total Bilirubin: 0.6 mg/dL (ref 0.2–1.2)
Total Protein: 7.3 g/dL (ref 6.1–8.1)

## 2014-11-08 LAB — TSH: TSH: 1.29 u[IU]/mL (ref 0.350–4.500)

## 2014-11-08 LAB — LIPID PANEL
CHOLESTEROL: 197 mg/dL (ref 125–200)
HDL: 45 mg/dL — ABNORMAL LOW (ref >=46)
LDL Cholesterol: 120 mg/dL (ref <130)
Total CHOL/HDL Ratio: 4.4 Ratio (ref <=5.0)
Triglycerides: 162 mg/dL — ABNORMAL HIGH (ref <150)
VLDL: 32 mg/dL — ABNORMAL HIGH (ref <30)

## 2014-11-08 MED ORDER — BUPROPION HCL ER (XL) 150 MG PO TB24: 150.0000 mg | ORAL_TABLET | Freq: Every day | ORAL | Status: DC

## 2014-11-08 MED ORDER — ALBUTEROL SULFATE HFA 108 (90 BASE) MCG/ACT IN AERS: 2.0000 | INHALATION_SPRAY | Freq: Four times a day (QID) | RESPIRATORY_TRACT | Status: DC | PRN

## 2014-11-08 MED ORDER — AZITHROMYCIN 250 MG PO TABS: ORAL_TABLET | ORAL | Status: DC

## 2014-11-08 MED ORDER — NICOTINE POLACRILEX 2 MG MT LOZG: 2.0000 mg | LOZENGE | OROMUCOSAL | Status: DC | PRN

## 2014-11-08 NOTE — Assessment & Plan Note (Signed)
"  asthma" exacerbation.   Requesting Z-pak. Discussed trying to just use albuterol for relief before antibiotics.   FU if no improvement.

## 2014-11-08 NOTE — Patient Instructions (Addendum)
Try using just the albuterol for about 24 hours and it may save you having to use the antibiotics.    Refill for the welbutrin today.  Come back and see me for a wellness exam.  Good to see you today!

## 2014-11-08 NOTE — Assessment & Plan Note (Signed)
No actual diagnosis of asthma  Needs PFTs when well.

## 2014-11-08 NOTE — Progress Notes (Signed)
Subjective:    [name] is a 49 y.o. female who presents to [facility] today for chest congestion:  1.  Chest congestion/runny nose and cough:  Has been traveling with her son in various aiports and generally feels "run down."  Has had increasing cough, productive of thick brown mucus, and worse at night.  Subjective chills.  Denies fever symptoms.  No N/V.  No diarrhea.  Hasn't used albuterol at home b/c this has expired.   2.  Smoking cessation:   Not doing as well as previously.  Back to about 1/2 pack per day.  Hasn't tried her welbutrin for some time.  Not using patch/gum.  Would like to try these again.  States stress of both children going off to college/graduate degree programs has increased her use of cigarettes.    ROS as above per HPI, otherwise neg.    The following portions of the patient's history were reviewed and updated as appropriate: allergies, current medications, past medical history, family and social history, and problem list. Patient is a current smoker.      PMH reviewed.  No past medical history on file. Past Surgical History  Procedure Laterality Date  . Endometriosis s/p left fallopian tube removal.    . Appendectomy      Medications reviewed. Current Outpatient Prescriptions  Medication Sig Dispense Refill  . albuterol (PROVENTIL HFA;VENTOLIN HFA) 108 (90 BASE) MCG/ACT inhaler Inhale 2 puffs into the lungs every 6 (six) hours as needed for wheezing. 1 Inhaler 2  . buPROPion (WELLBUTRIN XL) 150 MG 24 hr tablet Take 1 tablet (150 mg total) by mouth daily. 30 tablet 3  . hydrochlorothiazide (MICROZIDE) 12.5 MG capsule Take 1 capsule (12.5 mg total) by mouth daily. 30 capsule 1  . lamoTRIgine (LAMICTAL) 200 MG tablet TAKE 1 TABLET (200 MG TOTAL) BY MOUTH DAILY. 30 tablet 0  . nicotine polacrilex (COMMIT) 2 MG lozenge Take 2 mg by mouth as needed for smoking cessation.    . traZODone (DESYREL) 100 MG tablet Take 100 mg by mouth at bedtime.     No current  facility-administered medications for this visit.     Objective:   Physical Exam BP 126/76 mmHg  Pulse 104  Temp(Src) 98.3 F (36.8 C) (Oral)  Ht 5\' 9"  (1.753 m)  Wt 180 lb 1.6 oz (81.693 kg)  BMI 26.58 kg/m2  SpO2 97% Gen:  Patient sitting on exam table, appears stated age in no acute distress Head: Normocephalic atraumatic Eyes: EOMI, PERRL, sclera and conjunctiva non-erythematous Ears:  Canals clear bilaterally.  TMs pearly gray bilaterally without erythema or bulging.   Nose:  Nasal turbinates grossly enlarged bilaterally. Some exudates noted.  Mouth: Mucosa membranes moist. Tonsils +2, nonenlarged, non-erythematous. Neck: No cervical lymphadenopathy noted Heart:  RRR, no murmurs auscultated. Pulm:  Diffuse wheezing in all fields.     No results found for this or any previous visit (from the past 72 hour(s)).

## 2014-11-08 NOTE — Assessment & Plan Note (Signed)
Refill for buproprion.   Also refill for nicotine patch.  She still has gum at home. FU with me in a month to see how she's doing with this.   Goal is to quit completely by next spring.

## 2014-11-11 ENCOUNTER — Encounter: Payer: Self-pay | Admitting: Family Medicine

## 2014-11-14 ENCOUNTER — Other Ambulatory Visit: Payer: Self-pay | Admitting: Family Medicine

## 2014-12-19 ENCOUNTER — Other Ambulatory Visit: Payer: Self-pay | Admitting: Family Medicine

## 2015-02-08 ENCOUNTER — Other Ambulatory Visit: Payer: Self-pay | Admitting: Family Medicine

## 2015-02-26 DIAGNOSIS — M47816 Spondylosis without myelopathy or radiculopathy, lumbar region: Secondary | ICD-10-CM | POA: Diagnosis not present

## 2015-02-26 DIAGNOSIS — M4727 Other spondylosis with radiculopathy, lumbosacral region: Secondary | ICD-10-CM | POA: Diagnosis not present

## 2015-03-01 DIAGNOSIS — H1032 Unspecified acute conjunctivitis, left eye: Secondary | ICD-10-CM | POA: Diagnosis not present

## 2015-03-01 DIAGNOSIS — H10012 Acute follicular conjunctivitis, left eye: Secondary | ICD-10-CM | POA: Diagnosis not present

## 2015-03-20 ENCOUNTER — Ambulatory Visit: Payer: Medicare Other | Admitting: Family Medicine

## 2015-03-27 ENCOUNTER — Other Ambulatory Visit: Payer: Self-pay | Admitting: Family Medicine

## 2015-04-15 ENCOUNTER — Ambulatory Visit: Payer: Medicare Other | Admitting: Family Medicine

## 2015-04-24 DIAGNOSIS — M47816 Spondylosis without myelopathy or radiculopathy, lumbar region: Secondary | ICD-10-CM | POA: Diagnosis not present

## 2015-05-14 DIAGNOSIS — M47816 Spondylosis without myelopathy or radiculopathy, lumbar region: Secondary | ICD-10-CM | POA: Diagnosis not present

## 2015-06-11 ENCOUNTER — Other Ambulatory Visit: Payer: Self-pay

## 2015-06-11 DIAGNOSIS — Z1231 Encounter for screening mammogram for malignant neoplasm of breast: Secondary | ICD-10-CM

## 2015-06-26 ENCOUNTER — Ambulatory Visit: Payer: Medicare Other

## 2015-07-22 ENCOUNTER — Other Ambulatory Visit: Payer: Self-pay | Admitting: Family Medicine

## 2015-07-22 ENCOUNTER — Ambulatory Visit
Admission: RE | Admit: 2015-07-22 | Discharge: 2015-07-22 | Disposition: A | Discharge status: Home / Self Care | Payer: Medicare Other | Source: Ambulatory Visit

## 2015-07-22 DIAGNOSIS — Z1231 Encounter for screening mammogram for malignant neoplasm of breast: Secondary | ICD-10-CM

## 2015-07-29 ENCOUNTER — Other Ambulatory Visit: Payer: Self-pay | Admitting: Family Medicine

## 2015-08-06 ENCOUNTER — Other Ambulatory Visit: Payer: Self-pay | Admitting: *Deleted

## 2015-08-06 MED ORDER — HYDROCHLOROTHIAZIDE 12.5 MG PO CAPS: 12.5000 mg | ORAL_CAPSULE | Freq: Every day | ORAL | 2 refills | Status: DC

## 2015-08-06 NOTE — Telephone Encounter (Signed)
Refill request for 90 day supply.  Martin, Tamika L, RN  

## 2015-09-28 ENCOUNTER — Other Ambulatory Visit: Payer: Self-pay | Admitting: Family Medicine

## 2015-11-17 ENCOUNTER — Telehealth: Payer: Self-pay | Admitting: Family Medicine

## 2015-11-17 NOTE — Telephone Encounter (Signed)
Pt did not answer phone.

## 2015-11-25 ENCOUNTER — Encounter: Payer: Self-pay | Admitting: Family Medicine

## 2015-11-25 ENCOUNTER — Ambulatory Visit (INDEPENDENT_AMBULATORY_CARE_PROVIDER_SITE_OTHER): Payer: Medicare Other | Admitting: Family Medicine

## 2015-11-25 VITALS — BP 127/66 | HR 106 | Temp 97.9°F | Ht 69.0 in | Wt 182.0 lb

## 2015-11-25 DIAGNOSIS — Z7189 Other specified counseling: Secondary | ICD-10-CM | POA: Diagnosis not present

## 2015-11-25 DIAGNOSIS — Z720 Tobacco use: Secondary | ICD-10-CM | POA: Diagnosis not present

## 2015-11-25 DIAGNOSIS — I10 Essential (primary) hypertension: Secondary | ICD-10-CM

## 2015-11-25 DIAGNOSIS — Z23 Encounter for immunization: Secondary | ICD-10-CM

## 2015-11-25 DIAGNOSIS — F319 Bipolar disorder, unspecified: Secondary | ICD-10-CM | POA: Diagnosis not present

## 2015-11-25 LAB — COMPREHENSIVE METABOLIC PANEL
ALBUMIN: 4.1 g/dL (ref 3.6–5.1)
ALT: 10 U/L (ref 6–29)
AST: 17 U/L (ref 10–35)
Alkaline Phosphatase: 60 U/L (ref 33–130)
BILIRUBIN TOTAL: 0.4 mg/dL (ref 0.2–1.2)
BUN: 22 mg/dL (ref 7–25)
CALCIUM: 9.5 mg/dL (ref 8.6–10.4)
CHLORIDE: 102 mmol/L (ref 98–110)
CO2: 28 mmol/L (ref 20–31)
Creat: 1.01 mg/dL (ref 0.50–1.05)
Glucose, Bld: 87 mg/dL (ref 65–99)
Potassium: 3.8 mmol/L (ref 3.5–5.3)
Sodium: 139 mmol/L (ref 135–146)
Total Protein: 7 g/dL (ref 6.1–8.1)

## 2015-11-25 LAB — CBC WITH DIFFERENTIAL/PLATELET
BASOS ABS: 0 {cells}/uL (ref 0–200)
Basophils Relative: 0 %
EOS ABS: 0 {cells}/uL — AB (ref 15–500)
Eosinophils Relative: 0 %
HEMATOCRIT: 41.7 % (ref 35.0–45.0)
HEMOGLOBIN: 14 g/dL (ref 11.7–15.5)
LYMPHS ABS: 5040 {cells}/uL — AB (ref 850–3900)
Lymphocytes Relative: 48 %
MCH: 27.5 pg (ref 27.0–33.0)
MCHC: 33.6 g/dL (ref 32.0–36.0)
MCV: 81.9 fL (ref 80.0–100.0)
MONOS PCT: 6 %
MPV: 9.5 fL (ref 7.5–12.5)
Monocytes Absolute: 630 cells/uL (ref 200–950)
NEUTROS ABS: 4830 {cells}/uL (ref 1500–7800)
Neutrophils Relative %: 46 %
Platelets: 293 10*3/uL (ref 140–400)
RBC: 5.09 MIL/uL (ref 3.80–5.10)
RDW: 14.5 % (ref 11.0–15.0)
WBC: 10.5 10*3/uL (ref 3.8–10.8)

## 2015-11-25 LAB — LIPID PANEL
Cholesterol: 181 mg/dL (ref <200)
HDL: 50 mg/dL — ABNORMAL LOW (ref >50)
LDL CALC: 102 mg/dL — AB (ref <100)
Total CHOL/HDL Ratio: 3.6 Ratio (ref <5.0)
Triglycerides: 147 mg/dL (ref <150)
VLDL: 29 mg/dL (ref <30)

## 2015-11-25 LAB — TSH: TSH: 1.49 mIU/L

## 2015-11-25 MED ORDER — BUPROPION HCL ER (SR) 150 MG PO TB12
ORAL_TABLET | ORAL | 1 refills | Status: DC
Start: 2015-11-25 — End: 2016-12-22

## 2015-11-25 NOTE — Assessment & Plan Note (Signed)
Needs colonoscopy.  Information provided today on this.

## 2015-11-25 NOTE — Assessment & Plan Note (Signed)
She previously did well with Zyban. We will refill this for her today.

## 2015-11-25 NOTE — Patient Instructions (Signed)
It was good to see you again today!  We are checking labs today.  I will see you back in 6 months for repeat of your blood count because of the Lamictal.  We are providing information for your colonoscopy. Call them to schedule this.  Flu shot today as well.  Have a great holiday!

## 2015-11-25 NOTE — Progress Notes (Signed)
Subjective:    [name] is a 50 y.o. female who presents to [facility] today for HTN:  1.  Hypertension:  Long-term problem for this patient.  No adverse effects from medication.  Not checking it regularly.  No HA, CP, dizziness, shortness of breath, palpitations, or LE swelling.   BP Readings from Last 3 Encounters:  11/08/14 126/76  07/12/14 135/69  06/06/14 (!) 153/79   2.  Bipolar disorder type I. She is currently controlled as far as this goes. She is taking the maximum daily dose. She has had no manic episodes. States that her mood is good. No depressive symptoms. No hallucinations or suicidal ideation.  #3. Current smoker: States this is worsened. Would like trial of Wellbutrin again to help her quit smoking. She has quit previously in the past. Does have mild cough. Nonproductive. No recent allergy symptoms or attacks.   Prev health:  Currently overdue for flu shot and colonoscopy.  ROS as above per HPI.    The following portions of the patient's history were reviewed and updated as appropriate: allergies, current medications, past medical history, family and social history, and problem list. Patient is a nonsmoker.    PMH reviewed.  Past Medical History:  Diagnosis Date  . Pulmonary embolism ([place]) 1994   Only 1 clot, not on lifelong anticoagulation   Past Surgical History:  Procedure Laterality Date  . APPENDECTOMY  2001  . Endometriosis s/p left fallopian tube removal.  2001  . 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 Current Outpatient Prescriptions  Medication Sig Dispense Refill  . albuterol (PROVENTIL HFA;VENTOLIN HFA) 108 (90 BASE) MCG/ACT inhaler Inhale 2 puffs into the lungs every 6 (six) hours as needed for wheezing. 1 Inhaler 2  . azithromycin (ZITHROMAX) 250 MG tablet Take 2 tabs the first day and one until gone after that 6 tablet 0  . buPROPion (WELLBUTRIN XL) 150 MG 24 hr tablet Take 1 tablet (150 mg total) by  mouth daily. 30 tablet 3  . hydrochlorothiazide (MICROZIDE) 12.5 MG capsule Take 1 capsule (12.5 mg total) by mouth daily. 90 capsule 2  . lamoTRIgine (LAMICTAL) 200 MG tablet TAKE 1 TABLET BY MOUTH EVERY DAY 30 tablet 1  . nicotine polacrilex (COMMIT) 2 MG lozenge Take 1 lozenge (2 mg total) by mouth as needed for smoking cessation. 100 tablet 3  . traZODone (DESYREL) 100 MG tablet Take 100 mg by mouth at bedtime.    . traZODone (DESYREL) 100 MG tablet TAKE 1 TABLET BY MOUTH AT BEDTIME 60 tablet 5   No current facility-administered medications for this visit.      Objective:   Physical Exam Ht 5\' 9"  (1.753 m)   Wt 182 lb (82.6 kg)   BMI 26.88 kg/m  Gen:  Alert, cooperative patient who appears stated age in no acute distress.  Vital signs reviewed. Head: [place]/AT.   Eyes:  EOMI, PERRL.   Ears:  External ears WNL, Bilateral TM's normal without retraction, redness or bulging. Nose:  Septum midline  Mouth:  MMM, tonsils non-erythematous, non-edematous.   Neck:  No thryomegaly.  Cardiac:  Regular rate and rhythm  Pulm:  Clear to auscultation bilaterally with good air movement.  No wheezes or rales noted.   Abd:  Soft/nondistended/nontender.   Exts: Non edematous BL  LE, warm and well perfused.   No results found for this or any previous visit (from the past 72 hour(s)).

## 2015-11-25 NOTE — Assessment & Plan Note (Signed)
She is doing well from bipolar standpoint and mood disorder. Refill for her Lamictal. Discussed with her we should have more frequent monitoring of her CBC while on this medicine. Follow-up in 6 months for CBC.

## 2015-11-25 NOTE — Assessment & Plan Note (Signed)
Currently at goal Refill for hydrochlorothiazide today.

## 2015-11-26 ENCOUNTER — Telehealth: Payer: Self-pay | Admitting: Family Medicine

## 2015-11-26 NOTE — Telephone Encounter (Signed)
Called and relayed normal lab results to patient. She was appreciative of call.

## 2015-12-01 ENCOUNTER — Other Ambulatory Visit: Payer: Self-pay | Admitting: Family Medicine

## 2015-12-08 ENCOUNTER — Encounter: Payer: Self-pay | Admitting: Gastroenterology

## 2015-12-25 ENCOUNTER — Other Ambulatory Visit: Payer: Self-pay | Admitting: Family Medicine

## 2016-01-27 ENCOUNTER — Ambulatory Visit (AMBULATORY_SURGERY_CENTER): Payer: Self-pay

## 2016-01-27 VITALS — Ht 69.0 in | Wt 183.6 lb

## 2016-01-27 DIAGNOSIS — Z800 Family history of malignant neoplasm of digestive organs: Secondary | ICD-10-CM

## 2016-01-27 MED ORDER — NA SULFATE-K SULFATE-MG SULF 17.5-3.13-1.6 GM/177ML PO SOLN: ORAL | 0 refills | Status: DC

## 2016-01-27 NOTE — Progress Notes (Signed)
Per pt, no allergies to soy or egg products.Pt not taking any weight loss meds or using  O2 at home. 

## 2016-02-03 ENCOUNTER — Encounter: Payer: Self-pay | Admitting: Gastroenterology

## 2016-02-03 ENCOUNTER — Ambulatory Visit (AMBULATORY_SURGERY_CENTER): Payer: Medicare Other | Admitting: Gastroenterology

## 2016-02-03 VITALS — BP 112/52 | HR 93 | Temp 98.9°F | Resp 11 | Ht 69.0 in | Wt 183.0 lb

## 2016-02-03 DIAGNOSIS — I10 Essential (primary) hypertension: Secondary | ICD-10-CM | POA: Diagnosis not present

## 2016-02-03 DIAGNOSIS — E669 Obesity, unspecified: Secondary | ICD-10-CM | POA: Diagnosis not present

## 2016-02-03 DIAGNOSIS — Z1211 Encounter for screening for malignant neoplasm of colon: Secondary | ICD-10-CM | POA: Diagnosis not present

## 2016-02-03 DIAGNOSIS — Z1212 Encounter for screening for malignant neoplasm of rectum: Secondary | ICD-10-CM

## 2016-02-03 DIAGNOSIS — D125 Benign neoplasm of sigmoid colon: Secondary | ICD-10-CM | POA: Diagnosis not present

## 2016-02-03 DIAGNOSIS — K635 Polyp of colon: Secondary | ICD-10-CM | POA: Diagnosis not present

## 2016-02-03 DIAGNOSIS — D123 Benign neoplasm of transverse colon: Secondary | ICD-10-CM

## 2016-02-03 DIAGNOSIS — Z800 Family history of malignant neoplasm of digestive organs: Secondary | ICD-10-CM | POA: Diagnosis not present

## 2016-02-03 DIAGNOSIS — F319 Bipolar disorder, unspecified: Secondary | ICD-10-CM | POA: Diagnosis not present

## 2016-02-03 MED ORDER — SODIUM CHLORIDE 0.9 % IV SOLN: 500.0000 mL | INTRAVENOUS | Status: DC

## 2016-02-03 NOTE — Progress Notes (Signed)
Called to room to assist during endoscopic procedure.  Patient ID and intended procedure confirmed with present staff. Received instructions for my participation in the procedure from the performing physician.  

## 2016-02-03 NOTE — Progress Notes (Signed)
Report to PACU, RN, vss, BBS= Clear.  

## 2016-02-03 NOTE — Op Note (Signed)
[facility] Patient Name: [name] Procedure Date: 02/03/2016 11:30 AM MRN: [id number] Endoscopist: Mauri Pole , MD Age: 51 Referring MD:  Date of Birth: Feb 14, 1965 Gender: Female Account #: [id number] Procedure:                Colonoscopy Indications:              Screening for colorectal malignant neoplasm,                            Screening for colon cancer: Family history of                            colorectal cancer in distant relative(s) 66 or                            older, This is the patient's first colonoscopy Medicines:                Monitored Anesthesia Care Procedure:                Pre-Anesthesia Assessment:                           - Prior to the procedure, a History and Physical                            was performed, and patient medications and                            allergies were reviewed. The patient's tolerance of                            previous anesthesia was also reviewed. The risks                            and benefits of the procedure and the sedation                            options and risks were discussed with the patient.                            All questions were answered, and informed consent                            was obtained. Prior Anticoagulants: The patient has                            taken no previous anticoagulant or antiplatelet                            agents. ASA Grade Assessment: II - A patient with                            mild systemic disease. After reviewing the risks  and benefits, the patient was deemed in                            satisfactory condition to undergo the procedure.                           After obtaining informed consent, the colonoscope                            was passed under direct vision. Throughout the                            procedure, the patient's blood pressure, pulse, and                            oxygen saturations  were monitored continuously. The                            Model CF-HQ190L [id number]) scope was introduced                            through the anus and advanced to the the terminal                            ileum, with identification of the appendiceal                            orifice and IC valve. The colonoscopy was performed                            without difficulty. The patient tolerated the                            procedure well. The quality of the bowel                            preparation was adequate. The terminal ileum,                            ileocecal valve, appendiceal orifice, and rectum                            were photographed. Scope In: 11:37:31 AM Scope Out: 12:03:54 PM Scope Withdrawal Time: 0 hours 18 minutes 9 seconds  Total Procedure Duration: 0 hours 26 minutes 23 seconds  Findings:                 The perianal and digital rectal examinations were                            normal.                           A 2 mm polyp was found in the transverse colon. The  polyp was sessile. The polyp was removed with a                            cold biopsy forceps. Resection and retrieval were                            complete.                           Five sessile polyps were found in the sigmoid                            colonx 3 and transverse colon x2. The polyps were 4                            to 7 mm in size. These polyps were removed with a                            cold snare. Resection and retrieval were complete.                           Non-bleeding internal hemorrhoids were found during                            retroflexion. The hemorrhoids were small.                           The exam was otherwise without abnormality. Complications:            No immediate complications. Estimated Blood Loss:     Estimated blood loss: none. Impression:               - One 2 mm polyp in the transverse colon, removed                             with a cold biopsy forceps. Resected and retrieved.                           - Five 4 to 7 mm polyps in the sigmoid colon and in                            the transverse colon, removed with a cold snare.                            Resected and retrieved.                           - Non-bleeding internal hemorrhoids.                           - The examination was otherwise normal. Recommendation:           - Patient has a contact number available for  emergencies. The signs and symptoms of potential                            delayed complications were discussed with the                            patient. Return to normal activities tomorrow.                            Written discharge instructions were provided to the                            patient.                           - Resume previous diet.                           - Continue present medications.                           - Await pathology results.                           - Repeat colonoscopy in 3 - 5 years for                            surveillance based on pathology results.                           - Return to GI clinic PRN. Mauri Pole, MD 02/03/2016 12:07:58 PM This report has been signed electronically.

## 2016-02-03 NOTE — Patient Instructions (Signed)
YOU HAD AN ENDOSCOPIC PROCEDURE TODAY AT [facility]:   Refer to the procedure report that was given to you for any specific questions about what was found during the examination.  If the procedure report does not answer your questions, please call your gastroenterologist to clarify.  If you requested that your care partner not be given the details of your procedure findings, then the procedure report has been included in a sealed envelope for you to review at your convenience later.  YOU SHOULD EXPECT: Some feelings of bloating in the abdomen. Passage of more gas than usual.  Walking can help get rid of the air that was put into your GI tract during the procedure and reduce the bloating. If you had a lower endoscopy (such as a colonoscopy or flexible sigmoidoscopy) you may notice spotting of blood in your stool or on the toilet paper. If you underwent a bowel prep for your procedure, you may not have a normal bowel movement for a few days.  Please Note:  You might notice some irritation and congestion in your nose or some drainage.  This is from the oxygen used during your procedure.  There is no need for concern and it should clear up in a day or so.  SYMPTOMS TO REPORT IMMEDIATELY:   Following lower endoscopy (colonoscopy or flexible sigmoidoscopy):  Excessive amounts of blood in the stool  Significant tenderness or worsening of abdominal pains  Swelling of the abdomen that is new, acute  Fever of 100F or higher  For urgent or emergent issues, a gastroenterologist can be reached at any hour by calling [phone].   DIET:  We do recommend a small meal at first, but then you may proceed to your regular diet.  Drink plenty of fluids but you should avoid alcoholic beverages for 24 hours.  ACTIVITY:  You should plan to take it easy for the rest of today and you should NOT DRIVE or use heavy machinery until tomorrow (because of the sedation medicines used during the test).     FOLLOW UP: Our staff will call the number listed on your records the next business day following your procedure to check on you and address any questions or concerns that you may have regarding the information given to you following your procedure. If we do not reach you, we will leave a message.  However, if you are feeling well and you are not experiencing any problems, there is no need to return our call.  We will assume that you have returned to your regular daily activities without incident.  Polyps (handout given) Hemorrhoids (handout given) Await biopsy results to determined next repeat Colonoscopy, next 3-5 years repeat Colonoscopy  If any biopsies were taken you will be contacted by phone or by letter within the next 1-3 weeks.  Please call us at [phone] if you have not heard about the biopsies in 3 weeks.    SIGNATURES/CONFIDENTIALITY: You and/or your care partner have signed paperwork which will be entered into your electronic medical record.  These signatures attest to the fact that that the information above on your After Visit Summary has been reviewed and is understood.  Full responsibility of the confidentiality of this discharge information lies with you and/or your care-partner.

## 2016-02-04 ENCOUNTER — Telehealth: Payer: Self-pay

## 2016-02-04 NOTE — Telephone Encounter (Signed)
  Follow up Call-  Call back number 02/03/2016  Post procedure Call Back phone  # [phone]  Permission to leave phone message Yes  Some recent data might be hidden     Patient questions:  Do you have a fever, pain , or abdominal swelling? No. Pain Score  0 *  Have you tolerated food without any problems? Yes.    Have you been able to return to your normal activities? Yes.    Do you have any questions about your discharge instructions: Diet   No. Medications  No. Follow up visit  No.  Do you have questions or concerns about your Care? No.  Actions: * If pain score is 4 or above: No action needed, pain <4.

## 2016-02-05 ENCOUNTER — Telehealth: Payer: Self-pay | Admitting: Gastroenterology

## 2016-02-05 NOTE — Telephone Encounter (Signed)
Calls today with complaints of a new onset of bowel gas. She has developed this since the colonoscopy. Sis able to pass the gas but it is causing terrible pain. She has tried "walking it off", simethicone and bland diet. No nausea or vomiting. She is passing stool. What do you advise for her comfort?

## 2016-02-05 NOTE — Telephone Encounter (Signed)
She can take Phazyme 1-2 capsules TID as needed.

## 2016-02-06 ENCOUNTER — Encounter: Payer: Self-pay | Admitting: Gastroenterology

## 2016-02-06 NOTE — Telephone Encounter (Signed)
Patient is advised.  

## 2016-02-11 DIAGNOSIS — L700 Acne vulgaris: Secondary | ICD-10-CM | POA: Diagnosis not present

## 2016-02-16 ENCOUNTER — Encounter: Payer: Self-pay | Admitting: Internal Medicine

## 2016-02-16 ENCOUNTER — Ambulatory Visit (INDEPENDENT_AMBULATORY_CARE_PROVIDER_SITE_OTHER): Payer: Medicare Other | Admitting: Internal Medicine

## 2016-02-16 DIAGNOSIS — B9789 Other viral agents as the cause of diseases classified elsewhere: Secondary | ICD-10-CM

## 2016-02-16 DIAGNOSIS — J069 Acute upper respiratory infection, unspecified: Secondary | ICD-10-CM

## 2016-02-16 MED ORDER — ALBUTEROL SULFATE HFA 108 (90 BASE) MCG/ACT IN AERS: 2.0000 | INHALATION_SPRAY | Freq: Four times a day (QID) | RESPIRATORY_TRACT | 2 refills | Status: DC | PRN

## 2016-02-16 MED ORDER — GUAIFENESIN-DM 100-10 MG/5ML PO SYRP: 5.0000 mL | ORAL_SOLUTION | ORAL | 0 refills | Status: DC | PRN

## 2016-02-16 NOTE — Progress Notes (Signed)
   [facility] Phone: [phone]  Subjective:  [name] is a 51 year old female presenting to same day clinic with chills, low grade fever to 100.6, achy muscles, congestion, and rhinorrhea for the last 2 days. She denies any nausea, vomiting, or sore throat. She endorses cough productive of a small amount of clear phlegm. No sick contacts. She endorses mild shortness of breath. She has had wheezing in the past with viral illnesses and uses an Albuterol inhaler prn. She has been out of the Albuterol for a few weeks. She got the flu shot this year. She has tried taking Alka seltzer and Ibuprofen, which have been helping.  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 No changes.  Social history- patient is a current smoker  Objective: BP 110/60   Pulse (!) 118   Temp 98.2 F (36.8 C) (Oral)   Wt 183 lb (83 kg)   SpO2 95%   BMI 27.02 kg/m  Gen: NAD, alert, cooperative with exam HEENT: NCAT, EOMI, MMM, nasal turbinates erythematous, oropharynx erythematous without tonsillar exudates Neck: FROM, supple, no cervical lymphadenopathy CV: Tachycardic, regular rate, no murmur Resp: Normal work of breathing, mild diffuse expiratory wheezing.  Assessment/Plan: Viral URI with cough: Pt has been having low grade fever, chills, body aches, congestion, and sore throat. History and exam consistent with a viral URI. No signs of bacterial infection on exam. She may have the flu, but she is more well-appearing than I would expect for someone with the flu. - Will treat symptomatically - Advised Pt to use Ibuprofen very carefully, given that she only has one kidney. Would recommend Tylenol instead. - Robitussin DM prescribed for cough - Refilled Albuterol, which she uses prn for wheezing during URIs - Return precautions discussed - Follow-up if not  improving over the next 1-2 weeks.   Hyman Bible, MD PGY-2

## 2016-02-16 NOTE — Patient Instructions (Signed)
It was so nice to meet you!  I think you have a virus. I have prescribed some Robitussin for your cough. I have also refilled your Albuterol. You should use Tylenol as needed for fever or discomfort. You can also use hot tea with honey.  If you are not getting better over the next 1-2 weeks, please come back to see [place]!  -Dr. Brett Albino

## 2016-02-17 NOTE — Assessment & Plan Note (Signed)
Pt has been having low grade fever, chills, body aches, congestion, and sore throat. History and exam consistent with a viral URI. No signs of bacterial infection on exam. She may have the flu, but she is more well-appearing than I would expect for someone with the flu. - Will treat symptomatically - Advised Pt to use Ibuprofen very carefully, given that she only has one kidney. Would recommend Tylenol instead. - Robitussin DM prescribed for cough - Refilled Albuterol, which she uses prn for wheezing during URIs - Return precautions discussed - Follow-up if not improving over the next 1-2 weeks.

## 2016-03-18 DIAGNOSIS — L819 Disorder of pigmentation, unspecified: Secondary | ICD-10-CM | POA: Diagnosis not present

## 2016-03-18 DIAGNOSIS — Z411 Encounter for cosmetic surgery: Secondary | ICD-10-CM | POA: Diagnosis not present

## 2016-03-18 DIAGNOSIS — L820 Inflamed seborrheic keratosis: Secondary | ICD-10-CM | POA: Diagnosis not present

## 2016-04-13 DIAGNOSIS — H1045 Other chronic allergic conjunctivitis: Secondary | ICD-10-CM | POA: Diagnosis not present

## 2016-04-13 DIAGNOSIS — H1032 Unspecified acute conjunctivitis, left eye: Secondary | ICD-10-CM | POA: Diagnosis not present

## 2016-05-10 DIAGNOSIS — H18413 Arcus senilis, bilateral: Secondary | ICD-10-CM | POA: Diagnosis not present

## 2016-05-10 DIAGNOSIS — H1045 Other chronic allergic conjunctivitis: Secondary | ICD-10-CM | POA: Diagnosis not present

## 2016-05-10 DIAGNOSIS — S0500XA Injury of conjunctiva and corneal abrasion without foreign body, unspecified eye, initial encounter: Secondary | ICD-10-CM | POA: Diagnosis not present

## 2016-05-10 DIAGNOSIS — H10433 Chronic follicular conjunctivitis, bilateral: Secondary | ICD-10-CM | POA: Diagnosis not present

## 2016-05-10 DIAGNOSIS — H1013 Acute atopic conjunctivitis, bilateral: Secondary | ICD-10-CM | POA: Diagnosis not present

## 2016-05-10 DIAGNOSIS — H04123 Dry eye syndrome of bilateral lacrimal glands: Secondary | ICD-10-CM | POA: Diagnosis not present

## 2016-05-17 DIAGNOSIS — D485 Neoplasm of uncertain behavior of skin: Secondary | ICD-10-CM | POA: Diagnosis not present

## 2016-05-17 DIAGNOSIS — L281 Prurigo nodularis: Secondary | ICD-10-CM | POA: Diagnosis not present

## 2016-05-22 ENCOUNTER — Other Ambulatory Visit: Payer: Self-pay | Admitting: Family Medicine

## 2016-06-10 DIAGNOSIS — H35372 Puckering of macula, left eye: Secondary | ICD-10-CM | POA: Diagnosis not present

## 2016-06-10 DIAGNOSIS — H25013 Cortical age-related cataract, bilateral: Secondary | ICD-10-CM | POA: Diagnosis not present

## 2016-06-10 DIAGNOSIS — H524 Presbyopia: Secondary | ICD-10-CM | POA: Diagnosis not present

## 2016-06-10 DIAGNOSIS — H18413 Arcus senilis, bilateral: Secondary | ICD-10-CM | POA: Diagnosis not present

## 2016-06-10 DIAGNOSIS — H11423 Conjunctival edema, bilateral: Secondary | ICD-10-CM | POA: Diagnosis not present

## 2016-06-10 DIAGNOSIS — H04123 Dry eye syndrome of bilateral lacrimal glands: Secondary | ICD-10-CM | POA: Diagnosis not present

## 2016-06-10 DIAGNOSIS — H52223 Regular astigmatism, bilateral: Secondary | ICD-10-CM | POA: Diagnosis not present

## 2016-06-10 DIAGNOSIS — H5203 Hypermetropia, bilateral: Secondary | ICD-10-CM | POA: Diagnosis not present

## 2016-06-10 DIAGNOSIS — H2513 Age-related nuclear cataract, bilateral: Secondary | ICD-10-CM | POA: Diagnosis not present

## 2016-06-10 DIAGNOSIS — H11153 Pinguecula, bilateral: Secondary | ICD-10-CM | POA: Diagnosis not present

## 2016-06-11 ENCOUNTER — Other Ambulatory Visit: Payer: Self-pay | Admitting: Family Medicine

## 2016-06-11 DIAGNOSIS — Z1231 Encounter for screening mammogram for malignant neoplasm of breast: Secondary | ICD-10-CM

## 2016-06-25 ENCOUNTER — Other Ambulatory Visit: Payer: Self-pay | Admitting: Family Medicine

## 2016-07-12 ENCOUNTER — Ambulatory Visit (INDEPENDENT_AMBULATORY_CARE_PROVIDER_SITE_OTHER): Payer: Medicare Other | Admitting: Family Medicine

## 2016-07-12 ENCOUNTER — Encounter: Payer: Self-pay | Admitting: Family Medicine

## 2016-07-12 VITALS — BP 128/70 | HR 113 | Temp 98.2°F | Ht 69.0 in | Wt 186.8 lb

## 2016-07-12 DIAGNOSIS — Z5181 Encounter for therapeutic drug level monitoring: Secondary | ICD-10-CM | POA: Diagnosis not present

## 2016-07-12 DIAGNOSIS — F319 Bipolar disorder, unspecified: Secondary | ICD-10-CM | POA: Diagnosis not present

## 2016-07-12 DIAGNOSIS — R011 Cardiac murmur, unspecified: Secondary | ICD-10-CM | POA: Diagnosis not present

## 2016-07-12 NOTE — Assessment & Plan Note (Signed)
Stable. Doing well. Checking LFTs, white blood cell count today. If normal will revert back to just once yearly lab checks.

## 2016-07-12 NOTE — Progress Notes (Signed)
Subjective:    [name] is a 51 y.o. female who presents to [facility] today for FU for Bipolar:  1.  Bipolar Disorder:  Managed via Lamictal.  She was told she needed to have Q6 month labwork for her Lamictal. She has been taking this for years. She's not had any episodes or symptoms of depression or mania for at least the past 1-2 years. States her mood feels good. She takes her Lamictal daily and hasn't missed any doses.  No rash. No oral ulcerations. No oral blisters.  ROS as above per HPI.    The following portions of the patient's history were reviewed and updated as appropriate: allergies, current medications, past medical history, family and social history, and problem list. Patient is a nonsmoker.    PMH reviewed.  Past Medical History:  Diagnosis Date  . Bipolar 1 disorder ([name])   . Chronic kidney disease    donated left kidney  . Hypertension   . Pneumonia 2010   had 2 episodes/ was sent home on O2  . Pulmonary embolism ([place]) 1994   Only 1 clot, not on lifelong anticoagulation   Past Surgical History:  Procedure Laterality Date  . APPENDECTOMY  2001  . Endometriosis s/p left fallopian tube removal.  2001  . 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 TONSILLECTOMY      Medications reviewed. Current Outpatient Prescriptions  Medication Sig Dispense Refill  . albuterol (PROVENTIL HFA;VENTOLIN HFA) 108 (90 Base) MCG/ACT inhaler Inhale 2 puffs into the lungs every 6 (six) hours as needed for wheezing. 1 Inhaler 2  . buPROPion (WELLBUTRIN SR) 150 MG 12 hr tablet Take 1 tab po daily x 3 days, then 1 tab po BID for smoking cessation (Patient not taking: Reported on 01/27/2016) 60 tablet 1  . guaiFENesin-dextromethorphan (ROBITUSSIN DM) 100-10 MG/5ML syrup Take 5 mLs by mouth every 4 (four) hours as needed for cough. 118 mL 0  . hydrochlorothiazide (MICROZIDE) 12.5 MG capsule TAKE 1 CAPSULE (12.5 MG TOTAL) BY MOUTH DAILY. 90 capsule 2  . lamoTRIgine  (LAMICTAL) 200 MG tablet TAKE 1 TABLET BY MOUTH EVERY DAY 30 tablet 6  . nicotine polacrilex (COMMIT) 2 MG lozenge Take 1 lozenge (2 mg total) by mouth as needed for smoking cessation. (Patient not taking: Reported on 01/27/2016) 100 tablet 3  . traZODone (DESYREL) 100 MG tablet Take 100 mg by mouth at bedtime.     Current Facility-Administered Medications  Medication Dose Route Frequency Provider Last Rate Last Dose  . 0.9 %  sodium chloride infusion  500 mL Intravenous Continuous Nandigam, Kavitha V, MD         Objective:   Physical Exam BP 128/70   Pulse (!) 113   Temp 98.2 F (36.8 C) (Oral)   Ht 5\' 9"  (1.753 m)   Wt 186 lb 12.8 oz (84.7 kg)   SpO2 96%   BMI 27.59 kg/m  Gen:  Alert, cooperative patient who appears stated age in no acute distress.  Vital signs reviewed. HEENT: EOMI,  MMM. No oral lesions Cardiac:  Regular rate and rhythm with Grade II murmur Pulm:  Clear to auscultation bilaterally with good air movement.  No wheezes or rales noted.   Exts: Non edematous BL  LE, warm and well perfused. Skin: No rash on skin exam   No results found for this or any previous visit (from the past 72 hour(s)).

## 2016-07-12 NOTE — Assessment & Plan Note (Signed)
Heard on exam today. States she's had this for "years." Also with elevated pulse today.   Likely secondary to the heat outside and relative dehydration. States she's not had much to drink today. No palpitations or concerning signs or symptoms.

## 2016-07-12 NOTE — Patient Instructions (Signed)
It was very good to see you today!  We are checking labs.  If everything looks good, we will only need to do this once a year.  I'll see you back at the end of the year.  Of course, if you need something before then, do not hesitate to reach out.

## 2016-07-13 ENCOUNTER — Encounter: Payer: Self-pay | Admitting: Family Medicine

## 2016-07-13 LAB — CBC
HEMATOCRIT: 42.9 % (ref 34.0–46.6)
HEMOGLOBIN: 14 g/dL (ref 11.1–15.9)
MCH: 27.2 pg (ref 26.6–33.0)
MCHC: 32.6 g/dL (ref 31.5–35.7)
MCV: 83 fL (ref 79–97)
Platelets: 305 10*3/uL (ref 150–379)
RBC: 5.15 x10E6/uL (ref 3.77–5.28)
RDW: 14.3 % (ref 12.3–15.4)
WBC: 10.6 10*3/uL (ref 3.4–10.8)

## 2016-07-13 LAB — COMPREHENSIVE METABOLIC PANEL
ALBUMIN: 4.1 g/dL (ref 3.5–5.5)
ALT: 13 IU/L (ref 0–32)
AST: 15 IU/L (ref 0–40)
Albumin/Globulin Ratio: 1.5 (ref 1.2–2.2)
Alkaline Phosphatase: 71 IU/L (ref 39–117)
BUN/Creatinine Ratio: 18 (ref 9–23)
BUN: 17 mg/dL (ref 6–24)
Bilirubin Total: <0.2 mg/dL (ref 0.0–1.2)
CALCIUM: 9.5 mg/dL (ref 8.7–10.2)
CO2: 25 mmol/L (ref 20–29)
CREATININE: 0.96 mg/dL (ref 0.57–1.00)
Chloride: 102 mmol/L (ref 96–106)
GFR calc Af Amer: 79 mL/min/{1.73_m2} (ref >59)
GFR, EST NON AFRICAN AMERICAN: 68 mL/min/{1.73_m2} (ref >59)
GLOBULIN, TOTAL: 2.8 g/dL (ref 1.5–4.5)
Glucose: 93 mg/dL (ref 65–99)
Potassium: 4 mmol/L (ref 3.5–5.2)
SODIUM: 144 mmol/L (ref 134–144)
Total Protein: 6.9 g/dL (ref 6.0–8.5)

## 2016-07-22 ENCOUNTER — Ambulatory Visit: Payer: Medicare Other

## 2016-07-29 ENCOUNTER — Ambulatory Visit
Admission: RE | Admit: 2016-07-29 | Discharge: 2016-07-29 | Disposition: A | Discharge status: Home / Self Care | Payer: Medicare Other | Source: Ambulatory Visit | Attending: Family Medicine | Admitting: Family Medicine

## 2016-07-29 DIAGNOSIS — Z1231 Encounter for screening mammogram for malignant neoplasm of breast: Secondary | ICD-10-CM

## 2016-09-22 ENCOUNTER — Ambulatory Visit: Payer: Medicare Other | Admitting: Family Medicine

## 2016-10-24 ENCOUNTER — Ambulatory Visit (HOSPITAL_COMMUNITY)
Admission: EM | Admit: 2016-10-24 | Discharge: 2016-10-24 | Disposition: A | Discharge status: Home / Self Care | Payer: Medicare Other | Attending: Family Medicine | Admitting: Family Medicine

## 2016-10-24 ENCOUNTER — Telehealth (HOSPITAL_COMMUNITY): Payer: Self-pay | Admitting: *Deleted

## 2016-10-24 ENCOUNTER — Encounter (HOSPITAL_COMMUNITY): Payer: Self-pay | Admitting: Family Medicine

## 2016-10-24 DIAGNOSIS — J441 Chronic obstructive pulmonary disease with (acute) exacerbation: Secondary | ICD-10-CM

## 2016-10-24 MED ORDER — ALBUTEROL SULFATE HFA 108 (90 BASE) MCG/ACT IN AERS: 2.0000 | INHALATION_SPRAY | Freq: Four times a day (QID) | RESPIRATORY_TRACT | 5 refills | Status: DC | PRN

## 2016-10-24 MED ORDER — HYDROCODONE-HOMATROPINE 5-1.5 MG/5ML PO SYRP: 5.0000 mL | ORAL_SOLUTION | Freq: Four times a day (QID) | ORAL | 0 refills | Status: DC | PRN

## 2016-10-24 MED ORDER — AZITHROMYCIN 250 MG PO TABS: 250.0000 mg | ORAL_TABLET | Freq: Every day | ORAL | 0 refills | Status: DC

## 2016-10-24 NOTE — ED Triage Notes (Signed)
Pt c/o cold sx onset 3 days associated w/prod cough... Reports cough is worse at night  Taking OTC cold meds w/temp relief  Smokes 0.5 PPD  A&O x4... NAD... Ambulatory

## 2016-10-24 NOTE — ED Provider Notes (Signed)
[place]    CSN: [id number] Arrival date & time: 10/24/16  1201     History   Chief Complaint Chief Complaint  Patient presents with  . URI    HPI [name] is a 51 y.o. female.   This 51 year old woman with a history of COPD who has had productive cough that is worsening over the last 3 days. She does have a history of pulmonary emboli after she gave a kidney relative over 10 years ago.  Patient's noted no hemoptysis and no shortness of breath. She has no history of asthma. She denies fever.  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      Past Medical History:  Diagnosis Date  . Bipolar 1 disorder ([place])   . Chronic kidney disease    donated left kidney  . Hypertension   . Pneumonia 2010   had 2 episodes/ was sent home on O2  . Pulmonary embolism ([place]) 1994   Only 1 clot, not on lifelong anticoagulation    Patient Active Problem List   Diagnosis Date Noted  . Undiagnosed cardiac murmurs 07/12/2016  . Wheezing 11/08/2014  . HTN (hypertension) 06/06/2014  . Hair thinning 01/15/2014  . Counseling on health promotion and disease prevention 08/17/2013  . Insomnia 11/13/2012  . Currently attempting to quit smoking 06/16/2011  . Bipolar 1 disorder ([place]) 08/17/2010  . 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 APPENDECTOMY  2001  . Endometriosis s/p left fallopian tube removal.  2001  . 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 TONSILLECTOMY      OB History    No data available       Home Medications    Prior to Admission medications   Medication Sig Start Date End Date Taking? Authorizing Provider  hydrochlorothiazide (MICROZIDE) 12.5 MG capsule TAKE 1 CAPSULE (12.5 MG TOTAL) BY MOUTH DAILY. 05/24/16  Yes Alveda Reasons, MD  lamoTRIgine (LAMICTAL) 200 MG tablet TAKE 1 TABLET BY MOUTH EVERY DAY 06/25/16  Yes Alveda Reasons, MD  traZODone (DESYREL) 100 MG tablet  Take 100 mg by mouth at bedtime.   Yes [provider]  albuterol (PROVENTIL HFA;VENTOLIN HFA) 108 (90 Base) MCG/ACT inhaler Inhale 2 puffs into the lungs every 6 (six) hours as needed for wheezing. 10/24/16   Robyn Haber, MD  azithromycin (ZITHROMAX) 250 MG tablet Take 1 tablet (250 mg total) by mouth daily. Take first 2 tablets together, then 1 every day until finished. 10/24/16   Robyn Haber, MD  buPROPion (WELLBUTRIN SR) 150 MG 12 hr tablet Take 1 tab po daily x 3 days, then 1 tab po BID for smoking cessation 11/25/15   Alveda Reasons, MD  guaiFENesin-dextromethorphan [facility] DM) 100-10 MG/5ML syrup Take 5 mLs by mouth every 4 (four) hours as needed for cough. 02/16/16   Mayo, Pete Pelt, MD  HYDROcodone-homatropine (HYDROMET) 5-1.5 MG/5ML syrup Take 5 mLs by mouth every 6 (six) hours as needed for cough. 10/24/16   Robyn Haber, MD  nicotine polacrilex (COMMIT) 2 MG lozenge Take 1 lozenge (2 mg total) by mouth as needed for smoking cessation. Patient not taking: Reported on 01/27/2016 11/08/14   Alveda Reasons, MD    Family History Family History  Problem Relation Age of Onset  . Breast cancer Mother   . Breast cancer Maternal Grandmother   . Diabetes Sister   . Kidney disease Sister   .  Colon cancer Paternal Grandmother     Social History Social History  Substance Use Topics  . Smoking status: Current Every Day Smoker    Packs/day: 0.50    Years: 10.00    Types: Cigarettes  . Smokeless tobacco: Never Used  . Alcohol use No     Allergies   Iohexol; Morphine and related; and Sulfa antibiotics   Review of Systems Review of Systems  Respiratory: Positive for cough.   All other systems reviewed and are negative.    Physical Exam Triage Vital Signs ED Triage Vitals  Enc Vitals Group     BP 10/24/16 1206 124/73     Pulse Rate 10/24/16 1206 (!) 112     Resp 10/24/16 1206 20     Temp 10/24/16 1206 97.9 F (36.6 C)     Temp Source 10/24/16  1206 Oral     SpO2 10/24/16 1206 98 %     Weight --      Height --      Head Circumference --      Peak Flow --      Pain Score 10/24/16 1208 7     Pain Loc --      Pain Edu? --      Excl. in [place]? --    No data found.   Updated Vital Signs BP 124/73 (BP Location: Left Arm)   Pulse (!) 112   Temp 97.9 F (36.6 C) (Oral)   Resp 20   SpO2 98%      Physical Exam  Constitutional: She is oriented to person, place, and time. She appears well-developed and well-nourished.  HENT:  Head: Normocephalic.  Right Ear: External ear normal.  Left Ear: External ear normal.  Mouth/Throat: Oropharynx is clear and moist.  Eyes: Pupils are equal, round, and reactive to light.  Neck: Normal range of motion. Neck supple. No thyromegaly present.  Cardiovascular: Regular rhythm and normal heart sounds.   Pulmonary/Chest: Effort normal. She has wheezes.  Musculoskeletal: Normal range of motion.  Lymphadenopathy:    She has no cervical adenopathy.  Neurological: She is alert and oriented to person, place, and time.  Skin: Skin is warm and dry.  Nursing note and vitals reviewed.    UC Treatments / Results  Labs (all labs ordered are listed, but only abnormal results are displayed) Labs Reviewed - No data to display  EKG  EKG Interpretation None       Radiology No results found.  Procedures Procedures (including critical care time)  Medications Ordered in UC Medications - No data to display   Initial Impression / Assessment and Plan / UC Course  I have reviewed the triage vital signs and the nursing notes.  Pertinent labs & imaging results that were available during my care of the patient were reviewed by me and considered in my medical decision making (see chart for details).     Final Clinical Impressions(s) / UC Diagnoses   Final diagnoses:  COPD exacerbation (HCC)    New Prescriptions New Prescriptions   AZITHROMYCIN (ZITHROMAX) 250 MG TABLET    Take 1 tablet  (250 mg total) by mouth daily. Take first 2 tablets together, then 1 every day until finished.   HYDROCODONE-HOMATROPINE (HYDROMET) 5-1.5 MG/5ML SYRUP    Take 5 mLs by mouth every 6 (six) hours as needed for cough.     Controlled Substance Prescriptions [place] Controlled Substance Registry consulted? Not Applicable   Robyn Haber, MD 10/24/16 1217

## 2016-11-06 ENCOUNTER — Encounter (HOSPITAL_COMMUNITY): Payer: Self-pay | Admitting: *Deleted

## 2016-11-06 ENCOUNTER — Emergency Department (HOSPITAL_COMMUNITY)
Admission: EM | Admit: 2016-11-06 | Discharge: 2016-11-06 | Disposition: A | Discharge status: Home / Self Care | Payer: Medicare Other | Attending: Emergency Medicine | Admitting: Emergency Medicine

## 2016-11-06 ENCOUNTER — Emergency Department (HOSPITAL_COMMUNITY): Payer: Medicare Other

## 2016-11-06 ENCOUNTER — Ambulatory Visit (INDEPENDENT_AMBULATORY_CARE_PROVIDER_SITE_OTHER): Payer: Medicare Other

## 2016-11-06 ENCOUNTER — Ambulatory Visit (HOSPITAL_COMMUNITY)
Admission: EM | Admit: 2016-11-06 | Discharge: 2016-11-06 | Disposition: A | Discharge status: Home / Self Care | Payer: Medicare Other | Source: Home / Self Care

## 2016-11-06 ENCOUNTER — Encounter (HOSPITAL_COMMUNITY): Payer: Self-pay | Admitting: Family Medicine

## 2016-11-06 DIAGNOSIS — F17210 Nicotine dependence, cigarettes, uncomplicated: Secondary | ICD-10-CM | POA: Insufficient documentation

## 2016-11-06 DIAGNOSIS — R109 Unspecified abdominal pain: Secondary | ICD-10-CM | POA: Diagnosis not present

## 2016-11-06 DIAGNOSIS — Z524 Kidney donor: Secondary | ICD-10-CM | POA: Diagnosis not present

## 2016-11-06 DIAGNOSIS — Z79899 Other long term (current) drug therapy: Secondary | ICD-10-CM | POA: Diagnosis not present

## 2016-11-06 DIAGNOSIS — I129 Hypertensive chronic kidney disease with stage 1 through stage 4 chronic kidney disease, or unspecified chronic kidney disease: Secondary | ICD-10-CM | POA: Insufficient documentation

## 2016-11-06 DIAGNOSIS — J449 Chronic obstructive pulmonary disease, unspecified: Secondary | ICD-10-CM | POA: Diagnosis not present

## 2016-11-06 DIAGNOSIS — J189 Pneumonia, unspecified organism: Secondary | ICD-10-CM | POA: Insufficient documentation

## 2016-11-06 DIAGNOSIS — R079 Chest pain, unspecified: Secondary | ICD-10-CM

## 2016-11-06 DIAGNOSIS — N189 Chronic kidney disease, unspecified: Secondary | ICD-10-CM | POA: Diagnosis not present

## 2016-11-06 DIAGNOSIS — R0781 Pleurodynia: Secondary | ICD-10-CM | POA: Diagnosis not present

## 2016-11-06 LAB — POCT URINALYSIS DIP (DEVICE)
Bilirubin Urine: NEGATIVE
Glucose, UA: NEGATIVE mg/dL
KETONES UR: NEGATIVE mg/dL
Leukocytes, UA: NEGATIVE
NITRITE: NEGATIVE
PH: 6.5 (ref 5.0–8.0)
PROTEIN: 100 mg/dL — AB
Specific Gravity, Urine: <=1.005 (ref 1.005–1.030)
Urobilinogen, UA: 0.2 mg/dL (ref 0.0–1.0)

## 2016-11-06 LAB — BASIC METABOLIC PANEL
Anion gap: 9 (ref 5–15)
BUN: 18 mg/dL (ref 6–20)
CALCIUM: 9.2 mg/dL (ref 8.9–10.3)
CO2: 28 mmol/L (ref 22–32)
CREATININE: 1.03 mg/dL — AB (ref 0.44–1.00)
Chloride: 100 mmol/L — ABNORMAL LOW (ref 101–111)
Glucose, Bld: 99 mg/dL (ref 65–99)
Potassium: 3.8 mmol/L (ref 3.5–5.1)
SODIUM: 137 mmol/L (ref 135–145)

## 2016-11-06 LAB — CBC
HCT: 41.6 % (ref 36.0–46.0)
Hemoglobin: 13.8 g/dL (ref 12.0–15.0)
MCH: 27.7 pg (ref 26.0–34.0)
MCHC: 33.2 g/dL (ref 30.0–36.0)
MCV: 83.4 fL (ref 78.0–100.0)
PLATELETS: 385 10*3/uL (ref 150–400)
RBC: 4.99 MIL/uL (ref 3.87–5.11)
RDW: 14 % (ref 11.5–15.5)
WBC: 13.3 10*3/uL — AB (ref 4.0–10.5)

## 2016-11-06 LAB — I-STAT TROPONIN, ED: TROPONIN I, POC: 0.01 ng/mL (ref 0.00–0.08)

## 2016-11-06 MED ORDER — STERILE WATER FOR INJECTION IJ SOLN
INTRAMUSCULAR | Status: AC
  Administered 2016-11-06: 10 mL
  Filled 2016-11-06: qty 10

## 2016-11-06 MED ORDER — HYDROCORTISONE NA SUCCINATE PF 100 MG IJ SOLR
200.0000 mg | Freq: Once | INTRAMUSCULAR | Status: AC
  Administered 2016-11-06: 200 mg via INTRAVENOUS
  Filled 2016-11-06: qty 4

## 2016-11-06 MED ORDER — ONDANSETRON HCL 4 MG/2ML IJ SOLN
4.0000 mg | Freq: Once | INTRAMUSCULAR | Status: AC
  Administered 2016-11-06: 4 mg via INTRAVENOUS
  Filled 2016-11-06: qty 2

## 2016-11-06 MED ORDER — HYDROMORPHONE HCL 1 MG/ML IJ SOLN
0.5000 mg | Freq: Once | INTRAMUSCULAR | Status: AC
  Administered 2016-11-06: 0.5 mg via INTRAVENOUS
  Filled 2016-11-06: qty 1

## 2016-11-06 MED ORDER — LEVOFLOXACIN 750 MG PO TABS
750.0000 mg | ORAL_TABLET | Freq: Every day | ORAL | Status: DC
  Administered 2016-11-06: 750 mg via ORAL
  Filled 2016-11-06: qty 1

## 2016-11-06 MED ORDER — AZITHROMYCIN 250 MG PO TABS: 250.0000 mg | ORAL_TABLET | Freq: Every day | ORAL | 0 refills | Status: DC

## 2016-11-06 MED ORDER — LEVOFLOXACIN 750 MG PO TABS: 750.0000 mg | ORAL_TABLET | Freq: Every day | ORAL | 0 refills | Status: DC

## 2016-11-06 MED ORDER — DIPHENHYDRAMINE HCL 50 MG/ML IJ SOLN
50.0000 mg | Freq: Once | INTRAMUSCULAR | Status: AC
  Administered 2016-11-06: 50 mg via INTRAVENOUS
  Filled 2016-11-06: qty 1

## 2016-11-06 MED ORDER — IOPAMIDOL (ISOVUE-370) INJECTION 76%
INTRAVENOUS | Status: AC
  Administered 2016-11-06: 100 mL via INTRAVENOUS
  Filled 2016-11-06: qty 100

## 2016-11-06 NOTE — Discharge Instructions (Signed)
I'm concerned that with the location of the pain and the changes on the x-ray, you may have developed a clot in the lung.  Therefore, I am directing you to the emergency department for further evaluation.

## 2016-11-06 NOTE — ED Notes (Signed)
Patient transported to CT 

## 2016-11-06 NOTE — ED Notes (Signed)
The pt wants something for pain 

## 2016-11-06 NOTE — ED Provider Notes (Signed)
[place]   [id number] 11/06/16 Arrival Time: 1201   SUBJECTIVE:  [name] is a 51 y.o. female who presents to the urgent care with complaint of right flank and lower rib pain which began spontaneously yesterday.  No nausea, vomiting, diarrhea, cough, fever, sneezing, change in activity such as moving furniture, prior hx of similar pain.  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   Past Medical History:  Diagnosis Date  . Bipolar 1 disorder ([place])   . Chronic kidney disease    donated left kidney  . Hypertension   . Pneumonia 2010   had 2 episodes/ was sent home on O2  . Pulmonary embolism ([place]) 1994   Only 1 clot, not on lifelong anticoagulation   Family History  Problem Relation Age of Onset  . Breast cancer Mother   . Breast cancer Maternal Grandmother   . Diabetes Sister   . Kidney disease Sister   . Colon cancer Paternal Grandmother    Social History   Social History  . Marital status: Divorced    Spouse name: N/A  . Number of children: N/A  . Years of education: N/A   Occupational History  . Not on file.   Social History Main Topics  . Smoking status: Current Every Day Smoker    Packs/day: 0.50    Years: 10.00    Types: Cigarettes  . Smokeless tobacco: Never Used  . Alcohol use No  . Drug use: No  . Sexual activity: Yes    Birth control/ protection: Condom     Comment: Partner with Vasectomy   Other Topics Concern  . Not on file   Social History Narrative  . No narrative on file   Current Facility-Administered Medications for the 11/06/16 encounter [facility] Encounter)  Medication  . 0.9 %  sodium chloride infusion   Current Meds  Medication Sig  . albuterol (PROVENTIL HFA;VENTOLIN HFA) 108 (90 Base) MCG/ACT inhaler Inhale 2 puffs into the lungs every 6 (six) hours as needed for wheezing.  Marland Kitchen buPROPion (WELLBUTRIN SR) 150 MG 12 hr tablet Take 1 tab po daily x 3 days, then 1 tab po BID for  smoking cessation  . hydrochlorothiazide (MICROZIDE) 12.5 MG capsule TAKE 1 CAPSULE (12.5 MG TOTAL) BY MOUTH DAILY.  Marland Kitchen lamoTRIgine (LAMICTAL) 200 MG tablet TAKE 1 TABLET BY MOUTH EVERY DAY  . nicotine polacrilex (COMMIT) 2 MG lozenge Take 1 lozenge (2 mg total) by mouth as needed for smoking cessation.  . traZODone (DESYREL) 100 MG tablet Take 100 mg by mouth at bedtime.  . [DISCONTINUED] HYDROcodone-homatropine (HYDROMET) 5-1.5 MG/5ML syrup Take 5 mLs by mouth every 6 (six) hours as needed for cough.   Allergies  Allergen Reactions  . Iohexol Hives    Hives 06/06/04, needs pre meds   . Morphine And Related Hives and Itching    Denies Airway involvement  . Sulfa Antibiotics Itching and Rash    Reported with TMP/ SMX  Denies Airway involvement      ROS: As per HPI, remainder of ROS negative.   OBJECTIVE:   Vitals:   11/06/16 1211  BP: 125/73  Pulse: (!) 114  Resp: 18  Temp: 98.5 F (36.9 C)  TempSrc: Oral  SpO2: 95%     General appearance: alert; no distress Eyes: PERRL; EOMI; conjunctiva normal HENT: normocephalic; atraumatic;  external ears normal without trauma; nasal mucosa normal; oral mucosa normal Neck: supple Lungs: clear to auscultation bilaterally; tender right antero-lateral  rib area. Heart: regular rate and rhythm Abdomen: soft, non-tender; bowel sounds normal; no masses or organomegaly; no guarding or rebound tenderness Back: no CVA tenderness; well healed scoliosis surgery scars. Extremities: no cyanosis or edema; symmetrical with no gross deformities Skin: warm and dry Neurologic: normal gait; grossly normal Psychological: alert and cooperative; normal mood and affect      Labs:  Results for orders placed or performed during the hospital encounter of 11/06/16  POCT urinalysis dip (device)  Result Value Ref Range   Glucose, UA NEGATIVE NEGATIVE mg/dL   Bilirubin Urine NEGATIVE NEGATIVE   Ketones, ur NEGATIVE NEGATIVE mg/dL   Specific Gravity,  Urine <=1.005 1.005 - 1.030   Hgb urine dipstick SMALL (A) NEGATIVE   pH 6.5 5.0 - 8.0   Protein, ur 100 (A) NEGATIVE mg/dL   Urobilinogen, UA 0.2 0.0 - 1.0 mg/dL   Nitrite NEGATIVE NEGATIVE   Leukocytes, UA NEGATIVE NEGATIVE    Labs Reviewed  POCT URINALYSIS DIP (DEVICE) - Abnormal; Notable for the following:       Result Value   Hgb urine dipstick SMALL (*)    Protein, ur 100 (*)    All other components within normal limits    No results found.     ASSESSMENT & PLAN:  1. Right flank pain     Directed to the ED Reviewed expectations re: course of current medical issues. Questions answered. Outlined signs and symptoms indicating need for more acute intervention. Patient verbalized understanding. After Visit Summary given.    Procedures:      Robyn Haber, MD 11/06/16 1251

## 2016-11-06 NOTE — ED Triage Notes (Signed)
Pt c/o pain  Under R  arm yesterday that is now hurting under her R breast, denies SOB, pain onset last night, sent her from UC for r/o PE, hx of PE, pt denies n/v/d, A&O x4

## 2016-11-06 NOTE — ED Provider Notes (Signed)
[facility] EMERGENCY DEPARTMENT Provider Note CSN: [id number] Arrival date & time: 11/06/16  1321 History   Chief Complaint Chief Complaint  Patient presents with  . Chest Pain    pain under R arm & R breast   HPI [name] is a 51 y.o. female.  The history is provided by the patient.  Chest Pain   This is a new problem. The current episode started yesterday. The problem occurs constantly. The problem has not changed since onset.Pain location: right lower chest, underneath her breast. The pain is moderate. The quality of the pain is described as sharp. The pain does not radiate. Duration of episode(s) is 1 day. Exacerbated by: nothing. Pertinent negatives include no abdominal pain, no back pain, no cough, no fever, no palpitations, no shortness of breath and no vomiting. She has tried nothing for the symptoms. Risk factors include obesity.  Her past medical history is significant for hypertension and PE.  The patient was seen at urgent care prior to arrival and sent to the ED for further evaluation of possible pulmonary embolism.   Past Medical History:  Diagnosis Date  . Bipolar 1 disorder ([place])   . Chronic kidney disease    donated left kidney  . Hypertension   . Pneumonia 2010   had 2 episodes/ was sent home on O2  . Pulmonary embolism ([place]) 1994   Only 1 clot, not on lifelong anticoagulation   Patient Active Problem List   Diagnosis Date Noted  . Undiagnosed cardiac murmurs 07/12/2016  . Wheezing 11/08/2014  . HTN (hypertension) 06/06/2014  . Hair thinning 01/15/2014  . Counseling on health promotion and disease prevention 08/17/2013  . Insomnia 11/13/2012  . Currently attempting to quit smoking 06/16/2011  . Bipolar 1 disorder ([place]) 08/17/2010  . 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 APPENDECTOMY  2001  . Endometriosis s/p left fallopian tube removal.  2001  . 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 TONSILLECTOMY     OB History    No data available     Home Medications    Prior to Admission medications   Medication Sig Start Date End Date Taking? Authorizing Provider  albuterol (PROVENTIL HFA;VENTOLIN HFA) 108 (90 Base) MCG/ACT inhaler Inhale 2 puffs into the lungs every 6 (six) hours as needed for wheezing. 10/24/16   Robyn Haber, MD  buPROPion [facility] SR) 150 MG 12 hr tablet Take 1 tab po daily x 3 days, then 1 tab po BID for smoking cessation 11/25/15   Alveda Reasons, MD  hydrochlorothiazide (MICROZIDE) 12.5 MG capsule TAKE 1 CAPSULE (12.5 MG TOTAL) BY MOUTH DAILY. 05/24/16   Alveda Reasons, MD  lamoTRIgine (LAMICTAL) 200 MG tablet TAKE 1 TABLET BY MOUTH EVERY DAY 06/25/16   Alveda Reasons, MD  nicotine polacrilex (COMMIT) 2 MG lozenge Take 1 lozenge (2 mg total) by mouth as needed for smoking cessation. 11/08/14   Alveda Reasons, MD  traZODone (DESYREL) 100 MG tablet Take 100 mg by mouth at bedtime.    [provider]   Family History Family History  Problem Relation Age of Onset  . Breast cancer Mother   . Breast cancer Maternal Grandmother   . Diabetes Sister   . Kidney disease Sister   . Colon cancer Paternal Grandmother    Social History Social History  Substance Use Topics  . Smoking status: Current Every Day Smoker  Packs/day: 0.50    Years: 10.00    Types: Cigarettes  . Smokeless tobacco: Never Used  . Alcohol use No   Allergies   Iohexol; Morphine and related; and Sulfa antibiotics  Review of Systems Review of Systems  Constitutional: Negative for chills and fever.  HENT: Negative for ear pain and sore throat.   Eyes: Negative for pain and visual disturbance.  Respiratory: Negative for cough and shortness of breath.   Cardiovascular: Positive for chest pain. Negative for palpitations.  Gastrointestinal: Negative for abdominal pain and vomiting.  Genitourinary: Negative for dysuria and  hematuria.  Musculoskeletal: Negative for arthralgias and back pain.  Skin: Negative for color change and rash.  Neurological: Negative for syncope.  All other systems reviewed and are negative.  Physical Exam Updated Vital Signs BP 126/70 (BP Location: Left Arm)   Pulse (!) 116   Temp 98.2 F (36.8 C) (Oral)   Resp 18   Ht 5\' 9"  (1.753 m)   Wt 81.6 kg (180 lb)   SpO2 96%   BMI 26.58 kg/m   Physical Exam  Constitutional: She is oriented to person, place, and time. She appears well-developed and well-nourished. No distress.  HENT:  Head: Normocephalic and atraumatic.  Eyes: Pupils are equal, round, and reactive to light. Conjunctivae and EOM are normal.  Neck: Normal range of motion. Neck supple.  Cardiovascular: Normal rate and regular rhythm.   No murmur heard. Pulmonary/Chest: Effort normal and breath sounds normal. No respiratory distress.  Abdominal: Soft. Bowel sounds are normal. She exhibits no distension. There is no tenderness.  Musculoskeletal: Normal range of motion. She exhibits no edema, tenderness or deformity.  Neurological: She is alert and oriented to person, place, and time.  Skin: Skin is warm and dry.  Psychiatric: She has a normal mood and affect.  Nursing note and vitals reviewed.  ED Treatments / Results  Labs (all labs ordered are listed, but only abnormal results are displayed) Labs Reviewed  BASIC METABOLIC PANEL - Abnormal; Notable for the following:       Result Value   Chloride 100 (*)    Creatinine, Ser 1.03 (*)    All other components within normal limits  CBC - Abnormal; Notable for the following:    WBC 13.3 (*)    All other components within normal limits  I-STAT TROPONIN, ED   EKG  EKG Interpretation  Date/Time:  Saturday November 06 2016 13:36:42 EDT Ventricular Rate:  115 PR Interval:  126 QRS Duration: 86 QT Interval:  336 QTC Calculation: 464 R Axis:   85 Text Interpretation:  Sinus tachycardia Nonspecific T wave  abnormality Abnormal ECG When compared with ECG of 08/13/2008, No significant change was found Confirmed by Delora Fuel ([id number]) on 11/06/2016 2:14:30 PM      Radiology Dg Chest 2 View  Result Date: 11/06/2016 CLINICAL DATA:  Right-sided pain starting last night. Smoker. History of bronchitis, pneumonia, asthma. EXAM: CHEST  2 VIEW COMPARISON:  Chest x-ray dated 10/20/2013. FINDINGS: Heart size and mediastinal contours are stable. New linear opacity at the right lung base. Lungs are hyperexpanded indicating COPD. Coarse interstitial markings suggests some degree of chronic interstitial lung disease. Chronic bronchitic changes noted centrally. Fixation hardware within the thoracic spine appears stable in position. No acute or suspicious osseous finding. IMPRESSION: 1. New linear opacity at the right lung base, most likely atelectasis or chronic scarring/fibrosis, less likely early pneumonia in the absence of fever. 2. Hyperexpanded lungs indicating COPD. Evidence of associated chronic  interstitial lung disease and chronic bronchitic change. Electronically Signed   By: Franki Cabot M.D.   On: 11/06/2016 12:59   Ct Angio Chest Pe W And/or Wo Contrast  Result Date: 11/06/2016 CLINICAL DATA:  51 year old female with or right flank/ right lower rib pain. History of prior PE. EXAM: CT ANGIOGRAPHY CHEST WITH CONTRAST TECHNIQUE: Multidetector CT imaging of the chest was performed using the standard protocol during bolus administration of intravenous contrast. Multiplanar CT image reconstructions and MIPs were obtained to evaluate the vascular anatomy. CONTRAST:  100 cc Isovue three hundred seventy COMPARISON:  100 cc Isovue 370 FINDINGS: Evaluation is limited due to streak artifact caused by spinal hardware. Cardiovascular: There is no cardiomegaly or pericardial effusion. Probable coronary vascular calcification involving the LAD. There is mild atherosclerotic calcification of the thoracic aorta. Noncalcified  plaques noted along the course of the descending thoracic aorta. No aneurysmal dilatation or evidence of dissection. The visualized origins of the great vessels of the aortic arch appear patent. There is no CT evidence of pulmonary embolism. Mediastinum/Nodes: Right hilar adenopathy measures 15 mm in short axis, likely reactive. Clinical correlation is recommended. No mediastinal adenopathy. The esophagus is poorly visualized but appears grossly unremarkable. Lungs/Pleura: There are streaky densities as well as patchy areas of consolidation primarily involving the right lung most consistent with pneumonia. Correlation with clinical exam and follow-up to resolution is recommended. There is diffuse ground-glass density throughout the lungs with areas of lower attenuation indicative of air trapping and may be related to underlying small vessel versus small airway disease. There is no pleural effusion or pneumothorax. The central airways are patent. Upper Abdomen: No acute abnormality. Musculoskeletal: There is scoliosis with degenerative changes of the spine. Posterior thoracic Harrington broad noted. No acute fracture. Review of the MIP images confirms the above findings. IMPRESSION: 1. No CT evidence of pulmonary embolism. 2. Areas of airspace disease primarily involving the right lung most consistent with pneumonia. Clinical correlation and follow-up resolution recommended. 3. Right hilar adenopathy, likely reactive. Electronically Signed   By: Anner Crete M.D.   On: 11/06/2016 23:03   Procedures Procedures (including critical care time)  Medications Ordered in ED Medications - No data to display  Initial Impression / Assessment and Plan / ED Course  I have reviewed the triage vital signs and the nursing notes.  Pertinent labs & imaging results that were available during my care of the patient were reviewed by me and considered in my medical decision making (see chart for details).  Upon my  evaluation patient patient endorses atypical chest pain today. She was sent to the ED for concerns for possible PE.   EKG I obtained reveals no anatomical ischemia representing STEMI, New-onset Arrhythmia, or ischemic equivalent. Therefore do not suspect ACS at this time. No concerns for Pericardial Tamponade on EKG and in light of patients hemodynamic stability. No pain related to supine or prone positions and given EKG doubt Pericarditis.   Troponin negative in the ED.  Without evidence of Pneumothorax. CXR without concern for Esophageal Tear and there is no recent intractable emesis or esophageal instrumentation.  No peritonitis or free air on CXR worrisome for Perforated Abdominal Viscous.  Patient with moderately high risk for PE, presented with tachycardia, tachypnea, and PMH of previous PE at this time consider CTA PE study reasonable study.  Due to reported contrast allergy will premedicate patient with benadryl and steroids.    Pain is not described as tearing and does not radiate to back,  doubt Aortic Dissection. Pulses present bilaterally in upper and lower extremities. CXR does not show widened mediastinum.  CTA  Revealed: No CT evidence of pulmonary embolism. Areas of airspace disease primarily involving the right lung most consistent with pneumonia.  Patient has been 1 dose of Levaquin in the emergency department for pneumonia.  Patient endorsed recent Z-Pak about 2 weeks ago for sinus infection therefore will discharge with prescription for Levaquin.  My H&P and patient's clinical course is not currently consistent with any of the above emergency medical conditions at this time.  At this time considered patient safe and stable for discharge.  Patient given strict return precautions for any new or worsening symptoms including worsening chest pain or worsening or associated shortness of breath.  Advised to follow-up with her primary care physician in 3-5 days as needed for further  evaluation of symptoms.  Patient is in agreement with the plan at this time.  Feels safe comfortable going home.  Final Clinical Impressions(s) / ED Diagnoses   Final diagnoses:  Chest pain, unspecified type  Community acquired pneumonia of right lung, unspecified part of lung   New Prescriptions Discharge Medication List as of 11/06/2016 11:32 PM    START taking these medications   Details  levofloxacin (LEVAQUIN) 750 MG tablet Take 1 tablet (750 mg total) by mouth daily., Starting Sat 11/06/2016, Print         Fenton Foy, MD 11/07/16 1740    Elnora Morrison, MD 11/07/16 2328

## 2016-11-06 NOTE — ED Triage Notes (Signed)
Pt c/o constant right flank pain onset last night that radiates underneath right breast  Pain increases when pt is lying down  Denies inj/trauma, n/v/d, abd pain, fevers  A&O x4... NAD... Ambulatory

## 2016-11-23 ENCOUNTER — Ambulatory Visit (HOSPITAL_COMMUNITY)
Admission: RE | Admit: 2016-11-23 | Discharge: 2016-11-23 | Disposition: A | Discharge status: Home / Self Care | Payer: Medicare Other | Source: Ambulatory Visit | Attending: Family Medicine | Admitting: Family Medicine

## 2016-11-23 ENCOUNTER — Ambulatory Visit (INDEPENDENT_AMBULATORY_CARE_PROVIDER_SITE_OTHER): Payer: Medicare Other | Admitting: Internal Medicine

## 2016-11-23 ENCOUNTER — Encounter: Payer: Self-pay | Admitting: Internal Medicine

## 2016-11-23 ENCOUNTER — Other Ambulatory Visit: Payer: Self-pay

## 2016-11-23 VITALS — BP 122/70 | HR 116 | Temp 98.7°F | Wt 177.0 lb

## 2016-11-23 DIAGNOSIS — J449 Chronic obstructive pulmonary disease, unspecified: Secondary | ICD-10-CM | POA: Diagnosis not present

## 2016-11-23 DIAGNOSIS — R918 Other nonspecific abnormal finding of lung field: Secondary | ICD-10-CM | POA: Diagnosis not present

## 2016-11-23 DIAGNOSIS — R109 Unspecified abdominal pain: Secondary | ICD-10-CM | POA: Insufficient documentation

## 2016-11-23 DIAGNOSIS — I700 Atherosclerosis of aorta: Secondary | ICD-10-CM | POA: Diagnosis not present

## 2016-11-23 DIAGNOSIS — R3129 Other microscopic hematuria: Secondary | ICD-10-CM

## 2016-11-23 DIAGNOSIS — J90 Pleural effusion, not elsewhere classified: Secondary | ICD-10-CM | POA: Diagnosis not present

## 2016-11-23 LAB — POCT UA - MICROSCOPIC ONLY

## 2016-11-23 LAB — POCT URINALYSIS DIP (MANUAL ENTRY)
Bilirubin, UA: NEGATIVE
Glucose, UA: NEGATIVE mg/dL
Ketones, POC UA: NEGATIVE mg/dL
Leukocytes, UA: NEGATIVE
Nitrite, UA: NEGATIVE
SPEC GRAV UA: 1.015 (ref 1.010–1.025)
Urobilinogen, UA: 0.2 E.U./dL
pH, UA: 7 (ref 5.0–8.0)

## 2016-11-23 MED ORDER — TRAMADOL HCL 50 MG PO TABS: 50.0000 mg | ORAL_TABLET | Freq: Three times a day (TID) | ORAL | 0 refills | Status: DC | PRN

## 2016-11-23 NOTE — Progress Notes (Signed)
[facility] Phone: [phone]   Date of Visit: 11/23/2016   HPI:  Right flank pain: - Patient was initially seen in the ED on 10/27 for right flank pain.  At that time she had a EKG completed, troponin which was negative.  CBC showed mild leukocytosis to 13.3.  She had a CTA chest done which showed areas of airspace disease involving the right lung that was most consistent with pneumonia.  She was treated with Levaquin.  Additionally a UA was done which showed small hemoglobin but no microscopy.  - She reports that she felt a little better after the course of antibiotics but not back to her baseline.  For the past few days her pain on her right flank returned.  She reports that this was similar to her symptoms that she had prior to being diagnosed with pneumonia last month.  Last month her pain started on her right flank and then slowly moved to her right chest right below her breast. - She reports that this pain is constant and does not radiate.  The pain does worsen with any kind of movement or with deep inspiration.  No changes in her activity.  No injury or trauma to the area.  Symptoms do not worsen after a meal.  She has used a heating pad on the area which has not helped. - She denies any shortness of breath, cough, fevers, chills.  Denies any abdominal pain.  Denies any chest pain or palpitations.  She is unsure if she has her gallbladder.  Per chart review no note of cholecystectomy. Denies any sick contacts.  No personal history of cancer.  She does smoke tobacco.  No unintentional weight loss and no night sweats. - No urinary symptoms such as dysuria or worsened urinary frequency.  No blood in her urine.  No history of nephrolithiasis. -She has tried ibuprofen and Tylenol for her symptoms without any improvement. - no nausea, vomiting. Normal PO intake.   ROS: See HPI.  [place]:  PMH: HTN Bipolar 1 DO  PHYSICAL EXAM: BP 122/70   Pulse (!) 116   Temp  98.7 F (37.1 C) (Oral)   Wt 177 lb (80.3 kg)   SpO2 96%   BMI 26.14 kg/m  GEN: NAD, nontoxic appearing, pleasant  CV: RRR, systolic murmur PULM: CTAB, normal effort, no crackles or wheezing noted  ABD: Soft, nontender, nondistended, NABS, no organomegaly, negative murphy sign. CVA tenderness on the right.  SKIN: No rash noted on the area of concern (flank or under right breast) or cyanosis; warm and well-perfused PSYCH: Mood and affect euthymic, normal rate and volume of speech NEURO: Awake, alert, no focal deficits grossly, normal speech  ASSESSMENT/PLAN:  Right flank pain: Etiology is currently uncertain.  Patient is afebrile and does not have a cough.  She does not have a cough or fever at the time that she was diagnosed with a pneumonia in the ED.  I feel like her current symptoms are less likely due to a pneumonia.  She was adequately treated for pneumonia last month.  I will obtain a chest x-ray today for further evaluation.  We will also get a CBC.  Another possible cause is nephrolithiasis.  Her UA today showed microscopic hematuria but no signs of infection.  Therefore we will proceed with a CT renal stone study.  Unlikely this is gallstone related.  Provided tramadol as needed for pain.  If CT renal stone study is negative we may need  to repeat a CT chest to evaluate her lungs again.  She does have a history of smoking but the prior CT chest did not show any kind of mass/neoplasm.   Smiley Houseman, MD PGY [place]

## 2016-11-23 NOTE — Patient Instructions (Signed)
Take extra strength Tylenol for your pain. I have also prescribed Tramadol to take as needed.   We will get a chest x-ray  And blood work.

## 2016-11-24 ENCOUNTER — Telehealth: Payer: Self-pay | Admitting: Family Medicine

## 2016-11-24 ENCOUNTER — Other Ambulatory Visit: Payer: Self-pay | Admitting: Internal Medicine

## 2016-11-24 ENCOUNTER — Telehealth: Payer: Self-pay

## 2016-11-24 DIAGNOSIS — R918 Other nonspecific abnormal finding of lung field: Secondary | ICD-10-CM

## 2016-11-24 DIAGNOSIS — R079 Chest pain, unspecified: Secondary | ICD-10-CM

## 2016-11-24 DIAGNOSIS — R10A0 Flank pain, unspecified side: Secondary | ICD-10-CM

## 2016-11-24 DIAGNOSIS — R109 Unspecified abdominal pain: Secondary | ICD-10-CM

## 2016-11-24 DIAGNOSIS — J90 Pleural effusion, not elsewhere classified: Secondary | ICD-10-CM

## 2016-11-24 DIAGNOSIS — R011 Cardiac murmur, unspecified: Secondary | ICD-10-CM

## 2016-11-24 DIAGNOSIS — I10 Essential (primary) hypertension: Secondary | ICD-10-CM

## 2016-11-24 LAB — CBC
Hematocrit: 42.1 % (ref 34.0–46.6)
Hemoglobin: 13.7 g/dL (ref 11.1–15.9)
MCH: 26.7 pg (ref 26.6–33.0)
MCHC: 32.5 g/dL (ref 31.5–35.7)
MCV: 82 fL (ref 79–97)
PLATELETS: 331 10*3/uL (ref 150–379)
RBC: 5.13 x10E6/uL (ref 3.77–5.28)
RDW: 14.5 % (ref 12.3–15.4)
WBC: 11.1 10*3/uL — AB (ref 3.4–10.8)

## 2016-11-24 NOTE — Telephone Encounter (Signed)
Discussed results and history with Dr. Gwendlyn Deutscher. Her history and findings are not consistent with pneumonia (additionally she received adequate treatment for PNA by the ED on 10/27. The new chest x-ray revealed persisted R basilar linear and patchy opacities consistent with scarring/atelectasis/persistent infiltrate. There are also noted to be a small pleural effusion. Her cbc was barely elevated at 11.1. CT renal stone study was negative for nephrolithiasis. We decided to obtain PFTs, ECHO, and TSH for further evaluation. Additionally will do trial of low dose Ibuprofen 200mg  TID x 5 days (in the setting of one kidney). Will also touch base with pulmonology for assistance. As mentioned in the office visit, her oxygen saturation was normal and she had normal work of breathing.   Discussed the above with the patient. I asked her to make lab visit for TSH. Ordered this. I will also order ECHO and have nursing schedule this with patient. Also, will ask nursing to schedule PFT with Dr. Valentina Lucks. (separate order entry).

## 2016-11-24 NOTE — Telephone Encounter (Signed)
Smiley Houseman, MD routed conversation to [facility] 1 hour ago (10:39 AM)    Smiley Houseman, MD 1 hour ago (10:35 AM)     Please call patient to inform her of the following.  I was able to order a future TSH. Ask her to make a lab visit for this I ordered ECHO. Please make an appointment for her for this Please help patient make an appointment with Dr. Valentina Lucks for PFT.       Pt schueduled for ECHO, 11/27 @8am  at [facility]. Pt scheduled with [facility], 11/29 @830am . Pt has been contacted and notified of this. Pt was reminded of her lab apt this week.

## 2016-11-24 NOTE — Telephone Encounter (Signed)
Routing to doctor that saw pt and ordered the scan. Katharina Caper, Dakotah Orrego D, [place]

## 2016-11-24 NOTE — Progress Notes (Signed)
Please call patient to inform [name] of the following.  I was able to order a future TSH. Ask [name] to make a lab visit for this I ordered ECHO. Please make an appointment for [name] for this Please help patient make an appointment with Dr. Valentina Lucks for PFT.

## 2016-11-24 NOTE — Telephone Encounter (Signed)
Pt called wanting to know her results from her scan yesterday. Please advise

## 2016-11-26 ENCOUNTER — Other Ambulatory Visit (INDEPENDENT_AMBULATORY_CARE_PROVIDER_SITE_OTHER): Payer: Medicare Other

## 2016-11-26 DIAGNOSIS — I10 Essential (primary) hypertension: Secondary | ICD-10-CM

## 2016-11-26 DIAGNOSIS — Z23 Encounter for immunization: Secondary | ICD-10-CM

## 2016-11-27 LAB — TSH: TSH: 1.09 u[IU]/mL (ref 0.450–4.500)

## 2016-11-29 ENCOUNTER — Encounter: Payer: Self-pay | Admitting: Internal Medicine

## 2016-11-29 ENCOUNTER — Ambulatory Visit (HOSPITAL_COMMUNITY)
Admission: RE | Admit: 2016-11-29 | Discharge: 2016-11-29 | Disposition: A | Discharge status: Home / Self Care | Payer: Medicare Other | Source: Ambulatory Visit | Attending: Family Medicine | Admitting: Family Medicine

## 2016-11-29 ENCOUNTER — Encounter: Payer: Self-pay | Admitting: Family Medicine

## 2016-11-29 ENCOUNTER — Other Ambulatory Visit: Payer: Self-pay

## 2016-11-29 ENCOUNTER — Ambulatory Visit (INDEPENDENT_AMBULATORY_CARE_PROVIDER_SITE_OTHER): Payer: Medicare Other | Admitting: Family Medicine

## 2016-11-29 VITALS — BP 136/78 | HR 110 | Temp 98.0°F | Ht 69.0 in | Wt 177.0 lb

## 2016-11-29 DIAGNOSIS — R000 Tachycardia, unspecified: Secondary | ICD-10-CM | POA: Diagnosis not present

## 2016-11-29 DIAGNOSIS — J181 Lobar pneumonia, unspecified organism: Secondary | ICD-10-CM

## 2016-11-29 DIAGNOSIS — R079 Chest pain, unspecified: Secondary | ICD-10-CM

## 2016-11-29 DIAGNOSIS — R0781 Pleurodynia: Secondary | ICD-10-CM

## 2016-11-29 MED ORDER — LIDOCAINE 5 % EX PTCH: 1.0000 | MEDICATED_PATCH | CUTANEOUS | 0 refills | Status: DC

## 2016-11-29 NOTE — Patient Instructions (Signed)
It was good to see you again today.  We are checking some labs today like I discussed with the.  I will call with these results in the next day or so.  Use lidocaine patch on the spot under your ribs to help with pain relief.  You can also try cutting the tramadol and a half to see if this makes you less sleepy.  Have a great Thanksgiving and I will talk with you soon.

## 2016-11-29 NOTE — Progress Notes (Signed)
Subjective:    [name] is a 51 y.o. female who presents to [facility] today for Right rib pain:  1.  Right rib pain:  Present now for about 3 weeks.  Has had ongoing evaluations for the same, including multiple urgent care and ED visits as well as visits here in clinic.  Has been diagnosed with PNA without any evidence of cough or fever.  Had CT angio chest when first started without any evidence of PE.  She has not been able to take NSAIDs due to having only 1 kidney after nephrectomy when she donated a kidney to her sister.  Still with ongoing right rib pain.  Present on right side.  Tender palpation.  She is not currently taking anything for pain relief.  She is wearing a heat pack under her bra with some minimal relief.  No rash.  No fevers or chills.  No dyspnea.  She traveled to [place] in July of this year.  She has not had any lower extremity edema or pain since then.  She has been ambulating well.  She has had some increased stress at home as her son was recently admitted to the behavioral health unit after inhalation of synthetic cannabinoids.    No falls or trauma to the area.  ROS as above per HPI.  Pertinently, palpitations, SOB, Fever, Chills, Abd pain, N/V/D.   The following portions of the patient's history were reviewed and updated as appropriate: allergies, current medications, past medical history, family and social history, and problem list. Patient is a nonsmoker.    PMH reviewed.  Past Medical History:  Diagnosis Date  . Bipolar 1 disorder ([place])   . Chronic kidney disease    donated left kidney  . Hypertension   . Pneumonia 2010   had 2 episodes/ was sent home on O2  . Pulmonary embolism ([place]) 1994   Only 1 clot, not on lifelong anticoagulation   Past Surgical History:  Procedure Laterality Date  . APPENDECTOMY  2001  . Endometriosis s/p left fallopian tube removal.  2001  . 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 TONSILLECTOMY       Medications reviewed. Current Outpatient Medications  Medication Sig Dispense Refill  . albuterol (PROVENTIL HFA;VENTOLIN HFA) 108 (90 Base) MCG/ACT inhaler Inhale 2 puffs into the lungs every 6 (six) hours as needed for wheezing. 1 Inhaler 5  . buPROPion (WELLBUTRIN SR) 150 MG 12 hr tablet Take 1 tab po daily x 3 days, then 1 tab po BID for smoking cessation (Patient not taking: Reported on 11/06/2016) 60 tablet 1  . guaiFENesin (MUCINEX) 600 MG 12 hr tablet Take 600 mg by mouth 2 (two) times daily as needed.    . hydrochlorothiazide (MICROZIDE) 12.5 MG capsule TAKE 1 CAPSULE (12.5 MG TOTAL) BY MOUTH DAILY. 90 capsule 2  . ibuprofen (ADVIL,MOTRIN) 200 MG tablet Take 200 mg by mouth every 6 (six) hours as needed for moderate pain.    Marland Kitchen lamoTRIgine (LAMICTAL) 200 MG tablet TAKE 1 TABLET BY MOUTH EVERY DAY (Patient taking differently: take 200mg  by mouth every evening) 30 tablet 6  . levofloxacin (LEVAQUIN) 750 MG tablet Take 1 tablet (750 mg total) by mouth daily. 4 tablet 0  . nicotine polacrilex (COMMIT) 2 MG lozenge Take 1 lozenge (2 mg total) by mouth as needed for smoking cessation. (Patient not taking: Reported on 11/06/2016) 100 tablet 3  . traMADol (ULTRAM) 50 MG tablet Take 1 tablet (  50 mg total) every 8 (eight) hours as needed by mouth. 15 tablet 0  . traZODone (DESYREL) 100 MG tablet Take 100 mg by mouth at bedtime.  5   Current Facility-Administered Medications  Medication Dose Route Frequency Provider Last Rate Last Dose  . 0.9 %  sodium chloride infusion  500 mL Intravenous Continuous Nandigam, Venia Minks, MD         Objective:   Physical Exam BP (!) 148/80 (BP Location: Right Arm, Patient Position: Sitting, Cuff Size: Normal)   Pulse (!) 110   Temp 98 F (36.7 C) (Oral)   Ht 5\' 9"  (1.753 m)   Wt 177 lb (80.3 kg)   SpO2 98%   BMI 26.14 kg/m  Gen:  Alert, cooperative patient who appears stated age in no acute distress.  Vital signs reviewed. HEENT: EOMI,   MMM Cardiac:  Regular rate and rhythm without murmur auscultated.  Good S1/S2. Pulm:  Clear to auscultation bilaterally with good air movement.  No wheezes or rales noted.   Chest: Exam performed with nurse chaperone in room: General palpation along fifth rib directly under her right breast along the flank.  Does not extend to the anterior portion of her chest.  She has no visible rash or lesions here. Abd:  Soft/nondistended/nontender.  Good bowel sounds throughout all four quadrants.  No masses noted.  Exts: Non edematous BL  LE, warm and well perfused.

## 2016-11-30 ENCOUNTER — Telehealth: Payer: Self-pay | Admitting: *Deleted

## 2016-11-30 DIAGNOSIS — R079 Chest pain, unspecified: Secondary | ICD-10-CM | POA: Insufficient documentation

## 2016-11-30 DIAGNOSIS — R918 Other nonspecific abnormal finding of lung field: Secondary | ICD-10-CM

## 2016-11-30 LAB — RHEUMATOID FACTOR: Rhuematoid fact SerPl-aCnc: 11.9 IU/mL (ref 0.0–13.9)

## 2016-11-30 LAB — ANA: ANA: NEGATIVE

## 2016-11-30 LAB — C-REACTIVE PROTEIN: CRP: 8.1 mg/L — ABNORMAL HIGH (ref 0.0–4.9)

## 2016-11-30 LAB — SEDIMENTATION RATE: SED RATE: 20 mm/h (ref 0–40)

## 2016-11-30 LAB — ANGIOTENSIN CONVERTING ENZYME: Angio Convert Enzyme: 89 U/L — ABNORMAL HIGH (ref 14–82)

## 2016-11-30 NOTE — Assessment & Plan Note (Signed)
Ongoing right sided chest plain along the midclavicular line. -No evidence for shingles or other rash/lesions. -Feels mostly like a rib fracture as the pain is directly my fingers at palpation and not deeper inside of her chest. -I have personally reviewed her CT scan and the images plus her prior EKGs. -She did have S1Q3T3 on her first EKG which would be concerning for pulmonary embolism.  However her CTA was negative.  I repeated her EKG which showed resolution of this finding though she does still have continued flattened T waves in lead III. -Right now we are going to treat this is musculoskeletal pain with tramadol plus lidocaine patches.  She was getting very sleepy with the tramadol but she is taking this at night at the same time she is taking her tramadol and Lamictal.  She should try cutting this in half and taking it during the day to see if this helps with drowsiness. -Follow-up in 2 weeks. -She has had ongoing concern for scarring but no active evidence of pneumonia which may be concerning for sarcoidosis.  We are checking some connective tissue disease labs today.

## 2016-11-30 NOTE — Telephone Encounter (Signed)
Received fax from [place] requesting prior authorization of Lidocaine 5% patch .  PA sent to [facility] via cover my meds. Will await response from [facility].Clinton Sawyer, Salome Spotted, [place]

## 2016-11-30 NOTE — Telephone Encounter (Signed)
-----   Message from Smiley Houseman, MD sent at 11/30/2016  5:41 AM EST ----- Please inform patient that her thyroid test is normal. Also mention to her that I am still in the process of getting in touch with the lung doctor.

## 2016-11-30 NOTE — Telephone Encounter (Signed)
Will forward to Dr. Dallas Schimke to update her. Zanae Kuehnle,CMA

## 2016-11-30 NOTE — Telephone Encounter (Signed)
Patient informed of labs and also lung doctor status.  She states that she saw her PCP yesterday. Evora Schechter,CMA

## 2016-12-01 ENCOUNTER — Other Ambulatory Visit: Payer: Self-pay | Admitting: Family Medicine

## 2016-12-01 MED ORDER — HYDROCODONE-ACETAMINOPHEN 5-325 MG PO TABS: 1.0000 | ORAL_TABLET | Freq: Four times a day (QID) | ORAL | 0 refills | Status: DC | PRN

## 2016-12-01 NOTE — Telephone Encounter (Signed)
Received Denial from [facility] thru covermymeds.  Added notes from visit on 11/29/16 as the appeal. Will await decision. [place], [name], CMA

## 2016-12-01 NOTE — Telephone Encounter (Signed)
Called and spoke with patient.  Still with some residual pain.  Discussed elevated CRP and ACE level.  Concern for possible sarcoidosis with this plus what appears to be lung scarring on imaging.  Plan is referral to pulmonology, which is already in.  In the meantime, insurance isn't paying for her lidocaine patches.  We are attempting a prior authorization.  Until this goes through, I will print a brief prescription for Norco for her for severe pain.  Tramadol for moderate pain.  She agreed with plan and will wait to hear from pulmonology.  She has morphine listed under her allergies as producing hives, but tells me she has taken both norco and percocet without trouble in the past.    She will come by to pick up her script this PM.  I will leave it up front.

## 2016-12-01 NOTE — Addendum Note (Signed)
Addended by: Smiley Houseman on: 12/01/2016 09:42 AM   Modules accepted: Orders

## 2016-12-01 NOTE — Telephone Encounter (Signed)
Called patient to discuss pulmonology recommendations that I received yesterday. Below is a copy of the communication. Patient is okay with going ahead and making referral to pulmonology.  Will forward to PCP.   Chesley Mires, MD  Smiley Houseman, MD        I reviewed her CT chest and f/u CXR. The CXR findings could represent scarring after episode of pneumonia in October. She should have additional follow up CXR in December.   She also had ground glass opacification on CT chest which seemed persistent on CT abdomen in November. Her previous spirometry was suggestive of an obstructive and restrictive defect. She is listed as a smoker. She probably would benefit from referral to pulmonary medicine to further assess these aspects.   Let me know if I can be of further assistance.   Previous Messages    ----- Message -----  From: Smiley Houseman, MD  Sent: 11/24/2016 11:28 AM  To: Chesley Mires, MD  Subject: outpatient curbside consult            Dear Dr. Halford Chessman,   I am a family medicine resident at [facility] and wanted to see what your thoughts were on this patient.   She is a 50yo female with PMH of HTN, Tobacco Use, murmur and a kidney donor.   She presented to the ED with right flank and chest pain. CTA chest showed this odd air space opacities on the right base which was read as PNA. Wbc was 13.3. She never had a fever, cough, or shortness of breath, or O2 desaturation. She was treated with Levaquin x 5 days.  She presented to clinic yesterday to clinic. She said that antibiotics initially helped improved symptom some, but for the past few days her pain worsened. Still no fevers, cough, or shortness of breath. Repeat CXR showed persistent right basilar linear and patchy opacity similar to prior with a small R pleural effusion. WBC 11.1. I precepted with my attending who recommended talking to pulmonology. Would you be able to review the imaging?   Please let me know if  you would like to speak over the phone. My pager is [phone]   [place]

## 2016-12-06 NOTE — Telephone Encounter (Signed)
[employer] has denied appeal for lidocaine.  Will forward to MD. Ewen Varnell, Salome Spotted, [place]

## 2016-12-07 ENCOUNTER — Ambulatory Visit (HOSPITAL_COMMUNITY)
Admission: RE | Admit: 2016-12-07 | Discharge: 2016-12-07 | Disposition: A | Discharge status: Home / Self Care | Payer: Medicare Other | Source: Ambulatory Visit | Attending: Family Medicine | Admitting: Family Medicine

## 2016-12-07 DIAGNOSIS — R011 Cardiac murmur, unspecified: Secondary | ICD-10-CM | POA: Insufficient documentation

## 2016-12-07 DIAGNOSIS — R079 Chest pain, unspecified: Secondary | ICD-10-CM

## 2016-12-07 NOTE — Progress Notes (Signed)
  Echocardiogram 2D Echocardiogram has been performed.  [name] 12/07/2016, 8:53 AM

## 2016-12-08 ENCOUNTER — Telehealth: Payer: Self-pay | Admitting: Internal Medicine

## 2016-12-08 NOTE — Telephone Encounter (Signed)
Called patient to inform of the ECHO results which are unremarkable. I also informed her that she does not need to keep appointment with Dr. Valentina Lucks for PFT since we are referring her to pulmonology.

## 2016-12-09 ENCOUNTER — Ambulatory Visit: Payer: Medicare Other | Admitting: Pharmacist

## 2016-12-20 ENCOUNTER — Institutional Professional Consult (permissible substitution): Payer: Medicare Other | Admitting: Pulmonary Disease

## 2016-12-22 ENCOUNTER — Encounter: Payer: Self-pay | Admitting: Pulmonary Disease

## 2016-12-22 ENCOUNTER — Ambulatory Visit (INDEPENDENT_AMBULATORY_CARE_PROVIDER_SITE_OTHER)
Admission: RE | Admit: 2016-12-22 | Discharge: 2016-12-22 | Disposition: A | Discharge status: Home / Self Care | Payer: Medicare Other | Source: Ambulatory Visit | Attending: Pulmonary Disease | Admitting: Pulmonary Disease

## 2016-12-22 ENCOUNTER — Ambulatory Visit (INDEPENDENT_AMBULATORY_CARE_PROVIDER_SITE_OTHER): Payer: Medicare Other | Admitting: Pulmonary Disease

## 2016-12-22 VITALS — BP 156/88 | HR 131 | Ht 69.0 in | Wt 183.8 lb

## 2016-12-22 DIAGNOSIS — R091 Pleurisy: Secondary | ICD-10-CM | POA: Diagnosis not present

## 2016-12-22 DIAGNOSIS — Z716 Tobacco abuse counseling: Secondary | ICD-10-CM | POA: Diagnosis not present

## 2016-12-22 DIAGNOSIS — Z8701 Personal history of pneumonia (recurrent): Secondary | ICD-10-CM

## 2016-12-22 DIAGNOSIS — J9811 Atelectasis: Secondary | ICD-10-CM | POA: Diagnosis not present

## 2016-12-22 MED ORDER — PREDNISONE 10 MG PO TABS: ORAL_TABLET | ORAL | 0 refills | Status: DC

## 2016-12-22 NOTE — Patient Instructions (Signed)
Chest xray today  Prednisone 10 mg pill >> 2 pills daily for 1 week, then 1 pill daily for 1 week  Follow up in 4 weeks

## 2016-12-22 NOTE — Progress Notes (Signed)
   Subjective:    Patient ID: [name], female    DOB: [birth year], 51 y.o.   MRN: [id number]  HPI    Review of Systems  Constitutional: Negative for fever and unexpected weight change.  HENT: Negative for congestion, dental problem, ear pain, nosebleeds, postnasal drip, rhinorrhea, sinus pressure, sneezing, sore throat and trouble swallowing.   Eyes: Negative for redness and itching.  Respiratory: Positive for cough. Negative for chest tightness, shortness of breath and wheezing.   Cardiovascular: Negative for palpitations and leg swelling.  Gastrointestinal: Negative for nausea and vomiting.  Genitourinary: Negative for dysuria.  Musculoskeletal: Negative for joint swelling.  Skin: Negative for rash.  Allergic/Immunologic: Negative.  Negative for environmental allergies, food allergies and immunocompromised state.  Neurological: Negative for headaches.  Hematological: Does not bruise/bleed easily.  Psychiatric/Behavioral: Negative for dysphoric mood. The patient is not nervous/anxious.        Objective:   Physical Exam        Assessment & Plan:

## 2016-12-22 NOTE — Progress Notes (Signed)
[facility]  Chief Complaint  Patient presents with  . Follow-up    Pt here for abnormal CT scan results.Pt referred by Dr. Annabell Sabal MD. Pt having pain on right mid back and moved nder right rib pain for around one month.     History of Present Illness: [name] is a female 51 y.o. smoker with abnormal CT chest.  She had an upper respiratory infection in October.  She was treated with antibiotics.  She started to get right side chest pain.  This would hurt when she would take a breath.  She had a CT chest that showed right lower lung consolidation, ground glass, and air trapping.  She is not having fever, hemoptysis, skin rash, joint swelling, or GI symptoms.  She has chronic cough with clear sputum.  She continues to smoke cigarettes.  She was told chantix wouldn't be an option due to history of bipolar.  She tried nicotine patch, but got skin irritation.  She has used nicotine gum.    She has lived in [place] for years.  She is not working presently.  No occupational exposures.  No animal exposures.  Had pneumonia in 2000 and 2010.  No history of tuberculosis.  She donated a kidney to her sister in 49's and had a pulmonary embolism at that time.    She has an albuterol inhaler, but doesn't need to use it.  She can walk up 3 flights of stairs without difficulty.  Past Medical History: She  has a past medical history of Bipolar 1 disorder ([place]), Chronic kidney disease, Hypertension, Pneumonia (2010), and Pulmonary embolism ([place]) (1994).  Vital Signs: BP (!) 156/88 (BP Location: Left Arm, Cuff Size: Normal)   Pulse (!) 131   Ht 5\' 9"  (1.753 m)   Wt 183 lb 12.8 oz (83.4 kg)   SpO2 96%   BMI 27.14 kg/m   Physical Exam:  General - pleasant Eyes - pupils reactive ENT - no sinus tenderness, no oral exudate, no LAN Cardiac - regular, no murmur Chest - no wheeze, rales Abd - soft, non tender Ext - no edema Skin - no  rashes Neuro - normal strength Psych - normal mood  CMP Latest Ref Rng & Units 11/06/2016 07/12/2016 11/25/2015  Glucose 65 - 99 mg/dL 99 93 87  BUN 6 - 20 mg/dL 18 17 22   Creatinine 0.44 - 1.00 mg/dL 1.03(H) 0.96 1.01  Sodium 135 - 145 mmol/L 137 144 139  Potassium 3.5 - 5.1 mmol/L 3.8 4.0 3.8  Chloride 101 - 111 mmol/L 100(L) 102 102  CO2 22 - 32 mmol/L 28 25 28   Calcium 8.9 - 10.3 mg/dL 9.2 9.5 9.5  Total Protein 6.0 - 8.5 g/dL - 6.9 7.0  Total Bilirubin 0.0 - 1.2 mg/dL - <0.2 0.4  Alkaline Phos 39 - 117 IU/L - 71 60  AST 0 - 40 IU/L - 15 17  ALT 0 - 32 IU/L - 13 10    CBC Latest Ref Rng & Units 11/23/2016 11/06/2016 07/12/2016  WBC 3.4 - 10.8 x10E3/uL 11.1(H) 13.3(H) 10.6  Hemoglobin 11.1 - 15.9 g/dL 13.7 13.8 14.0  Hematocrit 34.0 - 46.6 % 42.1 41.6 42.9  Platelets 150 - 379 x10E3/uL 331 385 305    Discussion: 51 yo female smoker with right pleuritic chest pain.  Her recent radiographic images are consistent with pneumonia.  She likely had a viral infection leading to a bacterial superinfection.  She still has some  right sided chest discomfort, but is improving.  Assessment/Plan:  Pleuritis. - will give course of prednisone  Recent episode of pneumonia. - chest xray today  Tobacco abuse. - reviewed different options for smoking cessation - she will try nicotine patch - she will d/w her PCP about whether she can use bupropion in setting of her bipolar disease   Patient Instructions  Chest xray today  Prednisone 10 mg pill >> 2 pills daily for 1 week, then 1 pill daily for 1 week  Follow up in 4 weeks    Chesley Mires, MD [place] 12/22/2016, 12:28 PM Pager:  [phone]    Pulmonary tests: CT angio chest 11/06/16 >> consolidation Rt lung, diffuse GGO b/l, air trapping Serology 11/29/16 >> ANA, ACE 89, RF 11.9  Sleep tests:  Cardiac tests: Echo 12/07/16 >> EF 60%, grade 1DD  Events:  Review of Systems: Constitutional:  Negative for fever and unexpected weight change.  HENT: Negative for congestion, dental problem, ear pain, nosebleeds, postnasal drip, rhinorrhea, sinus pressure, sneezing, sore throat and trouble swallowing.   Eyes: Negative for redness and itching.  Respiratory: Positive for cough. Negative for chest tightness, shortness of breath and wheezing.   Cardiovascular: Negative for palpitations and leg swelling.  Gastrointestinal: Negative for nausea and vomiting.  Genitourinary: Negative for dysuria.  Musculoskeletal: Negative for joint swelling.  Skin: Negative for rash.  Allergic/Immunologic: Negative.  Negative for environmental allergies, food allergies and immunocompromised state.  Neurological: Negative for headaches.  Hematological: Does not bruise/bleed easily.  Psychiatric/Behavioral: Negative for dysphoric mood. The patient is not nervous/anxious.    Past Surgical History: She  has a past surgical history that includes Endometriosis s/p left fallopian tube removal. (2001); Appendectomy (2001); Nephrectomy living donor (1993); and Tonsillectomy.  Family History: Her family history includes Breast cancer in her maternal grandmother and mother; Colon cancer in her paternal grandmother; Diabetes in her sister; Kidney disease in her sister.  Social History: She  reports that she has been smoking cigarettes.  She has a 10.00 pack-year smoking history. she has never used smokeless tobacco. She reports that she does not drink alcohol or use drugs.  Medications: Allergies as of 12/22/2016      Reactions   Iohexol Hives   Hives 06/06/04, needs pre meds   Morphine And Related Hives, Itching   Denies Airway involvement   Sulfa Antibiotics Itching, Rash   Reported with TMP/ SMX  Denies Airway involvement      Medication List        Accurate as of 12/22/16 12:28 PM. Always use your most recent med list.          albuterol 108 (90 Base) MCG/ACT inhaler Commonly known as:  PROVENTIL  HFA;VENTOLIN HFA Inhale 2 puffs into the lungs every 6 (six) hours as needed for wheezing.   guaiFENesin 600 MG 12 hr tablet Commonly known as:  MUCINEX Take 600 mg by mouth 2 (two) times daily as needed.   hydrochlorothiazide 12.5 MG capsule Commonly known as:  MICROZIDE TAKE 1 CAPSULE (12.5 MG TOTAL) BY MOUTH DAILY.   HYDROcodone-acetaminophen 5-325 MG tablet Commonly known as:  NORCO Take 1 tablet by mouth every 6 (six) hours as needed for severe pain.   lamoTRIgine 200 MG tablet Commonly known as:  LAMICTAL TAKE 1 TABLET BY MOUTH EVERY DAY   lidocaine 5 % Commonly known as:  LIDODERM Place 1 patch daily onto the skin. Remove & Discard patch within 12 hours or as directed by MD  nicotine polacrilex 2 MG lozenge Commonly known as:  COMMIT Take 1 lozenge (2 mg total) by mouth as needed for smoking cessation.   predniSONE 10 MG tablet Commonly known as:  DELTASONE 2 pills daily for 1 week, then 1 pill daily for 1 week   traZODone 100 MG tablet Commonly known as:  DESYREL Take 100 mg by mouth at bedtime.   traZODone 100 MG tablet Commonly known as:  DESYREL TAKE 1 TABLET BY MOUTH AT BEDTIME

## 2016-12-23 ENCOUNTER — Telehealth: Payer: Self-pay | Admitting: Pulmonary Disease

## 2016-12-23 NOTE — Telephone Encounter (Signed)
Called and spoke with patient today regarding results per vs.  Informed the patient of her results today and recommendations per vs. The patient verbalized understanding and denied any questions or concerns at this time. Nothing further needed.

## 2016-12-23 NOTE — Telephone Encounter (Signed)
Dg Chest 2 View  Result Date: 12/22/2016 CLINICAL DATA:  History of right pleural effusion. EXAM: CHEST  2 VIEW COMPARISON:  CT chest 11/23/2016. PA and lateral chest 10/20/2013 and 11/23/2016. FINDINGS: Mild subsegmental atelectasis is seen in the lung bases. No pneumothorax or pleural effusion. Heart size is normal. Marked scoliosis is identified with spinal stabilization hardware in place. IMPRESSION: No acute disease. Electronically Signed   By: Inge Rise M.D.   On: 12/22/2016 14:24    Please let her know that her chest xray showed changes of scoliosis with spine stabilizer.  No other worrisome findings.  No residual signs of pneumonia from October 2018.

## 2017-01-07 ENCOUNTER — Other Ambulatory Visit: Payer: Self-pay

## 2017-01-07 ENCOUNTER — Ambulatory Visit
Admission: RE | Admit: 2017-01-07 | Discharge: 2017-01-07 | Disposition: A | Discharge status: Home / Self Care | Payer: Medicare Other | Source: Ambulatory Visit | Attending: Family Medicine | Admitting: Family Medicine

## 2017-01-07 ENCOUNTER — Ambulatory Visit (HOSPITAL_COMMUNITY)
Admission: RE | Admit: 2017-01-07 | Discharge: 2017-01-07 | Disposition: A | Discharge status: Home / Self Care | Payer: Medicare Other | Source: Ambulatory Visit | Attending: Family Medicine | Admitting: Family Medicine

## 2017-01-07 ENCOUNTER — Ambulatory Visit (INDEPENDENT_AMBULATORY_CARE_PROVIDER_SITE_OTHER): Payer: Medicare Other | Admitting: Family Medicine

## 2017-01-07 ENCOUNTER — Encounter: Payer: Self-pay | Admitting: Family Medicine

## 2017-01-07 VITALS — BP 122/72 | HR 112 | Temp 97.4°F | Ht 69.0 in | Wt 180.0 lb

## 2017-01-07 DIAGNOSIS — R079 Chest pain, unspecified: Secondary | ICD-10-CM | POA: Insufficient documentation

## 2017-01-07 DIAGNOSIS — J441 Chronic obstructive pulmonary disease with (acute) exacerbation: Secondary | ICD-10-CM | POA: Insufficient documentation

## 2017-01-07 DIAGNOSIS — R05 Cough: Secondary | ICD-10-CM | POA: Diagnosis not present

## 2017-01-07 DIAGNOSIS — R000 Tachycardia, unspecified: Secondary | ICD-10-CM | POA: Diagnosis not present

## 2017-01-07 DIAGNOSIS — Z8709 Personal history of other diseases of the respiratory system: Secondary | ICD-10-CM

## 2017-01-07 MED ORDER — LEVOFLOXACIN 750 MG PO TABS: 750.0000 mg | ORAL_TABLET | Freq: Every day | ORAL | 0 refills | Status: DC

## 2017-01-07 MED ORDER — PREDNISONE 20 MG PO TABS: 40.0000 mg | ORAL_TABLET | Freq: Every day | ORAL | 0 refills | Status: AC

## 2017-01-07 NOTE — Assessment & Plan Note (Signed)
Has increased sputum production and dyspnea meeting 2 of 3 cardinal symptoms for a COPD exacerbation. However, her PNA a few months ago presented atypically so will check CXR today. Tachycardia is likely due to albuterol use as patient stated she had needed some albuterol shortly before her appointment and EKG is nonconcerning. Given multiple courses of recent antibiotics treat with levaquin and prednisone for 4 days. Given return precautions.

## 2017-01-07 NOTE — Patient Instructions (Signed)
We will treat you with antibiotic levaquin 750mg  daily for 5 days and prednisone 40mg  daily for 5 days  Please get a chest xray today at [facility], [place], [place] [id number]    Chronic Obstructive Pulmonary Disease Exacerbation Chronic obstructive pulmonary disease (COPD) is a common lung problem. In COPD, the flow of air from the lungs is limited. COPD exacerbations are times that breathing gets worse and you need extra treatment. Without treatment they can be life threatening. If they happen often, your lungs can become more damaged. If your COPD gets worse, your doctor may treat you with:  Medicines.  Oxygen.  Different ways to clear your airway, such as using a mask.  Follow these instructions at home:  Do not smoke.  Avoid tobacco smoke and other things that bother your lungs.  If given, take your antibiotic medicine as told. Finish the medicine even if you start to feel better.  Only take medicines as told by your doctor.  Drink enough fluids to keep your pee (urine) clear or pale yellow (unless your doctor has told you not to).  Use a cool mist machine (vaporizer).  If you use oxygen or a machine that turns liquid medicine into a mist (nebulizer), continue to use them as told.  Keep up with shots (vaccinations) as told by your doctor.  Exercise regularly.  Eat healthy foods.  Keep all doctor visits as told. Get help right away if:  You are very short of breath and it gets worse.  You have trouble talking.  You have bad chest pain.  You have blood in your spit (sputum).  You have a fever.  You keep throwing up (vomiting).  You feel weak, or you pass out (faint).  You feel confused.  You keep getting worse. This information is not intended to replace advice given to you by your health care provider. Make sure you discuss any questions you have with your health care provider. Document Released: 12/17/2010 Document Revised:  06/05/2015 Document Reviewed: 09/01/2012 Elsevier Interactive Patient Education  2017 [employer].

## 2017-01-07 NOTE — Progress Notes (Signed)
    Subjective:  [name] is a 51 y.o. female who presents to the [facility] today with a chief complaint of chest congestion.   HPI:  Chest congestion - started on christmas, has been ongoing for the past 3 days and getting worse because she can normally walk up 3 flights of stairs and now she is having to use her albuterol inhaler more than usual - Was having a productive cough, intermittently yellow and then clear sputum. Her cough is worse at night. - No fever/chills. No nasal congestion or runny nose. No nausea or vomiting - Per chart review, at the beginning of October had a COPD exacerbation that started off with viral symptoms and then subsequent bacterial infection treated by azithromycin. She then had a PNA at the October that presented atypically as R sided rib pain which was treated with levaquin and recently finished a 14d course of prednisone for pleurisy. She states her breathing is worse today than either of those instances.   ROS: Per HPI  Patient history significant for 10 pack yr smoking history  Objective:  Physical Exam: BP 122/72   Pulse (!) 112   Temp (!) 97.4 F (36.3 C) (Oral)   Ht 5\' 9"  (1.753 m)   Wt 180 lb (81.6 kg)   SpO2 94%   BMI 26.58 kg/m   Gen: NAD, sitting up in chair comfortably, well appearing, nontoxic CV: tacycardic, regular with no murmurs appreciated Pulm: NWOB on room air, CTAB with no crackles, wheezes, or rhonchi. GI: Normal bowel sounds present. Soft, Nontender, Nondistended. MSK: no edema, cyanosis, or clubbing noted Skin: warm, dry Neuro: grossly normal, moves all extremities Psych: Normal affect and thought content  EKG without ST changes, consistent with sinus tachycardia  Assessment/Plan:  COPD exacerbation (HCC) Has increased sputum production and dyspnea meeting 2 of 3 cardinal symptoms for a COPD exacerbation. However, her PNA a few months ago presented atypically so will check CXR today. Tachycardia is likely due to  albuterol use as patient stated she had needed some albuterol shortly before her appointment and EKG is nonconcerning. Given multiple courses of recent antibiotics treat with levaquin and prednisone for 4 days. Given return precautions.   Bufford Lope, DO PGY-2, [facility] 01/07/2017 11:25 AM

## 2017-01-24 ENCOUNTER — Ambulatory Visit (INDEPENDENT_AMBULATORY_CARE_PROVIDER_SITE_OTHER): Payer: Medicare Other | Admitting: Pulmonary Disease

## 2017-01-24 ENCOUNTER — Encounter: Payer: Self-pay | Admitting: Pulmonary Disease

## 2017-01-24 VITALS — BP 118/78 | HR 112 | Ht 69.0 in | Wt 181.0 lb

## 2017-01-24 DIAGNOSIS — Z720 Tobacco use: Secondary | ICD-10-CM

## 2017-01-24 NOTE — Progress Notes (Signed)
[facility]  Chief Complaint  Patient presents with  . Follow-up    Pt had prod cough-clear thick mucus 2 weeks ago, PCP stated pt could have COPD. Pt would like to know if this is true diagnois for her. Pt is ready to quite smoking    Vital signs: BP 118/78 (BP Location: Left Arm, Cuff Size: Normal)   Pulse (!) 112   Ht 5\' 9"  (1.753 m)   Wt 181 lb (82.1 kg)   SpO2 96%   BMI 26.73 kg/m   History of Present Illness: [name] is a 52 y.o. female smoker with cough.  Her chest pain resolved after course of prednisone.    She was seen again by PCP for bronchitis.  Improved after course of levaquin.  She was told she might have COPD.  Never had PFTs.  She wants to quit smoking.  She has been using nicotine patch.  She is going to call the quit line.  She isn't sure she can use medicines with history of bipolar.  She is not having much cough.  She denies wheeze, chest pain, sputum, fever, dyspnea, or hemoptysis.  Physical Exam:  General - pleasant Eyes - pupils reactive ENT - no sinus tenderness, no oral exudate, no LAN Cardiac - regular, tachycardic no murmur Chest - no wheeze, rales Abd - soft, non tender Ext - no edema Skin - no rashes Neuro - normal strength Psych - normal mood   Assessment/Plan:  Tobacco abuse. - will arrange for PFTs to further assess for presence of COPD - she will speak with her PCP about whether she can use wellbutrin with history of bipolar - continue nicotine patch - continue prn albuterol  Pleuritis after recent episode of pneumonia. - resolved  Tachycardia. - she will f/u with PCP if this persists  Patient Instructions  Speak with your primary care doctor about whether you can use wellbutrin to help quit smoking  Will arrange for pulmonary function test  Follow up in 6 months    Chesley Mires, MD [place] 01/24/2017, 12:39 PM Pager:  [phone]  Flow  Sheet  Pulmonary tests: CT angio chest 11/06/16 >> consolidation Rt lung, diffuse GGO b/l, air trapping Serology 11/29/16 >> ANA, ACE 89, RF 11.9  Cardiac tests: Echo 12/07/16 >> EF 60%, grade 1DD  Past Medical History: She  has a past medical history of Bipolar 1 disorder ([place]), Chronic kidney disease, Hypertension, Pneumonia (2010), and Pulmonary embolism ([place]) (1994).  Past Surgical History: She  has a past surgical history that includes Endometriosis s/p left fallopian tube removal. (2001); Appendectomy (2001); Nephrectomy living donor (1993); and Tonsillectomy.  Family History: Her family history includes Breast cancer in her maternal grandmother and mother; Colon cancer in her paternal grandmother; Diabetes in her sister; Kidney disease in her sister.  Social History: She  reports that she has been smoking cigarettes.  She has a 10.00 pack-year smoking history. she has never used smokeless tobacco. She reports that she does not drink alcohol or use drugs.  Medications: Allergies as of 01/24/2017      Reactions   Iohexol Hives   Hives 06/06/04, needs pre meds   Morphine And Related Hives, Itching   Denies Airway involvement   Sulfa Antibiotics Itching, Rash   Reported with TMP/ SMX  Denies Airway involvement      Medication List        Accurate as of 01/24/17 12:39 PM. Always use your  most recent med list.          albuterol 108 (90 Base) MCG/ACT inhaler Commonly known as:  PROVENTIL HFA;VENTOLIN HFA Inhale 2 puffs into the lungs every 6 (six) hours as needed for wheezing.   guaiFENesin 600 MG 12 hr tablet Commonly known as:  MUCINEX Take 600 mg by mouth 2 (two) times daily as needed.   hydrochlorothiazide 12.5 MG capsule Commonly known as:  MICROZIDE TAKE 1 CAPSULE (12.5 MG TOTAL) BY MOUTH DAILY.   HYDROcodone-acetaminophen 5-325 MG tablet Commonly known as:  NORCO Take 1 tablet by mouth every 6 (six) hours as needed for severe pain.   lamoTRIgine 200 MG  tablet Commonly known as:  LAMICTAL TAKE 1 TABLET BY MOUTH EVERY DAY   lidocaine 5 % Commonly known as:  LIDODERM Place 1 patch daily onto the skin. Remove & Discard patch within 12 hours or as directed by MD   nicotine polacrilex 2 MG lozenge Commonly known as:  COMMIT Take 1 lozenge (2 mg total) by mouth as needed for smoking cessation.   traZODone 100 MG tablet Commonly known as:  DESYREL Take 100 mg by mouth at bedtime.   traZODone 100 MG tablet Commonly known as:  DESYREL TAKE 1 TABLET BY MOUTH AT BEDTIME

## 2017-01-24 NOTE — Patient Instructions (Signed)
Speak with your primary care doctor about whether you can use wellbutrin to help quit smoking  Will arrange for pulmonary function test  Follow up in 6 months

## 2017-02-02 ENCOUNTER — Other Ambulatory Visit: Payer: Self-pay | Admitting: Family Medicine

## 2017-02-09 DIAGNOSIS — M5416 Radiculopathy, lumbar region: Secondary | ICD-10-CM | POA: Diagnosis not present

## 2017-02-09 DIAGNOSIS — M5136 Other intervertebral disc degeneration, lumbar region: Secondary | ICD-10-CM | POA: Insufficient documentation

## 2017-02-15 DIAGNOSIS — M5136 Other intervertebral disc degeneration, lumbar region: Secondary | ICD-10-CM | POA: Diagnosis not present

## 2017-02-17 ENCOUNTER — Other Ambulatory Visit: Payer: Self-pay | Admitting: Family Medicine

## 2017-05-01 ENCOUNTER — Encounter (HOSPITAL_COMMUNITY): Payer: Self-pay

## 2017-05-01 ENCOUNTER — Ambulatory Visit (HOSPITAL_COMMUNITY)
Admission: EM | Admit: 2017-05-01 | Discharge: 2017-05-01 | Disposition: A | Discharge status: Home / Self Care | Payer: Medicare Other | Attending: Physician Assistant | Admitting: Physician Assistant

## 2017-05-01 ENCOUNTER — Other Ambulatory Visit: Payer: Self-pay

## 2017-05-01 DIAGNOSIS — S29012A Strain of muscle and tendon of back wall of thorax, initial encounter: Secondary | ICD-10-CM | POA: Diagnosis not present

## 2017-05-01 DIAGNOSIS — S46812A Strain of other muscles, fascia and tendons at shoulder and upper arm level, left arm, initial encounter: Secondary | ICD-10-CM

## 2017-05-01 MED ORDER — KETOROLAC TROMETHAMINE 30 MG/ML IJ SOLN
INTRAMUSCULAR | Status: AC
  Filled 2017-05-01: qty 1

## 2017-05-01 MED ORDER — KETOROLAC TROMETHAMINE 30 MG/ML IJ SOLN
30.0000 mg | Freq: Once | INTRAMUSCULAR | Status: AC
  Administered 2017-05-01: 30 mg via INTRAMUSCULAR

## 2017-05-01 NOTE — ED Triage Notes (Signed)
Patient presents to [facility] for left armpit pain x3 days, pt state she is unable to lift arm without having pain under left armpit, pt denies any injuries, pt has been taking OTC medications to treat pain but has no relief

## 2017-05-01 NOTE — Discharge Instructions (Signed)
Please take 2 ibuprofen every 8 hours along with two 500 mg Tylenol every 8 hours.  If you are getting worse please go to the emergency department for further work-up of this problem.

## 2017-05-01 NOTE — ED Provider Notes (Signed)
05/01/2017 12:59 PM   DOB: April 21, 1965 / MRN: [id number]  SUBJECTIVE:  [name] is a 52 y.o. female presenting for left shoulder and arm pain worse with movement.  This pain started on Friday and she tells me the day before she was moving some very heavy potted plants and was having to pull them across her living space.  She tells me "I had no business doing this by myself."  She denies chest pain and shortness of breath.  She denies any recent surgeries.  She is tried small amounts of ibuprofen with mild relief of the pain.  She is a kidney donor.  She tells me "I have no problems with my one kidney in my creatinine is great and I am not passing any protein in my urine."  She is allergic to iohexol; morphine and related; and sulfa antibiotics.   She  has a past medical history of Bipolar 1 disorder ([place]), Chronic kidney disease, Hypertension, Pneumonia (2010), and Pulmonary embolism ([place]) (1994).    She  reports that she has been smoking cigarettes.  She has a 10.00 pack-year smoking history. She has never used smokeless tobacco. She reports that she does not drink alcohol or use drugs. She  reports that she currently engages in sexual activity. She reports using the following method of birth control/protection: Condom. The patient  has a past surgical history that includes Endometriosis s/p left fallopian tube removal. (2001); Appendectomy (2001); Nephrectomy living donor (1993); and Tonsillectomy.  Her family history includes Breast cancer in her maternal grandmother and mother; Colon cancer in her paternal grandmother; Diabetes in her sister; Kidney disease in her sister.  Review of Systems  Constitutional: Negative for chills, diaphoresis and fever.  Eyes: Negative.   Respiratory: Negative for cough, hemoptysis, sputum production, shortness of breath and wheezing.   Cardiovascular: Negative for chest pain, orthopnea and leg swelling.  Gastrointestinal: Negative for nausea.  Skin:  Negative for rash.  Neurological: Negative for dizziness, sensory change, speech change, focal weakness and headaches.    OBJECTIVE:  BP 129/81 (BP Location: Right Arm)   Pulse 95   Temp 98.4 F (36.9 C) (Oral)   Resp 17   SpO2 99%   Physical Exam  Pulmonary/Chest:  Left breast negative for masses.  Axilla negative for lymphadenopathy, mass, rash, warmth, induration.  Musculoskeletal: She exhibits tenderness (She is tender throughout the entire left latissimus dorsi and worse at the insertion point of the latissimus dorsi and teres major.  Brachial pulses 2+ radial pulse 2+.  Sensation intact.  Negative for spasm in the neck.).    No results found for this or any previous visit (from the past 72 hour(s)).  No results found.  ASSESSMENT AND PLAN:  No orders of the defined types were placed in this encounter.    Strain of latissimus dorsi muscle, initial encounter: Conservative therapy at home.  Advised she needs to rest the arm more than anything else.  Medium dose ibuprofen every 8 hours along with Tylenol 1000 every 8.  ED if symptoms are worsening despite treatment.      The patient is advised to call or return to clinic if she does not see an improvement in symptoms, or to seek the care of the closest emergency department if she worsens with the above plan.   Philis Fendt, MHS, PA-C 05/01/2017 12:59 PM    Tereasa Coop, PA-C 05/01/17 1259

## 2017-06-28 ENCOUNTER — Other Ambulatory Visit: Payer: Self-pay | Admitting: Family Medicine

## 2017-06-28 DIAGNOSIS — Z1231 Encounter for screening mammogram for malignant neoplasm of breast: Secondary | ICD-10-CM

## 2017-07-03 ENCOUNTER — Other Ambulatory Visit: Payer: Self-pay | Admitting: Family Medicine

## 2017-08-01 ENCOUNTER — Ambulatory Visit
Admission: RE | Admit: 2017-08-01 | Discharge: 2017-08-01 | Disposition: A | Discharge status: Home / Self Care | Payer: Medicare Other | Source: Ambulatory Visit | Attending: Family Medicine | Admitting: Family Medicine

## 2017-08-01 DIAGNOSIS — Z1231 Encounter for screening mammogram for malignant neoplasm of breast: Secondary | ICD-10-CM

## 2017-08-10 ENCOUNTER — Ambulatory Visit (INDEPENDENT_AMBULATORY_CARE_PROVIDER_SITE_OTHER): Payer: Medicare Other | Admitting: Pulmonary Disease

## 2017-08-10 ENCOUNTER — Ambulatory Visit (INDEPENDENT_AMBULATORY_CARE_PROVIDER_SITE_OTHER)
Admission: RE | Admit: 2017-08-10 | Discharge: 2017-08-10 | Disposition: A | Discharge status: Home / Self Care | Payer: Medicare Other | Source: Ambulatory Visit | Attending: Pulmonary Disease | Admitting: Pulmonary Disease

## 2017-08-10 ENCOUNTER — Encounter: Payer: Self-pay | Admitting: Pulmonary Disease

## 2017-08-10 VITALS — BP 114/70 | HR 114 | Ht 69.0 in | Wt 171.4 lb

## 2017-08-10 DIAGNOSIS — R000 Tachycardia, unspecified: Secondary | ICD-10-CM | POA: Diagnosis not present

## 2017-08-10 DIAGNOSIS — R0789 Other chest pain: Secondary | ICD-10-CM | POA: Diagnosis not present

## 2017-08-10 DIAGNOSIS — Z720 Tobacco use: Secondary | ICD-10-CM | POA: Diagnosis not present

## 2017-08-10 DIAGNOSIS — R079 Chest pain, unspecified: Secondary | ICD-10-CM | POA: Diagnosis not present

## 2017-08-10 NOTE — Progress Notes (Signed)
[facility]  Chief Complaint  Patient presents with  . Follow-up    Having pain in back, thinks is it pleurisy    Vital signs: BP 114/70 (BP Location: Left Arm, Patient Position: Sitting, Cuff Size: Normal)   Pulse (!) 114   Ht 5\' 9"  (1.753 m)   Wt 171 lb 6.4 oz (77.7 kg)   SpO2 97%   BMI 25.31 kg/m   History of Present Illness: [name] is a 52 y.o. female smoker with cough.  She was doing better until a few days ago.  She developed pain in her right chest and back.  Feels like a sharp pain around her scapula.  Hurts when she moves and hard to move her right arm.  Different than when she had pleurisy.  Not having fever, cough, dyspnea, sputum, hemoptysis, abdominal pain, rash, or swelling.  Still smoking.   Physical Exam:  Appearance - well kempt  ENMT - nasal mucosa moist, turbinates clear, midline nasal septum, no dental lesions, no gingival bleeding, no oral exudates, no tonsillar hypertrophy Neck - no masses, trachea midline, no thyromegaly, no elevation in JVP Respiratory - normal appearance of chest wall, normal respiratory effort w/o accessory muscle use, no dullness on percussion, no tactile fremitus, no wheezing or rales CV - s1s2 regular rate and rhythm, tachycardic, no murmurs, no peripheral edema, no varicosities, radial pulses symmetric GI - soft, non tender, no masses, no hepatosplenomegaly Lymph - no adenopathy noted in neck and axillary areas MSK - normal muscle strength and tone, normal gait, pain on palpation over back Ext - no cyanosis, clubbing, or joint inflammation noted Skin - no rashes, lesions, or ulcers Neuro - oriented to person, place, and time Psych - normal mood and affect   Assessment/Plan:  Atypical chest pain. - seems musculoskeletal - will get chest xray - f/u with orthopedics  Tobacco abuse. - check with PCP about whether she could use bupropion  Tachycardia. - advised her to d/w her  PCP  Patient Instructions  Chest xray today  Will call to schedule follow up as needed based on chest xray results    Chesley Mires, MD [place] 08/10/2017, 3:56 PM Pager:  [phone]  Flow Sheet  Pulmonary tests: CT angio chest 11/06/16 >> consolidation Rt lung, diffuse GGO b/l, air trapping Serology 11/29/16 >> ANA, ACE 89, RF 11.9  Cardiac tests: Echo 12/07/16 >> EF 60%, grade 1DD  Past Medical History: She  has a past medical history of Bipolar 1 disorder ([place]), Chronic kidney disease, Hypertension, Pneumonia (2010), and Pulmonary embolism ([place]) (1994).  Past Surgical History: She  has a past surgical history that includes Endometriosis s/p left fallopian tube removal. (2001); Appendectomy (2001); Nephrectomy living donor (1993); and Tonsillectomy.  Family History: Her family history includes Breast cancer in her maternal grandmother and mother; Colon cancer in her paternal grandmother; Diabetes in her sister; Kidney disease in her sister.  Social History: She  reports that she has been smoking cigarettes.  She has a 10.00 pack-year smoking history. She has never used smokeless tobacco. She reports that she does not drink alcohol or use drugs.  Medications: Allergies as of 08/10/2017      Reactions   Iohexol Hives   Hives 06/06/04, needs pre meds   Morphine And Related Hives, Itching   Denies Airway involvement   Sulfa Antibiotics Itching, Rash   Reported with TMP/ SMX  Denies Airway involvement      Medication  List        Accurate as of 08/10/17  3:56 PM. Always use your most recent med list.          albuterol 108 (90 Base) MCG/ACT inhaler Commonly known as:  PROVENTIL HFA;VENTOLIN HFA Inhale 2 puffs into the lungs every 6 (six) hours as needed for wheezing.   hydrochlorothiazide 12.5 MG capsule Commonly known as:  MICROZIDE TAKE 1 CAPSULE (12.5 MG TOTAL) BY MOUTH DAILY.   HYDROcodone-acetaminophen 5-325 MG tablet Commonly known  as:  NORCO Take 1 tablet by mouth every 6 (six) hours as needed for severe pain.   lamoTRIgine 200 MG tablet Commonly known as:  LAMICTAL TAKE 1 TABLET BY MOUTH EVERY DAY   lidocaine 5 % Commonly known as:  LIDODERM Place 1 patch daily onto the skin. Remove & Discard patch within 12 hours or as directed by MD   nicotine polacrilex 2 MG lozenge Commonly known as:  COMMIT Take 1 lozenge (2 mg total) by mouth as needed for smoking cessation.   traZODone 100 MG tablet Commonly known as:  DESYREL Take 100 mg by mouth at bedtime.   traZODone 100 MG tablet Commonly known as:  DESYREL TAKE 1 TABLET BY MOUTH AT BEDTIME

## 2017-08-10 NOTE — Patient Instructions (Signed)
Chest xray today  Will call to schedule follow up as needed based on chest xray results

## 2017-08-11 ENCOUNTER — Telehealth: Payer: Self-pay | Admitting: Pulmonary Disease

## 2017-08-11 NOTE — Telephone Encounter (Signed)
Called and spoke with patient regarding results.  Informed the patient of results and recommendations today. Pt verbalized understanding and denied any questions or concerns at this time.  Nothing further needed.  

## 2017-08-11 NOTE — Telephone Encounter (Signed)
Dg Chest 2 View  Result Date: 08/11/2017 CLINICAL DATA:  Atypical chest pain. History of hypertension, and smoker. EXAM: CHEST - 2 VIEW COMPARISON:  01/07/2017. FINDINGS: The heart size and mediastinal contours are within normal limits. Both lungs are free of consolidation or edema. Bronchitic changes are present in the perihilar regions. LEFT basilar scarring. No effusion or pneumothorax. The visualized skeletal structures show no acute findings. Old LEFT rib fractures. Harrington rod placement for thoracolumbar scoliosis convex LEFT. Surgical clips below the diaphragm. IMPRESSION: Stable changes of what is likely COPD.  No active disease. Electronically Signed   By: Staci Righter M.D.   On: 08/11/2017 08:17     Please let her know her chest xray didn't show any evidence of pneumonia or fluid around the lungs.  Her symptoms are likely related to her back issues and she should f/u with orthopedics.

## 2017-09-14 DIAGNOSIS — L309 Dermatitis, unspecified: Secondary | ICD-10-CM | POA: Diagnosis not present

## 2017-10-12 ENCOUNTER — Ambulatory Visit (HOSPITAL_COMMUNITY)
Admission: EM | Admit: 2017-10-12 | Discharge: 2017-10-12 | Disposition: A | Discharge status: Home / Self Care | Payer: Medicare Other

## 2017-10-13 ENCOUNTER — Ambulatory Visit (INDEPENDENT_AMBULATORY_CARE_PROVIDER_SITE_OTHER): Payer: Medicare Other | Admitting: Family Medicine

## 2017-10-13 ENCOUNTER — Other Ambulatory Visit: Payer: Self-pay

## 2017-10-13 VITALS — BP 130/80 | HR 108 | Temp 98.2°F | Wt 169.0 lb

## 2017-10-13 DIAGNOSIS — Z23 Encounter for immunization: Secondary | ICD-10-CM | POA: Diagnosis not present

## 2017-10-13 DIAGNOSIS — M546 Pain in thoracic spine: Secondary | ICD-10-CM

## 2017-10-13 MED ORDER — CYCLOBENZAPRINE HCL 5 MG PO TABS: 7.5000 mg | ORAL_TABLET | Freq: Two times a day (BID) | ORAL | 0 refills | Status: DC | PRN

## 2017-10-13 NOTE — Patient Instructions (Signed)
It was nice meeting you today.  You were seen in clinic for pain on the right side of your back.  As we discussed, it is possible this is related to a muscle spasm.  I have prescribed you some Flexeril which you can take 2 times a day as needed.  I would advise being careful operating heavy machinery and driving after taking Flexeril as it may make you sleepy.   I would also recommend continuing topical ointment such as Tiger balm and sleeping with a heating pad as both of these will provide relief in your symptoms.  Please call clinic if you have any questions.  Lovenia Kim MD

## 2017-10-13 NOTE — Progress Notes (Addendum)
   Subjective:   Patient ID: [name]    DOB: 04/26/1965, 52 y.o. female   MRN: [id number]  CC: mid-back pain  HPI: [name] is a 52 y.o. female who presents to clinic today for the following issue.   Back pain Reports she has a h/o scoliosis, has a Harrington rod in her back which was placed about 40 years ago.  Pain is localized to R-sided  mid-back pain since Monday night. Did not lift anything heavy. No h/o falls or injury. Pain is sharp, not worse with cough or deep breathing . Her sister rubbed tiger balm on it and she slept with a heating pad which helped. Took some ibuprofen which did not help much. No history of kidney stones.  She states about 1 year ago had similar pain and underwent a workup for kidney stones which was negative.    Health maintenance: -Due for flu and pneumococcal shots  ROS: No fever, chills, nausea, vomiting. +back pain, no numbness or tingling.   Social: She is a current everyday smoker.  Medications reviewed. Objective:   BP 130/80   Pulse (!) 108   Temp 98.2 F (36.8 C) (Oral)   Wt 169 lb (76.7 kg)   SpO2 98%   BMI 24.96 kg/m  Vitals and nursing note reviewed.  General: 52 year old female, NAD Neck: supple, non-tender, normal ROM CV: RRR no MRG  Lungs: CTAB, normal effort  Abdomen: soft, +bs  Spine: No obvious bony deformity, large midline scar from previous back surgery.  Range of motion is restricted 2/2 Harrington rod.  No tenderness to palpation over thoracic vertebrae.  Mild tenderness and firmness to right thoracic paraspinal musculature.   Skin: warm, dry, no rash  Extremities: warm and well perfused Neuro: alert, oriented x3, no focal deficits, negative straight leg test bilaterally, normal gait and sensation.   Assessment & Plan:   Thoracic back pain Acute, likely 2/2 spasm.  No obvious deformity on physical exam and neuro exam is normal. No red flags noted.  Discussed gentle stretching exercises and trial of muscle  relaxant. -Rx: Flexeril 7.5 mg, 20 tabs -return precautions discussed  Health maintenance: -Flu shot given today -Pneumococcal vaccination given  Orders Placed This Encounter  Procedures  . Flu Vaccine QUAD 36+ mos IM  . Pneumococcal polysaccharide vaccine 23-valent greater than or equal to 2yo subcutaneous/IM   Meds ordered this encounter  Medications  . cyclobenzaprine (FLEXERIL) 5 MG tablet    Sig: Take 1.5 tablets (7.5 mg total) by mouth 2 (two) times daily as needed for muscle spasms.    Dispense:  20 tablet    Refill:  0   Lovenia Kim, MD [place] PGY-3

## 2017-10-21 DIAGNOSIS — M546 Pain in thoracic spine: Secondary | ICD-10-CM | POA: Insufficient documentation

## 2017-10-21 NOTE — Assessment & Plan Note (Signed)
Acute, likely 2/2 spasm.  No obvious deformity on physical exam and neuro exam is normal. No red flags noted.  Discussed gentle stretching exercises and trial of muscle relaxant. -Rx: Flexeril 7.5 mg, 20 tabs -return precautions discussed

## 2017-11-22 ENCOUNTER — Other Ambulatory Visit (HOSPITAL_COMMUNITY)
Admission: RE | Admit: 2017-11-22 | Discharge: 2017-11-22 | Disposition: A | Discharge status: Home / Self Care | Payer: Medicare Other | Source: Ambulatory Visit | Attending: Family Medicine | Admitting: Family Medicine

## 2017-11-22 ENCOUNTER — Other Ambulatory Visit: Payer: Self-pay

## 2017-11-22 ENCOUNTER — Ambulatory Visit (INDEPENDENT_AMBULATORY_CARE_PROVIDER_SITE_OTHER): Payer: Medicare Other | Admitting: Family Medicine

## 2017-11-22 ENCOUNTER — Encounter: Payer: Self-pay | Admitting: Family Medicine

## 2017-11-22 VITALS — BP 130/72 | HR 121 | Temp 98.4°F | Ht 69.0 in | Wt 174.8 lb

## 2017-11-22 DIAGNOSIS — Z0000 Encounter for general adult medical examination without abnormal findings: Secondary | ICD-10-CM

## 2017-11-22 DIAGNOSIS — Z124 Encounter for screening for malignant neoplasm of cervix: Secondary | ICD-10-CM

## 2017-11-22 NOTE — Progress Notes (Signed)
[name] is a 52 y.o. female who presents to [place] today for a comprehensive physical examination:  CPE  Concerns:  None today.  Some increased stress with her son who has been struggling with mania alternating with depression due to Bipolar 1 disorder.  She herself has this same diagnosis but has been doing well with lamictal.  She continues to smoke 1/2 ppd due to stress from her son.   Last physical last year Tetanus overdue Flu vaccine up to date  Eye exam:  Up to date Dental exam every six months.   PMH reviewed. Patient is a nonsmoker.   Past Medical History:  Diagnosis Date  . Bipolar 1 disorder ([place])   . Chronic kidney disease    donated left kidney  . Hypertension   . Pneumonia 2010   had 2 episodes/ was sent home on O2  . Pulmonary embolism ([place]) 1994   Only 1 clot, not on lifelong anticoagulation   Past Surgical History:  Procedure Laterality Date  . APPENDECTOMY  2001  . Endometriosis s/p left fallopian tube removal.  2001  . 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 TONSILLECTOMY      Medications reviewed. Current Outpatient Medications  Medication Sig Dispense Refill  . albuterol (PROVENTIL HFA;VENTOLIN HFA) 108 (90 Base) MCG/ACT inhaler Inhale 2 puffs into the lungs every 6 (six) hours as needed for wheezing. 1 Inhaler 5  . hydrochlorothiazide (MICROZIDE) 12.5 MG capsule TAKE 1 CAPSULE (12.5 MG TOTAL) BY MOUTH DAILY. 90 capsule 2  . lamoTRIgine (LAMICTAL) 200 MG tablet TAKE 1 TABLET BY MOUTH EVERY DAY 30 tablet 6  . traZODone (DESYREL) 100 MG tablet TAKE 1 TABLET BY MOUTH AT BEDTIME 60 tablet 5   Current Facility-Administered Medications  Medication Dose Route Frequency Provider Last Rate Last Dose  . 0.9 %  sodium chloride infusion  500 mL Intravenous Continuous Nandigam, Venia Minks, MD        Social: Smoking history:  As above  Alcohol use:  Very occasionally Illicit drug use:  denies Relationship status:    single  Family History:  Family history of bipolar disorder.    Review of Systems  Constitutional: Negative for fever.  HENT: Negative for congestion, ear discharge, ear pain and hearing loss.   Eyes: Negative for blurred vision.  Respiratory: Negative for wheezing.   Cardiovascular: Negative for chest pain, palpitations and leg swelling.  Gastrointestinal: Negative for nausea, vomiting and abdominal pain.  Genitourinary: Negative for dysuria, hematuria and flank pain.  Musculoskeletal: Negative for neck pain.  Skin: Negative for rash.  Neurological: Negative for dizziness and headaches.  Psychiatric/Behavioral: Negative for depression and suicidal ideas.   Exam: BP 130/72   Pulse (!) 121   Temp 98.4 F (36.9 C) (Oral)   Ht 5\' 9"  (1.753 m)   Wt 174 lb 12.8 oz (79.3 kg)   SpO2 97%   BMI 25.81 kg/m  Gen:  Alert, cooperative patient who appears stated age in no acute distress.  Vital signs reviewed. Head: [place]/AT.   Eyes:  EOMI, PERRL.   Ears:  External ears WNL, Bilateral TM's normal without retraction, redness or bulging. Nose:  Septum midline  Mouth:  MMM, tonsils non-erythematous, non-edematous.   Neck: No masses or thyromegaly or limitation in range of motion.  No cervical lymphadenopathy. Pulm:  Clear to auscultation bilaterally with good air movement.  No wheezes or rales noted.   Cardiac:  Mildly elevated HR today  with Grade II/VI SEM noted throughout precordium  Abd:  Soft/nondistended/nontender.  Good bowel sounds throughout all four quadrants.  No masses noted.  Ext:  No clubbing/cyanosis/erythema.  No edema noted bilateral lower extremities.   Neuro:  Grossly normal, no gait abnormalities Psych:  Not depressed or anxious appearing.  Conversant and engaged  Impression/Plan: 1. Complete Physical Examination: anticipatory guidance provided.  S/p TDAP script to take to pharmacy (Medicare patient) 2.  Screening cholesterol: obtain FLP. 3.  Smoker:  Counseling provided  today.  Currently alternates between pre-contemplative and contemplative.   4.  Tachycardia:  With known heart murmur.  Ongoing issue for past year or two based on review of vital signs in past.  She reports to me that she has a PFO, which would be likely culprit.  However I dont' seen any evidence of this on 2 prior Echos?? - Will refer to cardiology for further input and any recommendations regarding PFO and chronic tachycardia.

## 2017-11-22 NOTE — Patient Instructions (Signed)
It was good to see you again today.  I would like to refer you to a cardiologist just to make sure everything is going ok with your heart.    We are checking labs and I'll let you know the results.

## 2017-11-23 ENCOUNTER — Encounter: Payer: Self-pay | Admitting: Family Medicine

## 2017-11-23 DIAGNOSIS — Z23 Encounter for immunization: Secondary | ICD-10-CM | POA: Diagnosis not present

## 2017-11-23 DIAGNOSIS — L821 Other seborrheic keratosis: Secondary | ICD-10-CM | POA: Diagnosis not present

## 2017-11-23 DIAGNOSIS — D72820 Lymphocytosis (symptomatic): Secondary | ICD-10-CM

## 2017-11-23 LAB — LIPID PANEL
CHOL/HDL RATIO: 3.6 ratio (ref 0.0–4.4)
Cholesterol, Total: 194 mg/dL (ref 100–199)
HDL: 54 mg/dL (ref >39)
LDL Calculated: 118 mg/dL — ABNORMAL HIGH (ref 0–99)
TRIGLYCERIDES: 112 mg/dL (ref 0–149)
VLDL Cholesterol Cal: 22 mg/dL (ref 5–40)

## 2017-11-23 LAB — CBC WITH DIFFERENTIAL/PLATELET
BASOS: 1 %
Basophils Absolute: 0.1 10*3/uL (ref 0.0–0.2)
EOS (ABSOLUTE): 0 10*3/uL (ref 0.0–0.4)
EOS: 0 %
HEMATOCRIT: 42.4 % (ref 34.0–46.6)
HEMOGLOBIN: 14.2 g/dL (ref 11.1–15.9)
Immature Grans (Abs): 0 10*3/uL (ref 0.0–0.1)
Immature Granulocytes: 0 %
Lymphocytes Absolute: 5.1 10*3/uL — ABNORMAL HIGH (ref 0.7–3.1)
Lymphs: 44 %
MCH: 26.8 pg (ref 26.6–33.0)
MCHC: 33.5 g/dL (ref 31.5–35.7)
MCV: 80 fL (ref 79–97)
MONOCYTES: 5 %
Monocytes Absolute: 0.6 10*3/uL (ref 0.1–0.9)
NEUTROS ABS: 6 10*3/uL (ref 1.4–7.0)
Neutrophils: 50 %
Platelets: 415 10*3/uL (ref 150–450)
RBC: 5.3 x10E6/uL — ABNORMAL HIGH (ref 3.77–5.28)
RDW: 13.1 % (ref 12.3–15.4)
WBC: 11.8 10*3/uL — ABNORMAL HIGH (ref 3.4–10.8)

## 2017-11-23 LAB — COMPREHENSIVE METABOLIC PANEL
A/G RATIO: 1.3 (ref 1.2–2.2)
ALBUMIN: 4 g/dL (ref 3.5–5.5)
ALT: 10 IU/L (ref 0–32)
AST: 13 IU/L (ref 0–40)
Alkaline Phosphatase: 81 IU/L (ref 39–117)
BUN / CREAT RATIO: 16 (ref 9–23)
BUN: 17 mg/dL (ref 6–24)
CALCIUM: 9.6 mg/dL (ref 8.7–10.2)
CHLORIDE: 102 mmol/L (ref 96–106)
CO2: 25 mmol/L (ref 20–29)
Creatinine, Ser: 1.06 mg/dL — ABNORMAL HIGH (ref 0.57–1.00)
GFR, EST AFRICAN AMERICAN: 69 mL/min/{1.73_m2} (ref >59)
GFR, EST NON AFRICAN AMERICAN: 60 mL/min/{1.73_m2} (ref >59)
GLOBULIN, TOTAL: 3 g/dL (ref 1.5–4.5)
Glucose: 88 mg/dL (ref 65–99)
POTASSIUM: 4.6 mmol/L (ref 3.5–5.2)
SODIUM: 142 mmol/L (ref 134–144)
TOTAL PROTEIN: 7 g/dL (ref 6.0–8.5)

## 2017-11-23 LAB — HIV ANTIBODY (ROUTINE TESTING W REFLEX): HIV SCREEN 4TH GENERATION: NONREACTIVE

## 2017-11-23 LAB — TSH: TSH: 2.03 u[IU]/mL (ref 0.450–4.500)

## 2017-11-24 LAB — CYTOLOGY - PAP
DIAGNOSIS: NEGATIVE
HPV (WINDOPATH): NOT DETECTED

## 2017-11-25 ENCOUNTER — Other Ambulatory Visit: Payer: Self-pay | Admitting: Family Medicine

## 2017-11-30 ENCOUNTER — Telehealth: Payer: Self-pay | Admitting: *Deleted

## 2017-11-30 DIAGNOSIS — R000 Tachycardia, unspecified: Secondary | ICD-10-CM

## 2017-11-30 NOTE — Telephone Encounter (Signed)
Pt calls to check status of cardiology referral.  Nothing under referrals, will forward to MD. Natassja Ollis, Salome Spotted, [place]

## 2017-11-30 NOTE — Telephone Encounter (Signed)
Should have been referred last visit for persistent tachycardia for years.  Plan referral today.

## 2017-12-06 DIAGNOSIS — D72829 Elevated white blood cell count, unspecified: Secondary | ICD-10-CM

## 2017-12-06 HISTORY — DX: Elevated white blood cell count, unspecified: D72.829

## 2017-12-06 NOTE — Assessment & Plan Note (Signed)
Fairly persistent over the past year or so.  Will recheck in about 3 months (Jan/Feb 2020).  If still elevated, plan to refer for further w/u to hematology oncology.  She's had no symptoms of night sweats, weight loss, cough, B symptoms.

## 2017-12-07 ENCOUNTER — Ambulatory Visit (INDEPENDENT_AMBULATORY_CARE_PROVIDER_SITE_OTHER): Payer: Medicare Other | Admitting: Cardiology

## 2017-12-07 ENCOUNTER — Encounter: Payer: Self-pay | Admitting: Cardiology

## 2017-12-07 VITALS — BP 118/64 | HR 105 | Ht 69.0 in | Wt 175.0 lb

## 2017-12-07 DIAGNOSIS — Z7189 Other specified counseling: Secondary | ICD-10-CM

## 2017-12-07 DIAGNOSIS — Z716 Tobacco abuse counseling: Secondary | ICD-10-CM | POA: Diagnosis not present

## 2017-12-07 DIAGNOSIS — I10 Essential (primary) hypertension: Secondary | ICD-10-CM

## 2017-12-07 DIAGNOSIS — R000 Tachycardia, unspecified: Secondary | ICD-10-CM | POA: Insufficient documentation

## 2017-12-07 DIAGNOSIS — F17210 Nicotine dependence, cigarettes, uncomplicated: Secondary | ICD-10-CM

## 2017-12-07 HISTORY — DX: Tachycardia, unspecified: R00.0

## 2017-12-07 NOTE — Progress Notes (Signed)
Cardiology Office Note:    Date:  12/07/2017   ID:  [name], DOB 27-Jan-1965, MRN [id number]  PCP:  Alveda Reasons, MD  Cardiologist:  Buford Dresser, MD PhD  Referring MD: Alveda Reasons, MD   CC: establish care  History of Present Illness:    [name] is a 52 y.o. female with a hx of hypertension who is seen as a new consult at the request of Alveda Reasons, MD for the evaluation and management of tachycardia. Per PCP notes, she has a history of PFO, and she has had issue with tachycardia for a few years.  Patient concerns today: Overall feeling well. Patient was told that last year she has diastolic dysfunction on her echo, wants to review this. Has been told she has a murmur in the past, unaware of it ever being called a PFO. She is unaware of her heart rate but notes that it has been elevated on checkups in the past. No palpitations, no syncope.   Reviewed triggers for tachycardia. On her ECGs from last fall, she notes that she was having a lot of right sided flank/back pain, which has since resolved. Current potential causes: One cup of coffee every morning, occasional tea, 1-2 sodas/day (diet mtn dew). Takes cinnamon tablets, tonalin CLA tabs. Only rare alcohol. Smoker: 3/4 ppd for about 15 years. Walks a small amount every day, lives on the third floor and climbs the stairs multiple times/day. No intentional exercise.  Stopped taking a weight loss supplement two months ago ("phen" something).   Has rare twinges in her left shoulder region, like a numbness. Not exertional, not terribly bothersome, no associated symptoms, no aggravating/alleviating factors, lasts 1-2 minutes and then goes away on its own.   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 Blood pressure has been well controlled.  Review of tachycardia: was in pain at the time per patient at all ECGs ECG 01/07/2017: HR Sinus tach at 115 bpm  11/29/16: HR sinus tach at 111  bpm (right sided pain) 11/06/16: Sinus tach at 115  Documented vitals: HR in 02/2016 was 118  HR 11/08/14 was 104 HR 01/15/14 was 101 HR in ER 10/20/13 was 133 (noted to have bronchitis)  Past Medical History:  Diagnosis Date  . Bipolar 1 disorder ([place])   . Chronic kidney disease    donated left kidney  . Hypertension   . Pneumonia 2010   had 2 episodes/ was sent home on O2  . Pulmonary embolism ([place]) 1994   Only 1 clot, not on lifelong anticoagulation    Past Surgical History:  Procedure Laterality Date  . APPENDECTOMY  2001  . Endometriosis s/p left fallopian tube removal.  2001  . 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 TONSILLECTOMY      Current Medications: Current Outpatient Medications on File Prior to Visit  Medication Sig  . albuterol (PROVENTIL HFA;VENTOLIN HFA) 108 (90 Base) MCG/ACT inhaler Inhale 2 puffs into the lungs every 6 (six) hours as needed for wheezing.  . hydrochlorothiazide (MICROZIDE) 12.5 MG capsule TAKE 1 CAPSULE (12.5 MG TOTAL) BY MOUTH DAILY.  Marland Kitchen lamoTRIgine (LAMICTAL) 200 MG tablet TAKE 1 TABLET BY MOUTH EVERY DAY  . traZODone (DESYREL) 100 MG tablet TAKE 1 TABLET BY MOUTH AT BEDTIME   Current Facility-Administered Medications on File Prior to Visit  Medication  . 0.9 %  sodium chloride infusion     Allergies:  Iohexol; Morphine and related; and Sulfa antibiotics   Social History   Socioeconomic History  . Marital status: Divorced    Spouse name: Not on file  . Number of children: Not on file  . Years of education: Not on file  . Highest education level: Not on file  Occupational History  . Not on file  Social Needs  . Financial resource strain: Not on file  . Food insecurity:    Worry: Not on file    Inability: Not on file  . Transportation needs:    Medical: Not on file    Non-medical: Not on file  Tobacco Use  . Smoking status: Current Every Day Smoker    Packs/day: 0.50    Years: 20.00    Pack years:  10.00    Types: Cigarettes  . Smokeless tobacco: Never Used  Substance and Sexual Activity  . Alcohol use: No    Alcohol/week: 0.0 standard drinks  . Drug use: No  . Sexual activity: Yes    Birth control/protection: Condom    Comment: Partner with Vasectomy  Lifestyle  . Physical activity:    Days per week: Not on file    Minutes per session: Not on file  . Stress: Not on file  Relationships  . Social connections:    Talks on phone: Not on file    Gets together: Not on file    Attends religious service: Not on file    Active member of club or organization: Not on file    Attends meetings of clubs or organizations: Not on file    Relationship status: Not on file  Other Topics Concern  . Not on file  Social History Narrative  . Not on file     Family History: The patient's family history includes Breast cancer in her maternal grandmother and mother; Colon cancer in her paternal grandmother; Diabetes in her sister; Kidney disease in her sister.  ROS:   Please see the history of present illness.  Additional pertinent ROS:  Constitutional: Negative for chills, fever, night sweats, unintentional weight loss  HENT: Negative for ear pain and hearing loss.   Eyes: Negative for loss of vision and eye pain.  Respiratory: Negative for cough, sputum, shortness of breath, wheezing.   Cardiovascular: Negative for chest pain, palpitations, PND, orthopnea, lower extremity edema and claudication.  Gastrointestinal: Negative for abdominal pain, melena, and hematochezia.  Genitourinary: Negative for dysuria and hematuria.  Musculoskeletal: Negative for falls and myalgias.  Skin: Negative for itching and rash.  Neurological: Negative for focal weakness, focal sensory changes and loss of consciousness.  Endo/Heme/Allergies: Does not bruise/bleed easily.    EKGs/Labs/Other Studies Reviewed:    The following studies were reviewed today: Echo 12/07/2016 Study Conclusions - Left  ventricle: The cavity size was normal. Wall thickness was   normal. The estimated ejection fraction was 60%. Wall motion was   normal; there were no regional wall motion abnormalities. Doppler   parameters are consistent with abnormal left ventricular   relaxation (grade 1 diastolic dysfunction). - Aortic valve: There was no stenosis. - Mitral valve: There was trivial regurgitation. - Right ventricle: The cavity size was normal. Systolic function   was normal. - Pulmonary arteries: No complete TR doppler jet so unable to   estimate PA systolic pressure. - Inferior vena cava: The vessel was normal in size. The   respirophasic diameter changes were in the normal range (>= 50%),   consistent with normal central venous pressure.  Impressions: -  Normal LV size with EF 60%. Normal RV size and systolic function.   No significant valvular abnormalities.  EKG:  EKG is personally reviewed.  The ekg ordered today demonstrates sinus tachycardia with a rate of 106 bpm  Recent Labs: 11/22/2017: ALT 10; BUN 17; Creatinine, Ser 1.06; Hemoglobin 14.2; Platelets 415; Potassium 4.6; Sodium 142; TSH 2.030  Recent Lipid Panel    Component Value Date/Time   CHOL 194 11/22/2017 1054   TRIG 112 11/22/2017 1054   HDL 54 11/22/2017 1054   CHOLHDL 3.6 11/22/2017 1054   CHOLHDL 3.6 11/25/2015 1408   VLDL 29 11/25/2015 1408   LDLCALC 118 (H) 11/22/2017 1054    Physical Exam:    VS:  BP 118/64   Pulse (!) 105   Ht 5\' 9"  (1.753 m)   Wt 175 lb (79.4 kg)   BMI 25.84 kg/m     Wt Readings from Last 3 Encounters:  12/07/17 175 lb (79.4 kg)  11/22/17 174 lb 12.8 oz (79.3 kg)  10/13/17 169 lb (76.7 kg)     GEN: Well nourished, well developed in no acute distress HEENT: Normal NECK: No JVD; No carotid bruits LYMPHATICS: No lymphadenopathy CARDIAC: regular rhythm, normal S1 and S2, no murmurs, rubs, gallops. Radial and DP pulses 2+ bilaterally. RESPIRATORY:  Clear to auscultation without rales,  wheezing or rhonchi  ABDOMEN: Soft, non-tender, non-distended MUSCULOSKELETAL:  No edema; No deformity  SKIN: Warm and dry NEUROLOGIC:  Alert and oriented x 3 PSYCHIATRIC:  Normal affect   ASSESSMENT:    1. Sinus tachycardia   2. Tobacco abuse counseling   3. Essential hypertension   4. Counseling on health promotion and disease prevention    PLAN:    1. Sinus tachycardia: no palpitations, appears long standing on review of the chart. Likely impacted by tobacco use, caffeine, low cardiovascular conditioning level, and perhaps her personal baseline. No infection or pain currently. Not bothersome or limiting to her in any way. Echo done 1 year ago, largely unremarkable -would not treat simply because it is elevated -encouraged lifestyle changes. She is going to focus on increasing her activity level, and she is also going to work on tobacco cessation. We will check in on these goals at follow up.  2. Tobacco abuse, counseled to quit: The patient was counseled on tobacco cessation today for 10 minutes.  Counseling included reviewing the risks of smoking tobacco products, how it impacts the patient's current medical diagnoses and different strategies for quitting.  Pharmacotherapy to aid in tobacco cessation was not prescribed today.  3. Primary prevention and heath counseling: -recommend heart healthy/Mediterranean diet, with whole grains, fruits, vegetable, fish, lean meats, nuts, and olive oil. Limit salt. -recommend moderate walking, 3-5 times/week for 30-50 minutes each session. Aim for at least 150 minutes.week. Goal should be pace of 3 miles/hours, or walking 1.5 miles in 30 minutes -recommend avoidance of tobacco products. Avoid excess alcohol. -Additional risk factor control:  -Diabetes: A1c is not recently available, no diabetes diagnosis  -Lipids: LDL 118  -Blood pressure control: history of hypertension, at goal on HCTZ  -Weight: BMI 25.8 -ASCVD risk score: The 10-year ASCVD  risk score Mikey Bussing DC Jr., et al., 2013) is: 6.2%   Values used to calculate the score:     Age: 37 years     Sex: Female     Is Non-Hispanic African American: Yes     Diabetic: No     Tobacco smoker: Yes     Systolic Blood Pressure:  118 mmHg     Is BP treated: Yes     HDL Cholesterol: 54 mg/dL     Total Cholesterol: 194 mg/dL   Plan for follow up: 6 mos  Medication Adjustments/Labs and Tests Ordered: Current medicines are reviewed at length with the patient today.  Concerns regarding medicines are outlined above.  Orders Placed This Encounter  Procedures  . EKG 12-Lead   No orders of the defined types were placed in this encounter.   Patient Instructions  Medication Instructions:  Your Physician recommend you continue on your current medication as directed.    If you need a refill on your cardiac medications before your next appointment, please call your pharmacy.   Lab work: None  Testing/Procedures: None  Follow-Up: At [facility], you and your health needs are our priority.  As part of our continuing mission to provide you with exceptional heart care, we have created designated Provider Care Teams.  These Care Teams include your primary Cardiologist (physician) and Advanced Practice Providers (APPs -  Physician Assistants and Nurse Practitioners) who all work together to provide you with the care you need, when you need it. You will need a follow up appointment in 6 months.  Please call our office 2 months in advance to schedule this appointment.  You may see Dr. Lynnae January or one of the following Advanced Practice Providers on your designated Care Team:   Rosaria Ferries, PA-C . Jory Sims, DNP, ANP  Any Other Special Instructions Will Be Listed Below (If Applicable).    Mediterranean Diet A Mediterranean diet refers to food and lifestyle choices that are based on the traditions of countries located on the [employer]. This way of eating has been shown  to help prevent certain conditions and improve outcomes for people who have chronic diseases, like kidney disease and heart disease. What are tips for following this plan? Lifestyle  Cook and eat meals together with your family, when possible.  Drink enough fluid to keep your urine clear or pale yellow.  Be physically active every day. This includes: ? Aerobic exercise like running or swimming. ? Leisure activities like gardening, walking, or housework.  Get 7-8 hours of sleep each night.  If recommended by your health care provider, drink red wine in moderation. This means 1 glass a day for nonpregnant women and 2 glasses a day for men. A glass of wine equals 5 oz (150 mL). Reading food labels  Check the serving size of packaged foods. For foods such as rice and pasta, the serving size refers to the amount of cooked product, not dry.  Check the total fat in packaged foods. Avoid foods that have saturated fat or trans fats.  Check the ingredients list for added sugars, such as corn syrup. Shopping  At the grocery store, buy most of your food from the areas near the walls of the store. This includes: ? Fresh fruits and vegetables (produce). ? Grains, beans, nuts, and seeds. Some of these may be available in unpackaged forms or large amounts (in bulk). ? Fresh seafood. ? Poultry and eggs. ? Low-fat dairy products.  Buy whole ingredients instead of prepackaged foods.  Buy fresh fruits and vegetables in-season from local farmers markets.  Buy frozen fruits and vegetables in resealable bags.  If you do not have access to quality fresh seafood, buy precooked frozen shrimp or canned fish, such as tuna, salmon, or sardines.  Buy small amounts of raw or cooked vegetables, salads, or olives from  the deli or salad bar at your store.  Stock your pantry so you always have certain foods on hand, such as olive oil, canned tuna, canned tomatoes, rice, pasta, and beans. Cooking  Cook foods  with extra-virgin olive oil instead of using butter or other vegetable oils.  Have meat as a side dish, and have vegetables or grains as your main dish. This means having meat in small portions or adding small amounts of meat to foods like pasta or stew.  Use beans or vegetables instead of meat in common dishes like chili or lasagna.  Experiment with different cooking methods. Try roasting or broiling vegetables instead of steaming or sauteing them.  Add frozen vegetables to soups, stews, pasta, or rice.  Add nuts or seeds for added healthy fat at each meal. You can add these to yogurt, salads, or vegetable dishes.  Marinate fish or vegetables using olive oil, lemon juice, garlic, and fresh herbs. Meal planning  Plan to eat 1 vegetarian meal one day each week. Try to work up to 2 vegetarian meals, if possible.  Eat seafood 2 or more times a week.  Have healthy snacks readily available, such as: ? Vegetable sticks with hummus. ? [place] yogurt. ? Fruit and nut trail mix.  Eat balanced meals throughout the week. This includes: ? Fruit: 2-3 servings a day ? Vegetables: 4-5 servings a day ? Low-fat dairy: 2 servings a day ? Fish, poultry, or lean meat: 1 serving a day ? Beans and legumes: 2 or more servings a week ? Nuts and seeds: 1-2 servings a day ? Whole grains: 6-8 servings a day ? Extra-virgin olive oil: 3-4 servings a day  Limit red meat and sweets to only a few servings a month What are my food choices?  Mediterranean diet ? Recommended ? Grains: Whole-grain pasta. Brown rice. Bulgar wheat. Polenta. Couscous. Whole-wheat bread. [place]. ? Vegetables: Artichokes. Beets. Broccoli. Cabbage. Carrots. Eggplant. Green beans. Chard. Kale. Spinach. Onions. Leeks. Peas. Squash. Tomatoes. Peppers. Radishes. ? Fruits: Apples. Apricots. Avocado. Berries. Bananas. Cherries. Dates. Figs. Grapes. Lemons. Melon. Oranges. Peaches. Plums. Pomegranate. ? Meats and other protein  foods: Beans. Almonds. Sunflower seeds. Pine nuts. Peanuts. [place]. Salmon. Scallops. Shrimp. [place]. Tilapia. Clams. Oysters. Eggs. ? Dairy: Low-fat milk. Cheese. Greek yogurt. ? Beverages: Water. Red wine. Herbal tea. ? Fats and oils: Extra virgin olive oil. Avocado oil. Grape seed oil. ? Sweets and desserts: [place] yogurt with honey. Baked apples. Poached pears. Trail mix. ? Seasoning and other foods: Basil. Cilantro. Coriander. Cumin. Mint. Parsley. Sage. Rosemary. Tarragon. Garlic. Oregano. Thyme. Pepper. Balsalmic vinegar. Tahini. Hummus. Tomato sauce. Olives. Mushrooms. ? Limit these ? Grains: Prepackaged pasta or rice dishes. Prepackaged cereal with added sugar. ? Vegetables: Deep fried potatoes (french fries). ? Fruits: Fruit canned in syrup. ? Meats and other protein foods: Beef. Pork. Lamb. Poultry with skin. Hot dogs. Berniece Salines. ? Dairy: Ice cream. Sour cream. Whole milk. ? Beverages: Juice. Sugar-sweetened soft drinks. Beer. Liquor and spirits. ? Fats and oils: Butter. Canola oil. Vegetable oil. Beef fat (tallow). Lard. ? Sweets and desserts: Cookies. Cakes. Pies. Candy. ? Seasoning and other foods: Mayonnaise. Premade sauces and marinades. ? The items listed may not be a complete list. Talk with your dietitian about what dietary choices are right for you. Summary  The Mediterranean diet includes both food and lifestyle choices.  Eat a variety of fresh fruits and vegetables, beans, nuts, seeds, and whole grains.  Limit the amount of red meat and sweets  that you eat.  Talk with your health care provider about whether it is safe for you to drink red wine in moderation. This means 1 glass a day for nonpregnant women and 2 glasses a day for men. A glass of wine equals 5 oz (150 mL). This information is not intended to replace advice given to you by your health care provider. Make sure you discuss any questions you have with your health care provider. Document Released: 08/21/2015 Document  Revised: 09/23/2015 Document Reviewed: 08/21/2015 Elsevier Interactive Patient Education  2018 [employer].     Signed, Buford Dresser, MD PhD 12/07/2017 11:48 AM    [place]

## 2017-12-07 NOTE — Patient Instructions (Addendum)
Medication Instructions:  Your Physician recommend you continue on your current medication as directed.    If you need a refill on your cardiac medications before your next appointment, please call your pharmacy.   Lab work: None  Testing/Procedures: None  Follow-Up: At [facility], you and your health needs are our priority.  As part of our continuing mission to provide you with exceptional heart care, we have created designated Provider Care Teams.  These Care Teams include your primary Cardiologist (physician) and Advanced Practice Providers (APPs -  Physician Assistants and Nurse Practitioners) who all work together to provide you with the care you need, when you need it. You will need a follow up appointment in 6 months.  Please call our office 2 months in advance to schedule this appointment.  You may see Dr. Lynnae January or one of the following Advanced Practice Providers on your designated Care Team:   Rosaria Ferries, PA-C . Jory Sims, DNP, ANP  Any Other Special Instructions Will Be Listed Below (If Applicable).    Mediterranean Diet A Mediterranean diet refers to food and lifestyle choices that are based on the traditions of countries located on the [employer]. This way of eating has been shown to help prevent certain conditions and improve outcomes for people who have chronic diseases, like kidney disease and heart disease. What are tips for following this plan? Lifestyle  Cook and eat meals together with your family, when possible.  Drink enough fluid to keep your urine clear or pale yellow.  Be physically active every day. This includes: ? Aerobic exercise like running or swimming. ? Leisure activities like gardening, walking, or housework.  Get 7-8 hours of sleep each night.  If recommended by your health care provider, drink red wine in moderation. This means 1 glass a day for nonpregnant women and 2 glasses a day for men. A glass of wine equals 5 oz  (150 mL). Reading food labels  Check the serving size of packaged foods. For foods such as rice and pasta, the serving size refers to the amount of cooked product, not dry.  Check the total fat in packaged foods. Avoid foods that have saturated fat or trans fats.  Check the ingredients list for added sugars, such as corn syrup. Shopping  At the grocery store, buy most of your food from the areas near the walls of the store. This includes: ? Fresh fruits and vegetables (produce). ? Grains, beans, nuts, and seeds. Some of these may be available in unpackaged forms or large amounts (in bulk). ? Fresh seafood. ? Poultry and eggs. ? Low-fat dairy products.  Buy whole ingredients instead of prepackaged foods.  Buy fresh fruits and vegetables in-season from local farmers markets.  Buy frozen fruits and vegetables in resealable bags.  If you do not have access to quality fresh seafood, buy precooked frozen shrimp or canned fish, such as tuna, salmon, or sardines.  Buy small amounts of raw or cooked vegetables, salads, or olives from the deli or salad bar at your store.  Stock your pantry so you always have certain foods on hand, such as olive oil, canned tuna, canned tomatoes, rice, pasta, and beans. Cooking  Cook foods with extra-virgin olive oil instead of using butter or other vegetable oils.  Have meat as a side dish, and have vegetables or grains as your main dish. This means having meat in small portions or adding small amounts of meat to foods like pasta or stew.  Use beans  or vegetables instead of meat in common dishes like chili or lasagna.  Experiment with different cooking methods. Try roasting or broiling vegetables instead of steaming or sauteing them.  Add frozen vegetables to soups, stews, pasta, or rice.  Add nuts or seeds for added healthy fat at each meal. You can add these to yogurt, salads, or vegetable dishes.  Marinate fish or vegetables using olive oil, lemon  juice, garlic, and fresh herbs. Meal planning  Plan to eat 1 vegetarian meal one day each week. Try to work up to 2 vegetarian meals, if possible.  Eat seafood 2 or more times a week.  Have healthy snacks readily available, such as: ? Vegetable sticks with hummus. ? [place] yogurt. ? Fruit and nut trail mix.  Eat balanced meals throughout the week. This includes: ? Fruit: 2-3 servings a day ? Vegetables: 4-5 servings a day ? Low-fat dairy: 2 servings a day ? Fish, poultry, or lean meat: 1 serving a day ? Beans and legumes: 2 or more servings a week ? Nuts and seeds: 1-2 servings a day ? Whole grains: 6-8 servings a day ? Extra-virgin olive oil: 3-4 servings a day  Limit red meat and sweets to only a few servings a month What are my food choices?  Mediterranean diet ? Recommended ? Grains: Whole-grain pasta. Brown rice. Bulgar wheat. Polenta. Couscous. Whole-wheat bread. [place]. ? Vegetables: Artichokes. Beets. Broccoli. Cabbage. Carrots. Eggplant. Green beans. Chard. Kale. Spinach. Onions. Leeks. Peas. Squash. Tomatoes. Peppers. Radishes. ? Fruits: Apples. Apricots. Avocado. Berries. Bananas. Cherries. Dates. Figs. Grapes. Lemons. Melon. Oranges. Peaches. Plums. Pomegranate. ? Meats and other protein foods: Beans. Almonds. Sunflower seeds. Pine nuts. Peanuts. Rooks. Salmon. Scallops. Shrimp. [place]. Tilapia. Clams. Oysters. Eggs. ? Dairy: Low-fat milk. Cheese. Greek yogurt. ? Beverages: Water. Red wine. Herbal tea. ? Fats and oils: Extra virgin olive oil. Avocado oil. Grape seed oil. ? Sweets and desserts: [place] yogurt with honey. Baked apples. Poached pears. Trail mix. ? Seasoning and other foods: Basil. Cilantro. Coriander. Cumin. Mint. Parsley. Sage. Rosemary. Tarragon. Garlic. Oregano. Thyme. Pepper. Balsalmic vinegar. Tahini. Hummus. Tomato sauce. Olives. Mushrooms. ? Limit these ? Grains: Prepackaged pasta or rice dishes. Prepackaged cereal with added  sugar. ? Vegetables: Deep fried potatoes (french fries). ? Fruits: Fruit canned in syrup. ? Meats and other protein foods: Beef. Pork. Lamb. Poultry with skin. Hot dogs. Berniece Salines. ? Dairy: Ice cream. Sour cream. Whole milk. ? Beverages: Juice. Sugar-sweetened soft drinks. Beer. Liquor and spirits. ? Fats and oils: Butter. Canola oil. Vegetable oil. Beef fat (tallow). Lard. ? Sweets and desserts: Cookies. Cakes. Pies. Candy. ? Seasoning and other foods: Mayonnaise. Premade sauces and marinades. ? The items listed may not be a complete list. Talk with your dietitian about what dietary choices are right for you. Summary  The Mediterranean diet includes both food and lifestyle choices.  Eat a variety of fresh fruits and vegetables, beans, nuts, seeds, and whole grains.  Limit the amount of red meat and sweets that you eat.  Talk with your health care provider about whether it is safe for you to drink red wine in moderation. This means 1 glass a day for nonpregnant women and 2 glasses a day for men. A glass of wine equals 5 oz (150 mL). This information is not intended to replace advice given to you by your health care provider. Make sure you discuss any questions you have with your health care provider. Document Released: 08/21/2015 Document Revised: 09/23/2015 Document Reviewed: 08/21/2015  Chartered certified accountant Patient Education  [employer].

## 2017-12-08 ENCOUNTER — Other Ambulatory Visit: Payer: Self-pay | Admitting: Family Medicine

## 2017-12-27 ENCOUNTER — Other Ambulatory Visit: Payer: Self-pay | Admitting: Family Medicine

## 2018-01-05 DIAGNOSIS — Z411 Encounter for cosmetic surgery: Secondary | ICD-10-CM | POA: Diagnosis not present

## 2018-01-05 DIAGNOSIS — Z23 Encounter for immunization: Secondary | ICD-10-CM | POA: Diagnosis not present

## 2018-02-12 ENCOUNTER — Other Ambulatory Visit: Payer: Self-pay | Admitting: Family Medicine

## 2018-03-23 ENCOUNTER — Ambulatory Visit (INDEPENDENT_AMBULATORY_CARE_PROVIDER_SITE_OTHER): Payer: Medicare Other | Admitting: Family Medicine

## 2018-03-23 ENCOUNTER — Other Ambulatory Visit: Payer: Self-pay

## 2018-03-23 VITALS — BP 125/80 | HR 120 | Temp 98.7°F | Wt 181.2 lb

## 2018-03-23 DIAGNOSIS — J069 Acute upper respiratory infection, unspecified: Secondary | ICD-10-CM

## 2018-03-23 MED ORDER — ALBUTEROL SULFATE HFA 108 (90 BASE) MCG/ACT IN AERS: 2.0000 | INHALATION_SPRAY | Freq: Four times a day (QID) | RESPIRATORY_TRACT | 0 refills | Status: DC | PRN

## 2018-03-23 NOTE — Patient Instructions (Signed)
It was great meeting you today!  I think your symptoms are consistent with a viral upper respiratory infection.  If you develop wheezing or having some trouble breathing can take your albuterol as needed.  I sent in a refill of prescription.  You can also take some over-the-counter stuff to help with the cough and congestion.  I recommend Robitussin-DM.  I gave you a handout for this.  In general you are going to leave for over-the-counter medications with guaifenesin and Dextromorphan.  For contains ingredients the cheaper the option is recommended.  Back and see [place] in 1 week if her symptoms do not resolve.  Develop a very high fever, trouble breathing, or increasing shortness of breath please come back and see [place] earlier or go to the emergency department.

## 2018-03-24 ENCOUNTER — Encounter: Payer: Self-pay | Admitting: Family Medicine

## 2018-03-24 NOTE — Assessment & Plan Note (Signed)
Symptoms consistent with viral upper restaurant infection.  No wheezing or lung sounds to indicate COPD exacerbation or pneumonia.  Supportive care, refilled albuterol inhaler to use as needed.

## 2018-03-24 NOTE — Progress Notes (Signed)
   HPI 53 year old female who presents for 3-day history of cough and sinus congestion.  States that she has not been feeling poorly and has not had any fevers.  She states that she was feeling so congested the night before clinic appointment that she had to sit in a chair to sleep.  This positional change did help her symptoms significantly.  Not tried anything over-the-counter to help with her symptoms.  She has been out of albuterol inhaler for some time.  CC: Congestion, cough   ROS:   Review of Systems See HPI for ROS.   CC, SH/smoking status, and VS noted  Objective: BP 125/80   Pulse (!) 120   Temp 98.7 F (37.1 C) (Oral)   Wt 181 lb 3.2 oz (82.2 kg)   SpO2 99%   BMI 26.76 kg/m  Gen: 53 year old African-American female, no acute distress, resting comfortably HEENT: No posterior pharyngeal erythema, no tonsillar exudates, moderate amount of cerumen bilateral ear canal, no bulging tympanic membrane CV: RRR, no murmur Resp: CTAB, no wheezes, non-labored Abd: SNTND, BS present, no guarding or organomegaly Neuro: Alert and oriented, Speech clear, No gross deficits   Assessment and plan:  Viral upper respiratory infection Symptoms consistent with viral upper restaurant infection.  No wheezing or lung sounds to indicate COPD exacerbation or pneumonia.  Supportive care, refilled albuterol inhaler to use as needed.   No orders of the defined types were placed in this encounter.   Meds ordered this encounter  Medications  . albuterol (PROVENTIL HFA;VENTOLIN HFA) 108 (90 Base) MCG/ACT inhaler    Sig: Inhale 2 puffs into the lungs every 6 (six) hours as needed for wheezing.    Dispense:  2 Inhaler    Refill:  0    Guadalupe Dawn MD PGY-2 Family Medicine Resident  03/24/2018 9:44 AM

## 2018-04-03 DIAGNOSIS — M5136 Other intervertebral disc degeneration, lumbar region: Secondary | ICD-10-CM | POA: Diagnosis not present

## 2018-04-12 DIAGNOSIS — M5136 Other intervertebral disc degeneration, lumbar region: Secondary | ICD-10-CM | POA: Diagnosis not present

## 2018-04-20 DIAGNOSIS — M5136 Other intervertebral disc degeneration, lumbar region: Secondary | ICD-10-CM | POA: Diagnosis not present

## 2018-05-09 ENCOUNTER — Telehealth: Payer: Self-pay | Admitting: Cardiology

## 2018-05-09 NOTE — Telephone Encounter (Signed)
Pt offered a f/u virtual visit but declined and state she would rather have an in office visit. Pt scheduled for 8/18 at 120 pm with Dr. Harrell Gave.

## 2018-05-09 NOTE — Telephone Encounter (Signed)
New Message  Patient is returning call that she received. I am not showing any notes but I am pretty sure it was to see about setting up a virtual visit or future appt in August or beyond. Please call patient.

## 2018-05-16 DIAGNOSIS — M5136 Other intervertebral disc degeneration, lumbar region: Secondary | ICD-10-CM | POA: Diagnosis not present

## 2018-06-03 ENCOUNTER — Other Ambulatory Visit: Payer: Self-pay | Admitting: Family Medicine

## 2018-06-17 IMAGING — DX DG CHEST 2V
2 series · 2 of 2 positions shown · non-contrast
Comparison: Prior radiograph from 11/06/2016.

CLINICAL DATA: Initial evaluation for acute right lower chest/
upper abdominal pain for 3 days. Recent pneumonia.

EXAM:
CHEST  2 VIEW

[chest pa]
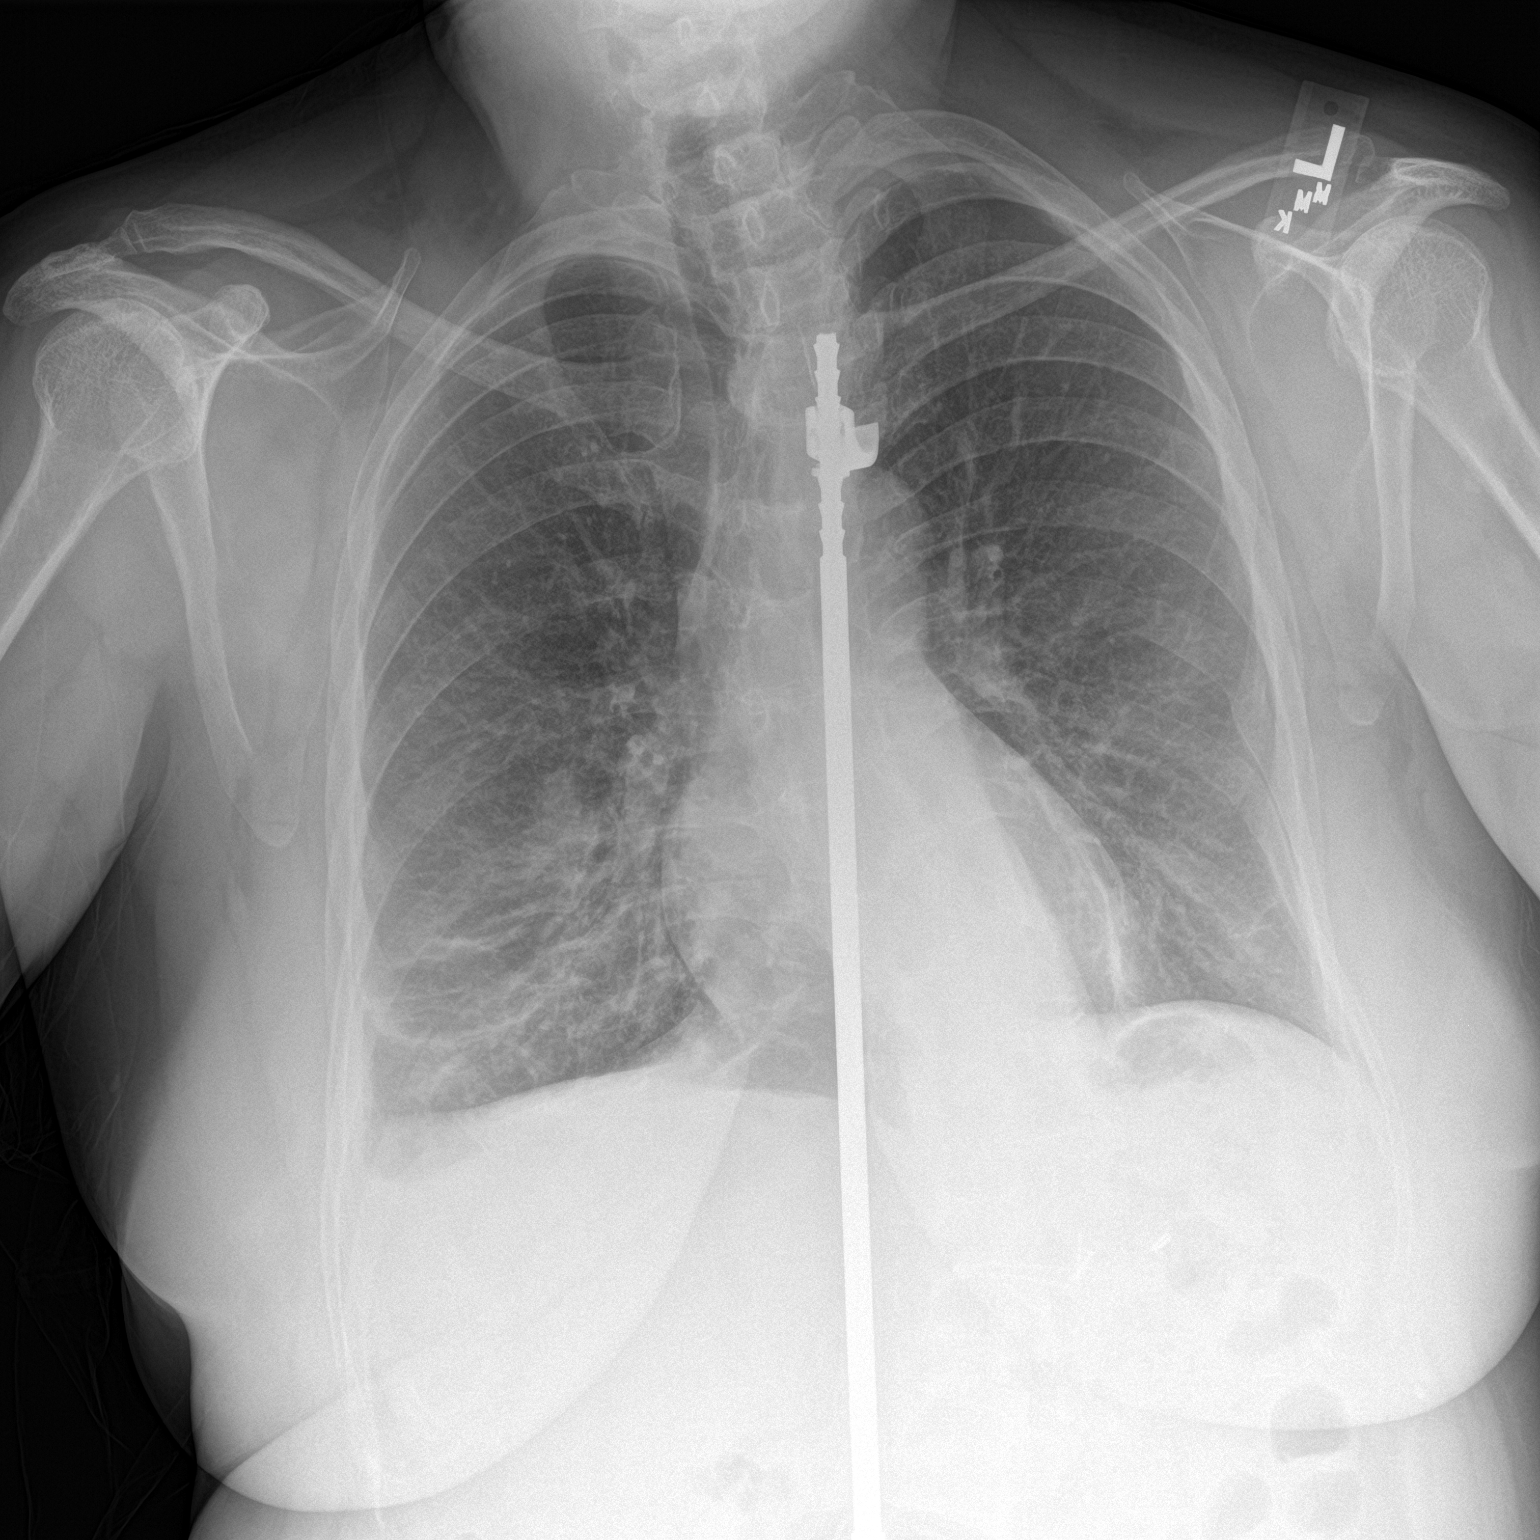

[chest lat]
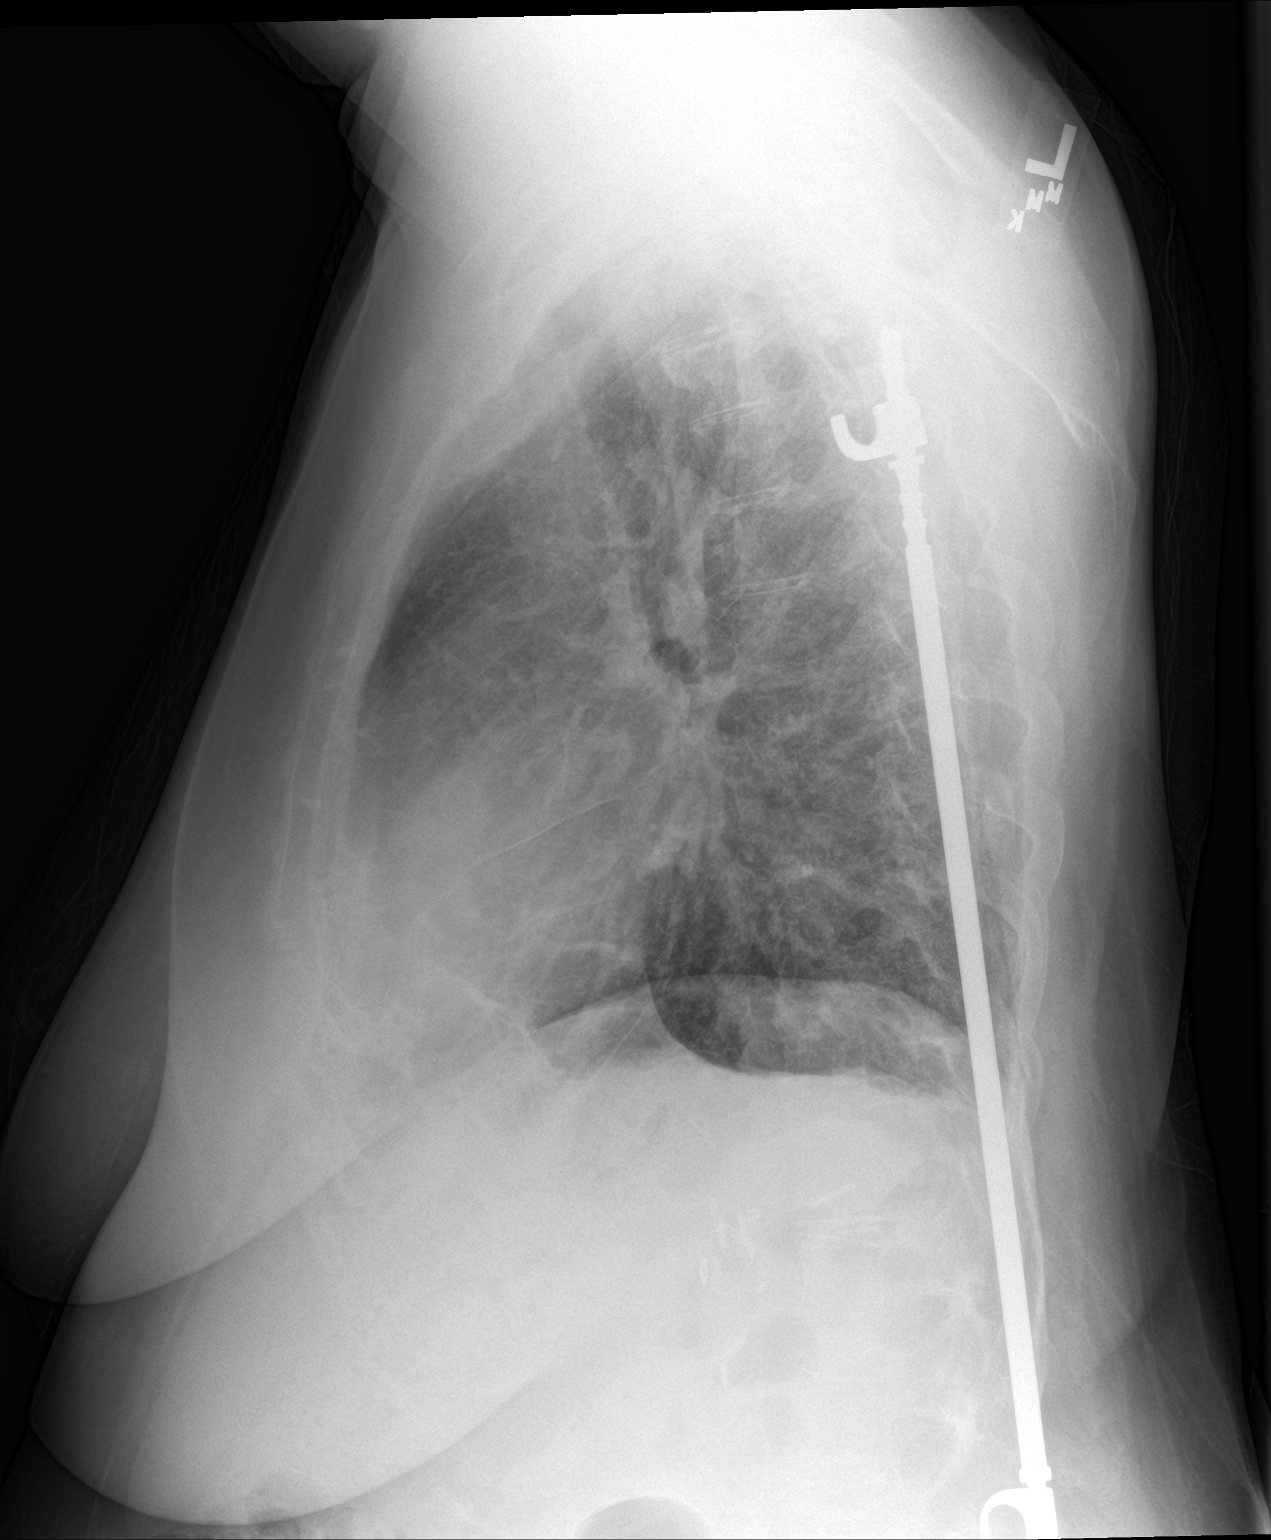

[2 of 2 positions shown; findings below may reference images not displayed]

FINDINGS: Cardiac and mediastinal silhouettes are stable in size and contour,
and remain within normal limits.

Lungs normally inflated. Persistent streaky and patchy opacity at
the right lung base, which may reflect atelectasis and/ or
persistent infiltrate. Finding is overall similar to previous. Small
right pleural effusion. Mild left basilar atelectasis and/ or
scarring. Underlying COPD. No pulmonary edema. No pneumothorax.

No acute osseus abnormality. At remote left-sided rib fractures
noted. Spinal fixation rod in place.
IMPRESSION: 1. Persistent right basilar linear and patchy opacities, similar to
previous exam. While this finding may in part reflect atelectasis
and/or scarring, possible persistent infiltrate could be considered
in the correct clinical setting.
2. Small right pleural effusion.
3. Underlying COPD.

## 2018-06-29 ENCOUNTER — Other Ambulatory Visit: Payer: Self-pay | Admitting: Family Medicine

## 2018-06-29 DIAGNOSIS — Z1231 Encounter for screening mammogram for malignant neoplasm of breast: Secondary | ICD-10-CM

## 2018-08-01 DIAGNOSIS — M5136 Other intervertebral disc degeneration, lumbar region: Secondary | ICD-10-CM | POA: Diagnosis not present

## 2018-08-01 DIAGNOSIS — M5416 Radiculopathy, lumbar region: Secondary | ICD-10-CM | POA: Diagnosis not present

## 2018-08-03 ENCOUNTER — Ambulatory Visit: Payer: Medicare Other

## 2018-08-16 ENCOUNTER — Other Ambulatory Visit: Payer: Self-pay | Admitting: Family Medicine

## 2018-08-16 NOTE — Telephone Encounter (Signed)
Pt called about a refill on her LAMICTAL. Please give pt a call back.

## 2018-08-18 MED ORDER — LAMOTRIGINE 200 MG PO TABS: 200.0000 mg | ORAL_TABLET | Freq: Every day | ORAL | 2 refills | Status: DC

## 2018-08-24 DIAGNOSIS — M5136 Other intervertebral disc degeneration, lumbar region: Secondary | ICD-10-CM | POA: Diagnosis not present

## 2018-08-24 DIAGNOSIS — M5416 Radiculopathy, lumbar region: Secondary | ICD-10-CM | POA: Diagnosis not present

## 2018-08-29 ENCOUNTER — Encounter: Payer: Self-pay | Admitting: Cardiology

## 2018-08-29 ENCOUNTER — Other Ambulatory Visit: Payer: Self-pay

## 2018-08-29 ENCOUNTER — Ambulatory Visit (INDEPENDENT_AMBULATORY_CARE_PROVIDER_SITE_OTHER): Payer: Medicare Other | Admitting: Cardiology

## 2018-08-29 VITALS — BP 138/68 | HR 116 | Temp 97.3°F | Ht 69.0 in | Wt 179.0 lb

## 2018-08-29 DIAGNOSIS — R000 Tachycardia, unspecified: Secondary | ICD-10-CM | POA: Diagnosis not present

## 2018-08-29 DIAGNOSIS — I10 Essential (primary) hypertension: Secondary | ICD-10-CM

## 2018-08-29 DIAGNOSIS — Z7189 Other specified counseling: Secondary | ICD-10-CM

## 2018-08-29 NOTE — Progress Notes (Signed)
Cardiology Office Note:    Date:  08/29/2018   ID:  [name], DOB 29-Aug-1965, MRN [id number]  PCP:  Benay Pike, MD  Cardiologist:  Buford Dresser, MD PhD  Referring MD: Alveda Reasons, MD   CC: follow up  History of Present Illness:    [name] is a 53 y.o. female with a hx of hypertension, sinus tachycardia, history of PFO (per PCP notes), remote PE. Initial consult was 12/07/2017.  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  -Long history of sinus tachycardia, asymptomatic  Patient concerns today:  Caregiver for her sister, who has cervical cancer. Mother just diagnosed with dementia. Daughter just diagnosed with Graves disease. Under a lot of stress. Son is a Equities trader at [employer] and they just closed the campus due to [employer]. Still smoking, has been worse with Covid. Doesn't check BP at home.   Had planned to make a lot of lifestyle changes but then COVID hit. Wants to work on diet and exercise. Discussed recommendations for this today.   Denies chest pain, shortness of breath at rest or with normal exertion. No PND, orthopnea, LE edema or unexpected weight gain. No syncope or palpitations.  Past Medical History:  Diagnosis Date  . Bipolar 1 disorder ([place])   . Chronic kidney disease    donated left kidney  . Hypertension   . Pneumonia 2010   had 2 episodes/ was sent home on O2  . Pulmonary embolism ([place]) 1994   Only 1 clot, not on lifelong anticoagulation    Past Surgical History:  Procedure Laterality Date  . APPENDECTOMY  2001  . Endometriosis s/p left fallopian tube removal.  2001  . 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 TONSILLECTOMY      Current Medications: Current Outpatient Medications on File Prior to Visit  Medication Sig  . albuterol (PROVENTIL HFA;VENTOLIN HFA) 108 (90 Base) MCG/ACT inhaler Inhale 2 puffs into the lungs every 6 (six) hours as needed for wheezing.  .  hydrochlorothiazide (MICROZIDE) 12.5 MG capsule TAKE 1 CAPSULE (12.5 MG TOTAL) BY MOUTH DAILY.  Marland Kitchen lamoTRIgine (LAMICTAL) 200 MG tablet Take 1 tablet (200 mg total) by mouth daily.  . traZODone (DESYREL) 100 MG tablet TAKE 1 TABLET BY MOUTH EVERYDAY AT BEDTIME   Current Facility-Administered Medications on File Prior to Visit  Medication  . 0.9 %  sodium chloride infusion     Allergies:   Iohexol, Morphine and related, and Sulfa antibiotics   Social History   Socioeconomic History  . Marital status: Divorced    Spouse name: Not on file  . Number of children: Not on file  . Years of education: Not on file  . Highest education level: Not on file  Occupational History  . Not on file  Social Needs  . Financial resource strain: Not on file  . Food insecurity    Worry: Not on file    Inability: Not on file  . Transportation needs    Medical: Not on file    Non-medical: Not on file  Tobacco Use  . Smoking status: Current Every Day Smoker    Packs/day: 0.50    Years: 20.00    Pack years: 10.00    Types: Cigarettes  . Smokeless tobacco: Never Used  Substance and Sexual Activity  . Alcohol use: No    Alcohol/week: 0.0 standard drinks  . Drug use: No  . Sexual activity:  Yes    Birth control/protection: Condom    Comment: Partner with Vasectomy  Lifestyle  . Physical activity    Days per week: Not on file    Minutes per session: Not on file  . Stress: Not on file  Relationships  . Social Herbalist on phone: Not on file    Gets together: Not on file    Attends religious service: Not on file    Active member of club or organization: Not on file    Attends meetings of clubs or organizations: Not on file    Relationship status: Not on file  Other Topics Concern  . Not on file  Social History Narrative  . Not on file     Family History: The patient's family history includes Breast cancer in her maternal grandmother and mother; Colon cancer in her paternal  grandmother; Diabetes in her sister; Kidney disease in her sister.  ROS:   Please see the history of present illness.  Additional pertinent ROS: Constitutional: Negative for chills, fever, night sweats, unintentional weight loss  HENT: Negative for ear pain and hearing loss.   Eyes: Negative for loss of vision and eye pain.  Respiratory: Negative for cough, sputum, wheezing.   Cardiovascular: See HPI. Gastrointestinal: Negative for abdominal pain, melena, and hematochezia.  Genitourinary: Negative for dysuria and hematuria.  Musculoskeletal: Negative for falls and myalgias.  Skin: Negative for itching and rash.  Neurological: Negative for focal weakness, focal sensory changes and loss of consciousness.  Endo/Heme/Allergies: Does not bruise/bleed easily.    EKGs/Labs/Other Studies Reviewed:    The following studies were reviewed today: Echo 12/07/2016 Study Conclusions - Left ventricle: The cavity size was normal. Wall thickness was   normal. The estimated ejection fraction was 60%. Wall motion was   normal; there were no regional wall motion abnormalities. Doppler   parameters are consistent with abnormal left ventricular   relaxation (grade 1 diastolic dysfunction). - Aortic valve: There was no stenosis. - Mitral valve: There was trivial regurgitation. - Right ventricle: The cavity size was normal. Systolic function   was normal. - Pulmonary arteries: No complete TR doppler jet so unable to   estimate PA systolic pressure. - Inferior vena cava: The vessel was normal in size. The   respirophasic diameter changes were in the normal range (>= 50%),   consistent with normal central venous pressure.  Impressions: - Normal LV size with EF 60%. Normal RV size and systolic function.   No significant valvular abnormalities.  EKG:  EKG is personally reviewed.  The ekg ordered today demonstrates sinus tachycardia with a rate of 110 bpm  Recent Labs: 11/22/2017: ALT 10; BUN 17;  Creatinine, Ser 1.06; Hemoglobin 14.2; Platelets 415; Potassium 4.6; Sodium 142; TSH 2.030  Recent Lipid Panel    Component Value Date/Time   CHOL 194 11/22/2017 1054   TRIG 112 11/22/2017 1054   HDL 54 11/22/2017 1054   CHOLHDL 3.6 11/22/2017 1054   CHOLHDL 3.6 11/25/2015 1408   VLDL 29 11/25/2015 1408   LDLCALC 118 (H) 11/22/2017 1054    Physical Exam:    VS:  BP 138/68   Pulse (!) 116   Temp (!) 97.3 F (36.3 C)   Ht 5\' 9"  (1.753 m)   Wt 179 lb (81.2 kg)   SpO2 99%   BMI 26.43 kg/m     Wt Readings from Last 3 Encounters:  08/29/18 179 lb (81.2 kg)  03/23/18 181 lb 3.2 oz (82.2 kg)  12/07/17 175 lb (79.4 kg)    GEN: Well nourished, well developed in no acute distress HEENT: Normal, moist mucous membranes NECK: No JVD CARDIAC: tachycardic, regular rhythm, normal S1 and S2, no murmurs, rubs, gallops.  VASCULAR: Radial and DP pulses 2+ bilaterally. No carotid bruits RESPIRATORY:  Clear to auscultation without rales, wheezing or rhonchi  ABDOMEN: Soft, non-tender, non-distended MUSCULOSKELETAL:  Ambulates independently SKIN: Warm and dry, no edema NEUROLOGIC:  Alert and oriented x 3. No focal neuro deficits noted. PSYCHIATRIC:  Normal affect   ASSESSMENT:    1. Sinus tachycardia   2. Essential hypertension   3. Cardiac risk counseling   4. Counseling on health promotion and disease prevention    PLAN:    Sinus tachycardia: no palpitations, appears long standing on review of the chart. Likely impacted by tobacco use, caffeine, low cardiovascular conditioning level, and perhaps her personal baseline. -daughter recently diagnosed with Grave's disease. Her prior thyroid testing had been normal in 11/2017 -spent time discussing factors involved with balancing heart rate (sympathetic/parasympathetic tone).  -she plans to increase her exercise and see if this changes her HR -last thyroid testing I see was normal, but recommend she discuss with her PCP now that daughter  diagnosed with Grave's disease if any repeat testing indicated.   Hypertension: Has been well controlled. Slightly above goal of <130/80 today. -continue current meds. Expect to improve with lifestyle change. -counseled on how to check blood pressure:  -sit comfortably in a chair, feet uncrossed and flat on floor, for 5-10 minutes  -arm ideally should rest at the level of the heart. However, arm should be relaxed and not tense (for example, do not hold the arm up unsupported)  -avoid exercise, caffeine, and tobacco for at least 30 minutes prior to BP reading  -don't take BP cuff reading over clothes (always place on skin directly)  -I prefer to know how well the medication is working, so I would like you to take your readings 1-2 hours after taking your blood pressure medication if possible  Tobacco abuse, counseled to quit: The patient was counseled on tobacco cessation today for 4 minutes.  Counseling included reviewing the risks of smoking tobacco products, how it impacts the patient's current medical diagnoses and different strategies for quitting.  Pharmacotherapy to aid in tobacco cessation was not prescribed today.  Primary prevention and heath counseling: -recommend heart healthy/Mediterranean diet, with whole grains, fruits, vegetable, fish, lean meats, nuts, and olive oil. Limit salt. -recommend moderate walking, 3-5 times/week for 30-50 minutes each session. Aim for at least 150 minutes.week. Goal should be pace of 3 miles/hours, or walking 1.5 miles in 30 minutes -recommend avoidance of tobacco products. Avoid excess alcohol. -Additional risk factor control:  -Diabetes: A1c is not recently available, no diabetes diagnosis  -Lipids: LDL 118  -Blood pressure control: as above  -Weight: BMI 26 -ASCVD risk score: The 10-year ASCVD risk score Mikey Bussing DC Brooke Bonito., et al., 2013) is: 11.5%   Values used to calculate the score:     Age: 33 years     Sex: Female     Is Non-Hispanic African  American: Yes     Diabetic: No     Tobacco smoker: Yes     Systolic Blood Pressure: 573 mmHg     Is BP treated: Yes     HDL Cholesterol: 54 mg/dL     Total Cholesterol: 194 mg/dL   Plan for follow up: 12 mos or sooner PRN  TIME SPENT WITH PATIENT:  27 minutes of direct patient care. More than 50% of that time was spent on coordination of care and counseling regarding sinus tachycardia, mechanism as well as exercise recommendations.  Buford Dresser, MD, PhD [place]  [facility]   Medication Adjustments/Labs and Tests Ordered: Current medicines are reviewed at length with the patient today.  Concerns regarding medicines are outlined above.  Orders Placed This Encounter  Procedures  . EKG 12-Lead   No orders of the defined types were placed in this encounter.   Patient Instructions  Medication Instructions:  Your Physician recommend you continue on your current medication as directed.    If you need a refill on your cardiac medications before your next appointment, please call your pharmacy.   Lab work: None  Testing/Procedures: None  Follow-Up: At [facility], you and your health needs are our priority.  As part of our continuing mission to provide you with exceptional heart care, we have created designated Provider Care Teams.  These Care Teams include your primary Cardiologist (physician) and Advanced Practice Providers (APPs -  Physician Assistants and Nurse Practitioners) who all work together to provide you with the care you need, when you need it. You will need a follow up appointment in 1 years.  Please call our office 2 months in advance to schedule this appointment.  You may see Buford Dresser, MD or one of the following Advanced Practice Providers on your designated Care Team:   Rosaria Ferries, PA-C . Jory Sims, DNP, ANP        Signed, Buford Dresser, MD PhD 08/29/2018 2:03 PM    [place]

## 2018-08-29 NOTE — Patient Instructions (Signed)

## 2018-09-01 ENCOUNTER — Other Ambulatory Visit: Payer: Self-pay

## 2018-09-02 MED ORDER — HYDROCHLOROTHIAZIDE 12.5 MG PO CAPS: 12.5000 mg | ORAL_CAPSULE | Freq: Every day | ORAL | 2 refills | Status: DC

## 2018-09-04 ENCOUNTER — Other Ambulatory Visit: Payer: Self-pay

## 2018-09-04 ENCOUNTER — Ambulatory Visit
Admission: RE | Admit: 2018-09-04 | Discharge: 2018-09-04 | Disposition: A | Discharge status: Home / Self Care | Payer: Medicare Other | Source: Ambulatory Visit | Attending: Obstetrics and Gynecology | Admitting: Obstetrics and Gynecology

## 2018-09-04 DIAGNOSIS — Z1231 Encounter for screening mammogram for malignant neoplasm of breast: Secondary | ICD-10-CM

## 2018-10-11 DIAGNOSIS — Z23 Encounter for immunization: Secondary | ICD-10-CM | POA: Diagnosis not present

## 2018-10-12 ENCOUNTER — Ambulatory Visit: Payer: Medicare Other

## 2018-12-01 ENCOUNTER — Ambulatory Visit (INDEPENDENT_AMBULATORY_CARE_PROVIDER_SITE_OTHER): Payer: Medicare Other | Admitting: Family Medicine

## 2018-12-01 ENCOUNTER — Other Ambulatory Visit: Payer: Self-pay

## 2018-12-01 ENCOUNTER — Encounter: Payer: Self-pay | Admitting: Family Medicine

## 2018-12-01 VITALS — BP 128/78 | HR 115 | Ht 69.0 in | Wt 180.6 lb

## 2018-12-01 DIAGNOSIS — E663 Overweight: Secondary | ICD-10-CM

## 2018-12-01 DIAGNOSIS — Z720 Tobacco use: Secondary | ICD-10-CM

## 2018-12-01 DIAGNOSIS — R000 Tachycardia, unspecified: Secondary | ICD-10-CM

## 2018-12-01 DIAGNOSIS — I10 Essential (primary) hypertension: Secondary | ICD-10-CM

## 2018-12-01 MED ORDER — NICOTINE 14 MG/24HR TD PT24: 14.0000 mg | MEDICATED_PATCH | Freq: Every day | TRANSDERMAL | 0 refills | Status: DC

## 2018-12-01 MED ORDER — NICOTINE POLACRILEX 2 MG MT GUM: 2.0000 mg | CHEWING_GUM | OROMUCOSAL | 0 refills | Status: DC | PRN

## 2018-12-01 NOTE — Patient Instructions (Addendum)
It was great to meet you today,  I will get a BMP today to check your electrolyte levels given that you are on hydrochlorothiazide.  I have prescribed you nicotine patch and gum, which you can use in combination to help curb your smoking.  You can also get 1 month free supply of a nicotine replacement therapy if you call 1-800-quit-now hotline and give them some information.  I will follow-up with our referral coordinator about your son sleep study.  Have a great day,  Clemetine Marker, MD

## 2018-12-01 NOTE — Progress Notes (Signed)
   [facility] Phone: [phone]     [name] - 53 y.o. female MRN [id number]  Date of birth: [birth year]  Subjective:   cc: Hypertension  HPI:  Hypertension: Patient takes hydrochlorothiazide daily.  Tolerates medication well.  No headaches, no chest pains.  Tachycardia: Patient states she has seen the cardiologist who told her to follow-up in 1 year no further treatment.  Patient does not feel any palpitations.  Social: Patient works as an Passenger transport manager.  She  has a son and daughter.  She is divorced.  Patient rarely drinks.  Patient currently smokes cigarettes.  Patient would like to quit smoking.  Is open to medications to help her quit.  ROS: See HPI for pertinent positives and negatives  Family history reviewed for today's visit. No changes.   Objective:   BP 128/78   Pulse (!) 115   Ht 5\' 9"  (1.753 m)   Wt 180 lb 9.6 oz (81.9 kg)   SpO2 97%   BMI 26.67 kg/m  Gen: Alert and oriented.  No acute distress. HEENT: PERRLA.  EOMI. CV: Tachycardic rate and regular rhythm.  No murmurs Resp: Lungs clear to auscultation bilaterally.  No wheezes.  No crackles. Neuro: Cranial nerves II through XII grossly intact.  Assessment/Plan:   HTN (hypertension) Tolerates her hydrochlorothiazide well.  BP 128/78 today. -Continue medication -BMP to check electrolyte and kidney function.  Sinus tachycardia Has been worked up and  seen the cardiologist for this issue.  Benign sinus tachycardia.  Mildly tachycardic today. -Continue to monitor  Currently attempting to quit smoking Patient desires to quit smoking. -Nicotine patch with nicotine gum. -Gave patient information for smoking cessation hotline   Clemetine Marker, MD PGY-2 [facility]

## 2018-12-02 LAB — BASIC METABOLIC PANEL
BUN/Creatinine Ratio: 16 (ref 9–23)
BUN: 19 mg/dL (ref 6–24)
CO2: 21 mmol/L (ref 20–29)
Calcium: 9.6 mg/dL (ref 8.7–10.2)
Chloride: 101 mmol/L (ref 96–106)
Creatinine, Ser: 1.21 mg/dL — ABNORMAL HIGH (ref 0.57–1.00)
GFR calc Af Amer: 59 mL/min/{1.73_m2} — ABNORMAL LOW (ref >59)
GFR calc non Af Amer: 51 mL/min/{1.73_m2} — ABNORMAL LOW (ref >59)
Glucose: 89 mg/dL (ref 65–99)
Potassium: 4 mmol/L (ref 3.5–5.2)
Sodium: 143 mmol/L (ref 134–144)

## 2018-12-03 NOTE — Assessment & Plan Note (Signed)
Patient desires to quit smoking. -Nicotine patch with nicotine gum. -Gave patient information for smoking cessation hotline

## 2018-12-03 NOTE — Assessment & Plan Note (Signed)
Has been worked up and  seen the cardiologist for this issue.  Benign sinus tachycardia.  Mildly tachycardic today. -Continue to monitor

## 2018-12-03 NOTE — Assessment & Plan Note (Signed)
Tolerates her hydrochlorothiazide well.  BP 128/78 today. -Continue medication -BMP to check electrolyte and kidney function.

## 2018-12-11 ENCOUNTER — Other Ambulatory Visit: Payer: Medicare Other

## 2018-12-11 ENCOUNTER — Other Ambulatory Visit: Payer: Self-pay

## 2018-12-11 ENCOUNTER — Other Ambulatory Visit: Payer: Self-pay | Admitting: Family Medicine

## 2018-12-11 DIAGNOSIS — R7989 Other specified abnormal findings of blood chemistry: Secondary | ICD-10-CM | POA: Diagnosis not present

## 2018-12-11 NOTE — Progress Notes (Signed)
Pt had elevated Creatinine levels on last bmp.  No previous diagnosis of CKD. Will need to check again and get urine test for protein.

## 2018-12-12 LAB — BASIC METABOLIC PANEL
BUN/Creatinine Ratio: 22 (ref 9–23)
BUN: 24 mg/dL (ref 6–24)
CO2: 26 mmol/L (ref 20–29)
Calcium: 10.1 mg/dL (ref 8.7–10.2)
Chloride: 100 mmol/L (ref 96–106)
Creatinine, Ser: 1.07 mg/dL — ABNORMAL HIGH (ref 0.57–1.00)
GFR calc Af Amer: 68 mL/min/{1.73_m2} (ref >59)
GFR calc non Af Amer: 59 mL/min/{1.73_m2} — ABNORMAL LOW (ref >59)
Glucose: 93 mg/dL (ref 65–99)
Potassium: 4 mmol/L (ref 3.5–5.2)
Sodium: 142 mmol/L (ref 134–144)

## 2018-12-13 ENCOUNTER — Telehealth: Payer: Self-pay | Admitting: *Deleted

## 2018-12-13 ENCOUNTER — Other Ambulatory Visit: Payer: Self-pay | Admitting: Family Medicine

## 2018-12-13 LAB — MICROALBUMIN / CREATININE URINE RATIO
Creatinine, Urine: 27.1 mg/dL
Microalb/Creat Ratio: 1722 mg/g creat — ABNORMAL HIGH (ref 0–29)
Microalbumin, Urine: 466.7 ug/mL

## 2018-12-13 MED ORDER — LOSARTAN POTASSIUM 25 MG PO TABS: 25.0000 mg | ORAL_TABLET | Freq: Every day | ORAL | 3 refills | Status: DC

## 2018-12-13 NOTE — Progress Notes (Signed)
Started pt on losartan and stopped hctz d/t CKD-3b diagnosis.

## 2018-12-13 NOTE — Telephone Encounter (Signed)
Pt would like PCP to call her back to discuss her lab work. Christen Bame, CMA

## 2018-12-19 ENCOUNTER — Telehealth: Payer: Self-pay | Admitting: Family Medicine

## 2018-12-19 NOTE — Telephone Encounter (Signed)
Pt is calling to ask if Dr. Jeannine Kitten can call her to discuss her most recent lab work. She received the results on mychart but would like to discuss further.   The best call back number is [phone]

## 2018-12-20 ENCOUNTER — Other Ambulatory Visit: Payer: Self-pay | Admitting: Family Medicine

## 2018-12-20 ENCOUNTER — Telehealth: Payer: Self-pay | Admitting: Family Medicine

## 2018-12-20 DIAGNOSIS — N1831 Chronic kidney disease, stage 3a: Secondary | ICD-10-CM

## 2018-12-20 NOTE — Telephone Encounter (Signed)
Patient called to see if she could receive a call back from her PCP regarding her lab results, please give pt a call back.

## 2018-12-20 NOTE — Telephone Encounter (Signed)
Pt calls FO again.  Is very anxious about results in mychart.  MD paged. Christen Bame, CMA

## 2018-12-20 NOTE — Telephone Encounter (Signed)
Spoke with pt regarding her CKD.  Had a long discussion with patient about what the different lab values mean and why nobody had talked with her prior to this about having CKD. I explained how GFR is often recorded as >60 if the patient has CKD 1 or 2 and it wasn't until it went below 60 that someone picked up on it.  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  She stated she would like a referral to the nephrologist.  I was agreeable to this and sent in a nephrology referral for the patient.

## 2019-01-15 DIAGNOSIS — Z20828 Contact with and (suspected) exposure to other viral communicable diseases: Secondary | ICD-10-CM | POA: Diagnosis not present

## 2019-01-16 ENCOUNTER — Ambulatory Visit: Payer: Medicare Other | Admitting: Family Medicine

## 2019-01-23 ENCOUNTER — Encounter: Payer: Self-pay | Admitting: Family Medicine

## 2019-01-23 ENCOUNTER — Other Ambulatory Visit: Payer: Self-pay

## 2019-01-23 ENCOUNTER — Ambulatory Visit (INDEPENDENT_AMBULATORY_CARE_PROVIDER_SITE_OTHER): Payer: Medicare Other | Admitting: Family Medicine

## 2019-01-23 DIAGNOSIS — I10 Essential (primary) hypertension: Secondary | ICD-10-CM | POA: Diagnosis not present

## 2019-01-23 DIAGNOSIS — N1831 Chronic kidney disease, stage 3a: Secondary | ICD-10-CM | POA: Diagnosis not present

## 2019-01-23 NOTE — Progress Notes (Signed)
   [facility] Phone: [phone]     [name] - 54 y.o. female MRN [id number]  Date of birth: 04/15/1965  Subjective:   cc: HTN, CKD  HPI:  HTN: Patient has been taking her losartan daily.  No issues with swelling, cough.  No symptoms of hypotension.  CKD: Patient has been in contact with the nephrologist.  She states she is on a "waiting list" to see them and that this has reassured her because this means the condition must not be that serious as she thought it was.  ROS: See HPI for pertinent positives and negatives  Family history reviewed for today's visit. No changes.   Objective:   BP (!) 98/58   Pulse (!) 108   Wt 181 lb 12.8 oz (82.5 kg)   SpO2 98%   BMI 26.85 kg/m  Gen: Alert and oriented.  No acute distress.  Appears stated age. CV: Regular rate and rhythm.  No murmurs. Resp: LCTAB.  No wheezes. Psych: Pleasant affect.  Spontaneous speech, makes eye contact.  Assessment/Plan:   HTN (hypertension) Blood pressure low normal today.  Patient not having any symptoms of hypotension, no side effects. -Continue current medication -Advised patient on signs/symptoms of hypotension and angioedema.  Chronic kidney disease (CKD), stage III (moderate) Patient still waiting for initial visit with nephrologist.  She seems to be less concerned about her diagnosis of CKD stage III than she was at prior visit.  Last BMP was less than 2 months ago, no need to repeat right now. -Follow-up after patient has had initial visit with nephrologist.     Clemetine Marker, MD PGY-2 [facility]

## 2019-01-23 NOTE — Patient Instructions (Signed)
It was nice to see you again today,  Your blood pressure looks good.  If you ever start to develop dizziness upon standing, feeling weak then you should check your blood pressure either at home or at the store and then if it is low he can stop taking the medication and call us.  If you ever develop swelling of the tongue or lips you should either come to our clinic immediately or go to an urgent care/ED so that they can treat your swelling before obstructs her airway.  I would like to see back in 3 months so that we can discuss further your kidney function and blood pressure.  By that time I expect that you will have seen the nephrologist.  Have a great day,  Clemetine Marker, MD

## 2019-01-26 DIAGNOSIS — N183 Chronic kidney disease, stage 3 unspecified: Secondary | ICD-10-CM | POA: Insufficient documentation

## 2019-01-26 NOTE — Assessment & Plan Note (Signed)
Patient still waiting for initial visit with nephrologist.  She seems to be less concerned about her diagnosis of CKD stage III than she was at prior visit.  Last BMP was less than 2 months ago, no need to repeat right now. -Follow-up after patient has had initial visit with nephrologist.

## 2019-01-26 NOTE — Assessment & Plan Note (Signed)
Blood pressure low normal today.  Patient not having any symptoms of hypotension, no side effects. -Continue current medication -Advised patient on signs/symptoms of hypotension and angioedema.

## 2019-01-31 ENCOUNTER — Telehealth: Payer: Self-pay

## 2019-01-31 ENCOUNTER — Other Ambulatory Visit: Payer: Self-pay

## 2019-01-31 ENCOUNTER — Ambulatory Visit (INDEPENDENT_AMBULATORY_CARE_PROVIDER_SITE_OTHER): Payer: Medicare Other | Admitting: Family Medicine

## 2019-01-31 ENCOUNTER — Encounter: Payer: Self-pay | Admitting: Family Medicine

## 2019-01-31 VITALS — BP 125/80 | HR 121 | Wt 181.4 lb

## 2019-01-31 DIAGNOSIS — R202 Paresthesia of skin: Secondary | ICD-10-CM | POA: Diagnosis not present

## 2019-01-31 NOTE — Patient Instructions (Signed)
Thank you so much for coming in today.  I do not think the tingling is from an allergic reaction at this time. Please continue to monitor your symptoms.  If you develop any swelling or shortness of breath please report to the ED as soon as possible.  Take care, Dr. Tarry Kos

## 2019-01-31 NOTE — Progress Notes (Signed)
   Subjective:   Patient ID: [name]    DOB: [birth year], 54 y.o. female   MRN: [id number]  [name] is a 54 y.o. female with a history of HTN here for lip swelling  Lip Tingling: Patient notes she has been on Losartan for several months without difficulty. She doesn't think her lips are swollen, but she woke up with some tingleing in her lips. She denies any shortness of breath, no face swelling, throat swelling. Notes she is eating just fine. Denise any facial weakness, slurred speech. Denies any numbness/tingling in any other parts of face. She came in today because she was told to look out for possible lip swelling with her blood pressure medicine.  Denies any recent dental work except having a filling done prior to [employer]. Denies any history of HSV. Denies any history of anaphylaxis to any drugs.   Review of Systems:  Per HPI.   [place], medications and smoking status reviewed.  Objective:   BP 125/80   Pulse (!) 121   Wt 181 lb 6.4 oz (82.3 kg)   SpO2 98%   BMI 26.79 kg/m  Vitals and nursing note reviewed.  General: pleasant older AA lady, sitting comfortably in exam chair, well nourished, well developed, in no acute distress with non-toxic appearance HEENT: normocephalic, atraumatic, oropharynx clear without swelling or erythema, dentition normal, tongue freely mobile, eyes: EOMI without any surrounding orbial edema  Neuro: facial sensation normal and symmetric, no facial drop and normal and symmetric smile,  Neck: supple, normal ROM Resp: Breathing comfortably on room air, speaking in full sentences  Skin: warm, dry MSK:  gait normal  Assessment & Plan:   Concern for Allergic Reaction  Paresthesia of Superior and Inferior Labial oris Patient presents today with new onset tingling of her lips this morning without any associated swelling or respiratory compromise. Patient is overall very well appearing with benign physical exam. No signs of HSV infection.  Dentition/oropharynx without signs for cause. Do not expect angioedema at this time. Recommended patient continue to monitor and to report to ED if she develops any swelling or difficulties with breathing. She voiced understanding and agreement to plan. RTC as needed.  Mina Marble, DO PGY-2, [facility] 01/31/2019 3:14 PM

## 2019-01-31 NOTE — Telephone Encounter (Signed)
Pt calls nurse line with concerns regarding BP medication. Pt states that she has not had any side effects after being on losartan. However, patient reports this morning lips have been tingling. Pt denies throat swelling, itchiness or difficulty breathing or swallowing. Pt states lips may be slightly swollen. No acute distress noted at this time. Pt able to speak in full sentences.   Pt scheduled for afternoon office visit. ED precautions given. Pt verbalized understanding.   To PCP and [facility] (provider seeing pt in office this afternoon)  Talbot Grumbling, RN

## 2019-02-12 ENCOUNTER — Other Ambulatory Visit: Payer: Self-pay | Admitting: Family Medicine

## 2019-02-14 DIAGNOSIS — Z20828 Contact with and (suspected) exposure to other viral communicable diseases: Secondary | ICD-10-CM | POA: Diagnosis not present

## 2019-03-22 ENCOUNTER — Other Ambulatory Visit: Payer: Self-pay | Admitting: Physical Medicine and Rehabilitation

## 2019-03-22 DIAGNOSIS — M542 Cervicalgia: Secondary | ICD-10-CM

## 2019-04-14 ENCOUNTER — Ambulatory Visit
Admission: RE | Admit: 2019-04-14 | Discharge: 2019-04-14 | Disposition: A | Discharge status: Home / Self Care | Payer: Medicare Other | Source: Ambulatory Visit | Attending: Physical Medicine and Rehabilitation | Admitting: Physical Medicine and Rehabilitation

## 2019-04-14 ENCOUNTER — Other Ambulatory Visit: Payer: Self-pay

## 2019-04-14 DIAGNOSIS — M4802 Spinal stenosis, cervical region: Secondary | ICD-10-CM | POA: Diagnosis not present

## 2019-04-14 DIAGNOSIS — M542 Cervicalgia: Secondary | ICD-10-CM

## 2019-04-17 DIAGNOSIS — M4802 Spinal stenosis, cervical region: Secondary | ICD-10-CM | POA: Insufficient documentation

## 2019-04-21 ENCOUNTER — Other Ambulatory Visit: Payer: Self-pay | Admitting: Family Medicine

## 2019-04-23 ENCOUNTER — Other Ambulatory Visit: Payer: Self-pay

## 2019-04-23 MED ORDER — TRAZODONE HCL 100 MG PO TABS: ORAL_TABLET | ORAL | 2 refills | Status: DC

## 2019-05-04 DIAGNOSIS — M5010 Cervical disc disorder with radiculopathy, unspecified cervical region: Secondary | ICD-10-CM | POA: Diagnosis not present

## 2019-05-04 DIAGNOSIS — M5033 Other cervical disc degeneration, cervicothoracic region: Secondary | ICD-10-CM | POA: Diagnosis not present

## 2019-05-04 DIAGNOSIS — M5412 Radiculopathy, cervical region: Secondary | ICD-10-CM | POA: Diagnosis not present

## 2019-05-17 DIAGNOSIS — N182 Chronic kidney disease, stage 2 (mild): Secondary | ICD-10-CM | POA: Diagnosis not present

## 2019-05-17 DIAGNOSIS — N2581 Secondary hyperparathyroidism of renal origin: Secondary | ICD-10-CM | POA: Diagnosis not present

## 2019-05-17 DIAGNOSIS — N1831 Chronic kidney disease, stage 3a: Secondary | ICD-10-CM | POA: Diagnosis not present

## 2019-05-17 DIAGNOSIS — D631 Anemia in chronic kidney disease: Secondary | ICD-10-CM | POA: Diagnosis not present

## 2019-05-17 DIAGNOSIS — I129 Hypertensive chronic kidney disease with stage 1 through stage 4 chronic kidney disease, or unspecified chronic kidney disease: Secondary | ICD-10-CM | POA: Diagnosis not present

## 2019-05-17 DIAGNOSIS — N189 Chronic kidney disease, unspecified: Secondary | ICD-10-CM | POA: Diagnosis not present

## 2019-05-22 ENCOUNTER — Other Ambulatory Visit: Payer: Self-pay | Admitting: Internal Medicine

## 2019-05-22 ENCOUNTER — Ambulatory Visit: Payer: Medicare Other | Admitting: Family Medicine

## 2019-05-22 DIAGNOSIS — N182 Chronic kidney disease, stage 2 (mild): Secondary | ICD-10-CM

## 2019-05-31 ENCOUNTER — Ambulatory Visit
Admission: RE | Admit: 2019-05-31 | Discharge: 2019-05-31 | Disposition: A | Discharge status: Home / Self Care | Payer: Medicare Other | Source: Ambulatory Visit | Attending: Internal Medicine | Admitting: Internal Medicine

## 2019-05-31 DIAGNOSIS — N182 Chronic kidney disease, stage 2 (mild): Secondary | ICD-10-CM | POA: Diagnosis not present

## 2019-07-23 ENCOUNTER — Other Ambulatory Visit: Payer: Self-pay | Admitting: Obstetrics and Gynecology

## 2019-07-23 DIAGNOSIS — Z1231 Encounter for screening mammogram for malignant neoplasm of breast: Secondary | ICD-10-CM

## 2019-08-14 DIAGNOSIS — L819 Disorder of pigmentation, unspecified: Secondary | ICD-10-CM | POA: Diagnosis not present

## 2019-08-14 DIAGNOSIS — L918 Other hypertrophic disorders of the skin: Secondary | ICD-10-CM | POA: Diagnosis not present

## 2019-08-26 ENCOUNTER — Ambulatory Visit (INDEPENDENT_AMBULATORY_CARE_PROVIDER_SITE_OTHER): Payer: Medicare Other

## 2019-08-26 ENCOUNTER — Encounter: Payer: Self-pay | Admitting: Emergency Medicine

## 2019-08-26 ENCOUNTER — Ambulatory Visit
Admission: EM | Admit: 2019-08-26 | Discharge: 2019-08-26 | Disposition: A | Discharge status: Home / Self Care | Payer: Medicare Other | Attending: Emergency Medicine | Admitting: Emergency Medicine

## 2019-08-26 ENCOUNTER — Other Ambulatory Visit: Payer: Self-pay

## 2019-08-26 DIAGNOSIS — W1830XA Fall on same level, unspecified, initial encounter: Secondary | ICD-10-CM

## 2019-08-26 DIAGNOSIS — M25561 Pain in right knee: Secondary | ICD-10-CM | POA: Diagnosis not present

## 2019-08-26 DIAGNOSIS — S60419A Abrasion of unspecified finger, initial encounter: Secondary | ICD-10-CM

## 2019-08-26 DIAGNOSIS — S8991XA Unspecified injury of right lower leg, initial encounter: Secondary | ICD-10-CM | POA: Diagnosis not present

## 2019-08-26 DIAGNOSIS — S80211A Abrasion, right knee, initial encounter: Secondary | ICD-10-CM | POA: Diagnosis not present

## 2019-08-26 DIAGNOSIS — W19XXXA Unspecified fall, initial encounter: Secondary | ICD-10-CM | POA: Diagnosis not present

## 2019-08-26 NOTE — ED Triage Notes (Signed)
Pt here for trip and fall today while walking; pt with abrasion to right palm and right knee

## 2019-08-26 NOTE — Discharge Instructions (Addendum)
RICE: rest, ice, compression, elevation as needed for pain.    Heat therapy (hot compress, warm wash rag, hot showers, etc.) can help relax muscles and soothe muscle aches. Cold therapy (ice packs) can be used to help swelling both after injury and after prolonged use of areas of chronic pain/aches.  Pain medication:  350 mg-1000 mg of Tylenol (acetaminophen) and/or 200 mg - 800 mg of Advil (ibuprofen, Motrin) every 8 hours as needed.  May alternate between the two throughout the day as they are generally safe to take together.  DO NOT exceed more than 3000 mg of Tylenol or 3200 mg of ibuprofen in a 24 hour period as this could damage your stomach, kidneys, liver, or increase your bleeding risk.   Important to follow up with specialist(s) below for further evaluation/management if your symptoms persist or worsen. 

## 2019-08-26 NOTE — ED Provider Notes (Signed)
[facility]    CSN: [id number] Arrival date & time: 08/26/19  1231      History   Chief Complaint Chief Complaint  Patient presents with  . Fall    HPI [name] is a 54 y.o. female  With history of hypertension, obesity presenting for evaluation s/p fall.  States that she tripped while walking earlier today: Did have minor head trauma (abrasion) without LOC.  Notes abrasion to right palm and right knee.  Patient having difficulty bearing weight on right knee second pain.  Notes swelling and decreased ROM.  Requesting knee x-ray.  Past Medical History:  Diagnosis Date  . Bipolar 1 disorder ([place])   . Chronic kidney disease    donated left kidney  . Hypertension   . Pneumonia 2010   had 2 episodes/ was sent home on O2  . Pulmonary embolism ([place]) 1994   Only 1 clot, not on lifelong anticoagulation    Patient Active Problem List   Diagnosis Date Noted  . Chronic kidney disease (CKD), stage III (moderate) 01/26/2019  . Thoracic back pain 10/21/2017  . Undiagnosed cardiac murmurs 07/12/2016  . HTN (hypertension) 06/06/2014  . Currently attempting to quit smoking 06/16/2011  . Bipolar 1 disorder ([place]) 08/17/2010  . 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 APPENDECTOMY  2001  . Endometriosis s/p left fallopian tube removal.  2001  . 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 TONSILLECTOMY      OB History   No obstetric history on file.      Home Medications    Prior to Admission medications   Medication Sig Start Date End Date Taking? Authorizing Provider  albuterol (VENTOLIN HFA) 108 (90 Base) MCG/ACT inhaler TAKE 2 PUFFS BY MOUTH EVERY 6 HOURS AS NEEDED FOR WHEEZE 02/12/19   Benay Pike, MD  lamoTRIgine (LAMICTAL) 200 MG tablet TAKE 1 TABLET BY MOUTH EVERY DAY 04/23/19   Mullis, Kiersten P, DO  losartan (COZAAR) 25 MG tablet Take 1 tablet (25 mg total) by mouth at bedtime. 12/13/18    Benay Pike, MD  nicotine (NICODERM CQ - DOSED IN MG/24 HOURS) 14 mg/24hr patch Place 1 patch (14 mg total) onto the skin daily. 12/01/18   Benay Pike, MD  nicotine polacrilex (NICORETTE) 2 MG gum Take 1 each (2 mg total) by mouth as needed for smoking cessation. 12/01/18   Benay Pike, MD  traZODone (DESYREL) 100 MG tablet TAKE 1 TABLET BY MOUTH EVERYDAY AT BEDTIME 04/23/19   Mullis, Archie Endo, DO    Family History Family History  Problem Relation Age of Onset  . Breast cancer Mother   . Breast cancer Maternal Grandmother   . Diabetes Sister   . Kidney disease Sister   . Colon cancer Paternal Grandmother     Social History Social History   Tobacco Use  . Smoking status: Current Every Day Smoker    Packs/day: 0.50    Years: 20.00    Pack years: 10.00    Types: Cigarettes  . Smokeless tobacco: Never Used  Vaping Use  . Vaping Use: Some days  Substance Use Topics  . Alcohol use: No    Alcohol/week: 0.0 standard drinks  . Drug use: No     Allergies   Iohexol, Morphine and related, and Sulfa antibiotics   Review of Systems As per HPI   Physical Exam Triage Vital Signs ED  Triage Vitals  Enc Vitals Group     BP 08/26/19 1322 116/64     Pulse Rate 08/26/19 1322 83     Resp 08/26/19 1322 18     Temp 08/26/19 1322 98.6 F (37 C)     Temp Source 08/26/19 1322 Oral     SpO2 08/26/19 1322 99 %     Weight --      Height --      Head Circumference --      Peak Flow --      Pain Score 08/26/19 1323 7     Pain Loc --      Pain Edu? --      Excl. in [place]? --    No data found.  Updated Vital Signs BP 116/64 (BP Location: Left Arm)   Pulse 83   Temp 98.6 F (37 C) (Oral)   Resp 18   SpO2 99%   Visual Acuity Right Eye Distance:   Left Eye Distance:   Bilateral Distance:    Right Eye Near:   Left Eye Near:    Bilateral Near:     Physical Exam Constitutional:      General: She is not in acute distress. HENT:     Head: Normocephalic and  atraumatic.  Eyes:     General: No scleral icterus.    Pupils: Pupils are equal, round, and reactive to light.  Cardiovascular:     Rate and Rhythm: Normal rate.  Pulmonary:     Effort: Pulmonary effort is normal.  Musculoskeletal:        General: Swelling and tenderness present.     Comments: Patient with mild swelling and exquisite TTP of right kneecap.  Exam limited.  NVI  Skin:    Coloration: Skin is not jaundiced or pale.     Comments: Superficial abrasion to right palm, right knee.  No foreign body or contamination.  Patient was able to irrigate at home  Neurological:     General: No focal deficit present.     Mental Status: She is alert and oriented to person, place, and time.      UC Treatments / Results  Labs (all labs ordered are listed, but only abnormal results are displayed) Labs Reviewed - No data to display  EKG   Radiology DG Knee AP/LAT W/Sunrise Right  Result Date: 08/26/2019 CLINICAL DATA:  Patient with knee pain status post fall. EXAM: RIGHT KNEE 3 VIEWS COMPARISON:  None. FINDINGS: Normal anatomic alignment. No evidence for acute fracture or dislocation. Regional soft tissues unremarkable. IMPRESSION: No acute osseous abnormality. Electronically Signed   By: Lovey Newcomer M.D.   On: 08/26/2019 14:57    Procedures Procedures (including critical care time)  Medications Ordered in UC Medications - No data to display  Initial Impression / Assessment and Plan / UC Course  I have reviewed the triage vital signs and the nursing notes.  Pertinent labs & imaging results that were available during my care of the patient were reviewed by me and considered in my medical decision making (see chart for details).     Right knee x-ray done office, reviewed by me radiology: Negative for acute osseous abnormality.  Reviewed supportive care as outlined below.  Return precautions discussed, pt verbalized understanding and is agreeable to plan. Final Clinical  Impressions(s) / UC Diagnoses   Final diagnoses:  Abrasion of finger of right hand, initial encounter  Abrasion of right knee, initial encounter  Fall, initial encounter  Discharge Instructions     RICE: rest, ice, compression, elevation as needed for pain.    Heat therapy (hot compress, warm wash rag, hot showers, etc.) can help relax muscles and soothe muscle aches. Cold therapy (ice packs) can be used to help swelling both after injury and after prolonged use of areas of chronic pain/aches.  Pain medication:  350 mg-1000 mg of Tylenol (acetaminophen) and/or 200 mg - 800 mg of Advil (ibuprofen, Motrin) every 8 hours as needed.  May alternate between the two throughout the day as they are generally safe to take together.  DO NOT exceed more than 3000 mg of Tylenol or 3200 mg of ibuprofen in a 24 hour period as this could damage your stomach, kidneys, liver, or increase your bleeding risk.  Important to follow up with specialist(s) below for further evaluation/management if your symptoms persist or worsen.    ED Prescriptions    None     PDMP not reviewed this encounter.   [place], [place], [place] 08/26/19 1554

## 2019-08-27 DIAGNOSIS — N2581 Secondary hyperparathyroidism of renal origin: Secondary | ICD-10-CM | POA: Diagnosis not present

## 2019-08-27 DIAGNOSIS — N182 Chronic kidney disease, stage 2 (mild): Secondary | ICD-10-CM | POA: Diagnosis not present

## 2019-08-31 DIAGNOSIS — Z23 Encounter for immunization: Secondary | ICD-10-CM | POA: Diagnosis not present

## 2019-09-04 DIAGNOSIS — N182 Chronic kidney disease, stage 2 (mild): Secondary | ICD-10-CM | POA: Diagnosis not present

## 2019-09-04 DIAGNOSIS — D631 Anemia in chronic kidney disease: Secondary | ICD-10-CM | POA: Diagnosis not present

## 2019-09-04 DIAGNOSIS — I129 Hypertensive chronic kidney disease with stage 1 through stage 4 chronic kidney disease, or unspecified chronic kidney disease: Secondary | ICD-10-CM | POA: Diagnosis not present

## 2019-09-04 DIAGNOSIS — N2581 Secondary hyperparathyroidism of renal origin: Secondary | ICD-10-CM | POA: Diagnosis not present

## 2019-09-05 ENCOUNTER — Other Ambulatory Visit: Payer: Self-pay

## 2019-09-05 ENCOUNTER — Ambulatory Visit
Admission: RE | Admit: 2019-09-05 | Discharge: 2019-09-05 | Disposition: A | Discharge status: Home / Self Care | Payer: Medicare Other | Source: Ambulatory Visit | Attending: Obstetrics and Gynecology | Admitting: Obstetrics and Gynecology

## 2019-09-05 DIAGNOSIS — Z1231 Encounter for screening mammogram for malignant neoplasm of breast: Secondary | ICD-10-CM | POA: Diagnosis not present

## 2019-09-08 DIAGNOSIS — M25552 Pain in left hip: Secondary | ICD-10-CM | POA: Diagnosis not present

## 2019-09-13 ENCOUNTER — Encounter: Payer: Self-pay | Admitting: Cardiology

## 2019-09-13 ENCOUNTER — Other Ambulatory Visit: Payer: Self-pay

## 2019-09-13 ENCOUNTER — Ambulatory Visit (INDEPENDENT_AMBULATORY_CARE_PROVIDER_SITE_OTHER): Payer: Medicare Other | Admitting: Cardiology

## 2019-09-13 VITALS — BP 132/70 | HR 100 | Ht 69.0 in | Wt 151.8 lb

## 2019-09-13 DIAGNOSIS — Z716 Tobacco abuse counseling: Secondary | ICD-10-CM | POA: Diagnosis not present

## 2019-09-13 DIAGNOSIS — I10 Essential (primary) hypertension: Secondary | ICD-10-CM

## 2019-09-13 DIAGNOSIS — F17210 Nicotine dependence, cigarettes, uncomplicated: Secondary | ICD-10-CM

## 2019-09-13 DIAGNOSIS — Z7189 Other specified counseling: Secondary | ICD-10-CM | POA: Diagnosis not present

## 2019-09-13 NOTE — Progress Notes (Signed)
Cardiology Office Note:    Date:  09/13/2019   ID:  [name], DOB 1965-06-01, MRN [id number]  PCP:  Danna Hefty, DO  Cardiologist:  Buford Dresser, MD PhD  Referring MD: Danna Hefty, DO   CC: follow up  History of Present Illness:    [name] is a 54 y.o. female with a hx of hypertension, sinus tachycardia, history of PFO (per PCP notes), remote PE. Initial consult was 12/07/2017.  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  -Long history of sinus tachycardia, asymptomatic  Today: Saw Dr. Posey Pronto at [facility], as she was told that she has CKD (though she has donated a kidney to her sister). Tolerating losartan, blood pressure well controlled .   Has been getting 10,000 steps per day, has lost 20 lbs since starting walking. Has worked on diet as well. Has lost about 30 lbs by our scale.  Still smoking, working on quitting. Down from her peak of 1 ppd to 1/2 to 3/4 ppd. Called the quit line, hoping to get patches to help quit because the over the counter cost is so high.  Denies chest pain, shortness of breath at rest or with normal exertion. No PND, orthopnea, LE edema or unexpected weight gain. No syncope or palpitations.  Past Medical History:  Diagnosis Date   Bipolar 1 disorder ([place])    Chronic kidney disease    donated left kidney   Hypertension    Pneumonia 2010   had 2 episodes/ was sent home on O2   Pulmonary embolism ([place]) 1994   Only 1 clot, not on lifelong anticoagulation    Past Surgical History:  Procedure Laterality Date   APPENDECTOMY  2001   Endometriosis s/p left fallopian tube removal.  2001   NEPHRECTOMY LIVING DONOR  1993   Donated kidney to sister   TONSILLECTOMY      Current Medications: Current Outpatient Medications on File Prior to Visit  Medication Sig   albuterol (VENTOLIN HFA) 108 (90 Base) MCG/ACT inhaler TAKE 2 PUFFS BY MOUTH EVERY 6 HOURS AS NEEDED  FOR WHEEZE   HYDROcodone-acetaminophen (NORCO/VICODIN) 5-325 MG tablet hydrocodone 5 mg-acetaminophen 325 mg tablet  TAKE 1 TABLET 3 TIMES A DAY BY ORAL ROUTE AS NEEDED.   lamoTRIgine (LAMICTAL) 200 MG tablet TAKE 1 TABLET BY MOUTH EVERY DAY   losartan (COZAAR) 25 MG tablet Take 1 tablet (25 mg total) by mouth at bedtime.   nicotine (NICODERM CQ - DOSED IN MG/24 HOURS) 14 mg/24hr patch Place 1 patch (14 mg total) onto the skin daily.   nicotine polacrilex (NICORETTE) 2 MG gum Take 1 each (2 mg total) by mouth as needed for smoking cessation.   traZODone (DESYREL) 100 MG tablet TAKE 1 TABLET BY MOUTH EVERYDAY AT BEDTIME   Current Facility-Administered Medications on File Prior to Visit  Medication   0.9 %  sodium chloride infusion     Allergies:   Iohexol, Morphine and related, and Sulfa antibiotics   Social History   Tobacco Use   Smoking status: Current Every Day Smoker    Packs/day: 0.50    Years: 20.00    Pack years: 10.00    Types: Cigarettes   Smokeless tobacco: Never Used  Vaping Use   Vaping Use: Some days  Substance Use Topics   Alcohol use: No    Alcohol/week: 0.0 standard drinks   Drug use: No    Family History: The patient's family history  includes Breast cancer in her maternal aunt, maternal grandmother, and mother; Colon cancer in her paternal grandmother; Diabetes in her sister; Kidney disease in her sister.  ROS:   Please see the history of present illness.  Additional pertinent ROS otherwise unremarkable.   EKGs/Labs/Other Studies Reviewed:    The following studies were reviewed today: Echo 12/07/2016 Study Conclusions - Left ventricle: The cavity size was normal. Wall thickness was   normal. The estimated ejection fraction was 60%. Wall motion was   normal; there were no regional wall motion abnormalities. Doppler   parameters are consistent with abnormal left ventricular   relaxation (grade 1 diastolic dysfunction). - Aortic valve:  There was no stenosis. - Mitral valve: There was trivial regurgitation. - Right ventricle: The cavity size was normal. Systolic function   was normal. - Pulmonary arteries: No complete TR doppler jet so unable to   estimate PA systolic pressure. - Inferior vena cava: The vessel was normal in size. The   respirophasic diameter changes were in the normal range (>= 50%),   consistent with normal central venous pressure.  Impressions: - Normal LV size with EF 60%. Normal RV size and systolic function.   No significant valvular abnormalities.  EKG:  EKG is personally reviewed.  The ekg ordered today demonstrates sinus rhythm at 100 bpm  Recent Labs: 12/11/2018: BUN 24; Creatinine, Ser 1.07; Potassium 4.0; Sodium 142  Recent Lipid Panel    Component Value Date/Time   CHOL 194 11/22/2017 1054   TRIG 112 11/22/2017 1054   HDL 54 11/22/2017 1054   CHOLHDL 3.6 11/22/2017 1054   CHOLHDL 3.6 11/25/2015 1408   VLDL 29 11/25/2015 1408   LDLCALC 118 (H) 11/22/2017 1054    Physical Exam:    VS:  BP 132/70 (BP Location: Left Arm, Patient Position: Sitting, Cuff Size: Normal)    Pulse 100    Ht 5\' 9"  (1.753 m)    Wt 151 lb 12.8 oz (68.9 kg)    SpO2 95% Comment: at rest   BMI 22.42 kg/m     Wt Readings from Last 3 Encounters:  09/13/19 151 lb 12.8 oz (68.9 kg)  01/31/19 181 lb 6.4 oz (82.3 kg)  01/23/19 181 lb 12.8 oz (82.5 kg)    GEN: Well nourished, well developed in no acute distress HEENT: Normal, moist mucous membranes NECK: No JVD CARDIAC: regular rhythm, normal S1 and S2, no rubs or gallops. No murmur. VASCULAR: Radial and DP pulses 2+ bilaterally. No carotid bruits RESPIRATORY:  Clear to auscultation without rales, wheezing or rhonchi  ABDOMEN: Soft, non-tender, non-distended MUSCULOSKELETAL:  Ambulates independently SKIN: Warm and dry, no edema NEUROLOGIC:  Alert and oriented x 3. No focal neuro deficits noted. PSYCHIATRIC:  Normal affect   ASSESSMENT:    1. Essential  hypertension   2. Tobacco abuse counseling   3. Cardiac risk counseling   4. Counseling on health promotion and disease prevention    PLAN:    Sinus tachycardia: has improved with increasing activity and weight loss -right at 100 bpm today by ECG  Hypertension: Has been well controlled. Slightly above goal of <130/80 today. -continue current meds.  -encouraged continued lifestyle changes, focused on activity and healthy eating  Tobacco abuse, counseled to quit: The patient was counseled on tobacco cessation today for 4 minutes.  Counseling included reviewing the risks of smoking tobacco products, how it impacts the patient's current medical diagnoses and different strategies for quitting.  Pharmacotherapy to aid in tobacco cessation was not  prescribed today.  Primary prevention and heath counseling: -recommend heart healthy/Mediterranean diet, with whole grains, fruits, vegetable, fish, lean meats, nuts, and olive oil. Limit salt. -recommend moderate walking, 3-5 times/week for 30-50 minutes each session. Aim for at least 150 minutes.week. Goal should be pace of 3 miles/hours, or walking 1.5 miles in 30 minutes -recommend avoidance of tobacco products. Avoid excess alcohol. -Additional risk factor control:  -Diabetes: A1c is not recently available, no diabetes diagnosis  -Lipids: LDL 118 11/2017  -Blood pressure control: as above  -Weight: BMI 22 -ASCVD risk score: The 10-year ASCVD risk score Mikey Bussing DC Brooke Bonito., et al., 2013) is: 10.5%   Values used to calculate the score:     Age: 63 years     Sex: Female     Is Non-Hispanic African American: Yes     Diabetic: No     Tobacco smoker: Yes     Systolic Blood Pressure: 570 mmHg     Is BP treated: Yes     HDL Cholesterol: 54 mg/dL     Total Cholesterol: 194 mg/dL   Plan for follow up: 12 mos or sooner PRN  TIME SPENT WITH PATIENT: 27 minutes of direct patient care. More than 50% of that time was spent on coordination of care and  counseling regarding sinus tachycardia, mechanism as well as exercise recommendations.  Buford Dresser, MD, PhD [place]   [facility]   Medication Adjustments/Labs and Tests Ordered: Current medicines are reviewed at length with the patient today.  Concerns regarding medicines are outlined above.  Orders Placed This Encounter  Procedures   EKG 12-Lead   No orders of the defined types were placed in this encounter.   Patient Instructions  Medication Instructions:  Your Physician recommend you continue on your current medication as directed.    *If you need a refill on your cardiac medications before your next appointment, please call your pharmacy*   Lab Work: None   Testing/Procedures: None   Follow-Up: At [facility], you and your health needs are our priority.  As part of our continuing mission to provide you with exceptional heart care, we have created designated Provider Care Teams.  These Care Teams include your primary Cardiologist (physician) and Advanced Practice Providers (APPs -  Physician Assistants and Nurse Practitioners) who all work together to provide you with the care you need, when you need it.  We recommend signing up for the patient portal called "MyChart".  Sign up information is provided on this After Visit Summary.  MyChart is used to connect with patients for Virtual Visits (Telemedicine).  Patients are able to view lab/test results, encounter notes, upcoming appointments, etc.  Non-urgent messages can be sent to your provider as well.   To learn more about what you can do with MyChart, go to [email or web address].    Your next appointment:   1 year(s)  The format for your next appointment:   In Person  Provider:   Buford Dresser, MD       Signed, Buford Dresser, MD PhD 09/13/2019     [place]

## 2019-09-13 NOTE — Patient Instructions (Signed)

## 2019-11-01 ENCOUNTER — Encounter: Payer: Self-pay | Admitting: Cardiology

## 2019-11-07 DIAGNOSIS — Z23 Encounter for immunization: Secondary | ICD-10-CM | POA: Diagnosis not present

## 2019-11-11 DIAGNOSIS — J449 Chronic obstructive pulmonary disease, unspecified: Secondary | ICD-10-CM | POA: Insufficient documentation

## 2019-11-11 DIAGNOSIS — Z86711 Personal history of pulmonary embolism: Secondary | ICD-10-CM | POA: Insufficient documentation

## 2019-11-11 NOTE — Progress Notes (Deleted)
SUBJECTIVE:   Chief compliant/HPI: annual examination  [name] is a 54 y.o. who presents today for an annual exam.   PMH: HTN, CKD III, Bipolar I disorder, tobacco abuse, kidney donor, thoracic back pain, h/o murmur, history of pulmonary embolism in 1994 here for annual visit. Surgical Hx: Endometriosis s/p left fallopian tube removal. (2001); Appendectomy (2001); Nephrectomy living donor (1993); and Tonsillectomy. Family Hx: Breast cancer in her maternal grandmother and mother; Colon cancer in her paternal grandmother; Diabetes in her sister; Kidney disease in her sister  Social History: LMP: *** Contraception: *** Alcohol: Tobacco:   Tobacco use: Smoked *** pack/day x *** years (*** pack year history). Does ***not qualify for lung cancer screen at this time. Patient was counseled on the risks of tobacco use and cessation strongly encouraged.  The patient was counseled on tobacco cessation today for *** minutes.  Counseling included reviewing the risks of smoking tobacco products, how it impacts the patient's current medical diagnoses and different strategies for quitting.  Pharmacotherapy to aid in tobacco cessation was not prescribed today.  Recommendations; Annual lung cancer screening with low-dose CT to adults aged 72 to 40 years, with a 20 pack-year smoking history, and who currently smoke or have quit within the last 15 years (B recommendation). Screening should be discontinued once a person has not smoked for 15 years, develops a substantially limited life-expectancy, or no longer desires to continue screening.  Illicit Drugs: *** Safe at home: *** Depression/Suicidality: ***  Family History of GI or breast cancer: ***  Health Maintenance: Health Maintenance Due  Topic  . Hepatitis C Screening   . COVID-19 Vaccine (1)  . TETANUS/TDAP   . INFLUENZA VACCINE    History tabs reviewed and updated ***.   Review of systems form reviewed and notable for ***.    OBJECTIVE:   There were no vitals taken for this visit.  General: pleasant ***, sitting comfortably in exam chair, well nourished, well developed, in no acute distress with non-toxic appearance HEENT: normocephalic, atraumatic, moist mucous membranes, oropharynx clear without erythema or exudate, TM normal bilaterally  Neck: supple, non-tender without lymphadenopathy CV: regular rate and rhythm without murmurs, rubs, or gallops, no lower extremity edema, 2+ radial and pedal pulses bilaterally Lungs: clear to auscultation bilaterally with normal work of breathing on room air Resp: breathing comfortably on room air, speaking in full sentences Abdomen: soft, non-tender, non-distended, no masses or organomegaly palpable, normoactive bowel sounds Skin: warm, dry, no rashes or lesions Extremities: warm and well perfused, normal tone MSK: ROM grossly intact, strength intact, gait normal Neuro: Alert and oriented, speech normal  ASSESSMENT/PLAN:   Annual Physical Exam: Patient here today for annual physical exam.  PMH, surgical history, and social history were reviewed. The following concerns below were discussed.   CKD: follows with neprhology, Dr. Posey Pronto, last seen in [id number].  HTN: BP *** today. Currently on Losartan ***. Endorses copmliance. BMP to monitor.  Goal: <130/80. Tobacco abuse:  COPD: follows with pulmonology. Uses Albuterol PRN. Can walk up 3 flights of stairs No problem-specific Assessment & Plan notes found for this encounter.    Annual Examination  See AVS for age appropriate recommendations.  PHQ score ***, reviewed and discussed.  Blood pressure value is *** goal, discussed.   Considered the following screening exams based upon USPSTF recommendations: Diabetes screening: ordered Screening for elevated cholesterol: ordered HIV testing: {discussed/ordered:14545} Hepatitis C: ordered Hepatitis B: {discussed/ordered:14545} Syphilis if at high risk:  {discussed/ordered:14545} Reviewed risk  factors for latent tuberculosis and not indicated Colorectal cancer screening: up to date on screening for CRC. Last colonoscopy in 2018 with recommendations for repeat in 5 years. Due Jan 2023. Lung cancer screening: {discussed/declined/written VGKK:15947} See documentation below regarding discussion and indication.  Pap-smear: last performed in 2019 that was normal. Due 11/2020 Mammogram: last screened in 08/2019 that was normal. Due 08/2020.  Follow up in 1 year or sooner if indicated.   The 10-year ASCVD risk score Mikey Bussing DC Brooke Bonito., et al., 2013) is: 10.5%   [place]

## 2019-11-12 ENCOUNTER — Ambulatory Visit: Payer: Medicare Other | Admitting: Family Medicine

## 2019-11-12 DIAGNOSIS — Z86711 Personal history of pulmonary embolism: Secondary | ICD-10-CM

## 2019-11-12 DIAGNOSIS — N1831 Chronic kidney disease, stage 3a: Secondary | ICD-10-CM

## 2019-11-25 NOTE — Progress Notes (Signed)
SUBJECTIVE:   Chief compliant/HPI: annual examination  [name] is a 54 y.o. who presents today for an annual exam.   PMH: HTN, COPD, CKD III, Bipolar 1 disorder, tobacco use, h/o PE, kidney donor, sinus tachycardia, thoracic back pain, undiagnosed cardiac murmur  Social History: LMP: years ago Contraception: Menopause Alcohol: Occasional - social  Tobacco: 1/2 PPD x 20 years  Illicit Drugs: None Safe at home: None Depression/Suicidality: No  Health Maintenance: Health Maintenance Due  Topic  . Hepatitis C Screening   . TETANUS/TDAP    History tabs reviewed and updated.  [place]: breast cancer in her maternal aunt, maternal grandmother, and mother; Colon cancer in her paternal grandmother; Diabetes in her sister; Kidney disease in her sister.  Review of systems: See HPI.   OBJECTIVE:   BP 122/70   Pulse (!) 113   Ht 5\' 9"  (1.753 m)   Wt 154 lb 9.6 oz (70.1 kg)   SpO2 99%   BMI 22.83 kg/m   General: pleasant older woman, sitting comfortably in exam chair, well nourished, well developed, in no acute distress with non-toxic appearance HEENT: normocephalic, atraumatic, moist mucous membranes, oropharynx clear without erythema or exudate, TM normal bilaterally  Neck: supple, non-tender without lymphadenopathy CV: regular rate and rhythm, (+) 2/6 systolic murmur best heard in LUSB, 2+ radial and pedal pulses bilaterally Lungs: clear to auscultation bilaterally with normal work of breathing on room air Abdomen: soft, non-tender, non-distended,  normoactive bowel sounds Skin: warm, dry Extremities: warm and well perfused MSK: gait normal Neuro: Alert and oriented, speech normal  ASSESSMENT/PLAN:   Annual Physical Exam: Patient here today for annual physical exam.  PMH, surgical history, and social history were reviewed. The following concerns below were discussed.   HTN (hypertension) Chronic, well controlled. -BMP to monitor kidney function -Continue  losartan 25 mg daily  Chronic kidney disease (CKD), stage III (moderate) ([place]) Donated left kidney to sister. Follows with nephrology, Dr. Posey Pronto. Last seen in May 2021.  -Follow with nephrology as scheduled -BMP today  Tobacco abuse Smoked 1/2 pack/day x 20 years (10 pack year history). Does not qualify for lung cancer screen at this time. Patient was counseled on the risks of tobacco use and cessation strongly encouraged. Recommended to use patches.  Undiagnosed cardiac murmurs Chronic. Systolic murmur in LUSB. Follows with cardiology. Last echo in 2018 with trivial mitral valve regurg.  Consider repeat Echo to evaluate, however given asymptomatic with any symptoms with exertion can consider at later day. Will discuss at follow up, sooner if symptomatic.  Sinus tachycardia Follows closely with cardiology. Last seen 09/2019.     Annual Examination  See AVS for age appropriate recommendations  BP reviewed and at goal Asked about intimate partner violence and resources given as appropriate   Considered the following items based upon USPSTF recommendations: Diabetes screening: BMP to evaluate random BS Screening for elevated cholesterol: ordered HIV testing: declined Hepatitis C: ordered Hepatitis B: ordered Syphilis if at high risk: Declined GC/CT not at high risk and not ordered. Osteoporosis screening considered based upon risk of fracture from [facility] calculator. Major osteoporotic fracture risk is 6.2%. DEXA not ordered.  Reviewed risk factors for latent tuberculosis and not indicated  Cervical cancer screening: prior Pap reviewed, repeat due in Nov 2022 Breast cancer screening: Completed 09/05/2019 Colorectal cancer screening: up to date on screening for CRC.  Last Colonoscopy 02/03/19 with precancerous polyps. Repeat planned for 5 years (02/02/2021) (Lung cancer screening: No indicated.. See documentation  below regarding indications/risks/benefits.  Vaccinations: Due for TDAP -  recommended to get at local pharmacy at earliest convenience   Follow up in 1 year or sooner if indicated.    [place]

## 2019-11-26 ENCOUNTER — Ambulatory Visit (INDEPENDENT_AMBULATORY_CARE_PROVIDER_SITE_OTHER): Payer: Medicare Other | Admitting: Family Medicine

## 2019-11-26 ENCOUNTER — Other Ambulatory Visit: Payer: Self-pay

## 2019-11-26 ENCOUNTER — Encounter: Payer: Self-pay | Admitting: Family Medicine

## 2019-11-26 VITALS — BP 122/70 | HR 113 | Ht 69.0 in | Wt 154.6 lb

## 2019-11-26 DIAGNOSIS — Z13220 Encounter for screening for lipoid disorders: Secondary | ICD-10-CM | POA: Diagnosis not present

## 2019-11-26 DIAGNOSIS — I10 Essential (primary) hypertension: Secondary | ICD-10-CM

## 2019-11-26 DIAGNOSIS — Z720 Tobacco use: Secondary | ICD-10-CM | POA: Diagnosis not present

## 2019-11-26 DIAGNOSIS — Z23 Encounter for immunization: Secondary | ICD-10-CM | POA: Diagnosis not present

## 2019-11-26 DIAGNOSIS — Z1159 Encounter for screening for other viral diseases: Secondary | ICD-10-CM

## 2019-11-26 DIAGNOSIS — R011 Cardiac murmur, unspecified: Secondary | ICD-10-CM | POA: Diagnosis not present

## 2019-11-26 DIAGNOSIS — N1831 Chronic kidney disease, stage 3a: Secondary | ICD-10-CM

## 2019-11-26 DIAGNOSIS — R000 Tachycardia, unspecified: Secondary | ICD-10-CM | POA: Diagnosis not present

## 2019-11-26 MED ORDER — TETANUS-DIPHTH-ACELL PERTUSSIS 5-2.5-18.5 LF-MCG/0.5 IM SUSP: 0.5000 mL | Freq: Once | INTRAMUSCULAR | 0 refills | Status: AC

## 2019-11-26 NOTE — Assessment & Plan Note (Signed)
Follows closely with cardiology. Last seen 09/2019.

## 2019-11-26 NOTE — Assessment & Plan Note (Signed)
Chronic. Systolic murmur in LUSB. Follows with cardiology. Last echo in 2018 with trivial mitral valve regurg.  Consider repeat Echo to evaluate, however given asymptomatic with any symptoms with exertion can consider at later day. Will discuss at follow up, sooner if symptomatic.

## 2019-11-26 NOTE — Patient Instructions (Signed)
It was a pleasure to see you today!  Thank you for choosing [facility] for your primary care.  [name] was seen for annual physical   Our plans for today were:  Continue to work on weight loss.  Continue to work on smoking cessation. Let me know if you if you need any additional nicotine replacement aids  I have gotten some labs. I will call you if anything is abnormal.   To keep you healthy, please keep in mind the following health maintenance items that you are due for:   1. TDAP - get for local pharmacy   You should return to our clinic in 1 year for annual physical.   Best Wishes,   Mina Marble, DO

## 2019-11-26 NOTE — Assessment & Plan Note (Signed)
Smoked 1/2 pack/day x 20 years (10 pack year history). Does not qualify for lung cancer screen at this time. Patient was counseled on the risks of tobacco use and cessation strongly encouraged. Recommended to use patches.

## 2019-11-26 NOTE — Assessment & Plan Note (Signed)
Chronic, well controlled. -BMP to monitor kidney function -Continue losartan 25 mg daily

## 2019-11-26 NOTE — Assessment & Plan Note (Signed)
Donated left kidney to sister. Follows with nephrology, Dr. Posey Pronto. Last seen in May 2021.  -Follow with nephrology as scheduled -BMP today

## 2019-11-27 ENCOUNTER — Other Ambulatory Visit: Payer: Self-pay | Admitting: Family Medicine

## 2019-11-27 ENCOUNTER — Encounter: Payer: Self-pay | Admitting: Family Medicine

## 2019-11-27 DIAGNOSIS — E785 Hyperlipidemia, unspecified: Secondary | ICD-10-CM

## 2019-11-27 LAB — BASIC METABOLIC PANEL
BUN/Creatinine Ratio: 13 (ref 9–23)
BUN: 14 mg/dL (ref 6–24)
CO2: 25 mmol/L (ref 20–29)
Calcium: 9.5 mg/dL (ref 8.7–10.2)
Chloride: 103 mmol/L (ref 96–106)
Creatinine, Ser: 1.07 mg/dL — ABNORMAL HIGH (ref 0.57–1.00)
GFR calc Af Amer: 68 mL/min/{1.73_m2} (ref >59)
GFR calc non Af Amer: 59 mL/min/{1.73_m2} — ABNORMAL LOW (ref >59)
Glucose: 85 mg/dL (ref 65–99)
Potassium: 4.1 mmol/L (ref 3.5–5.2)
Sodium: 142 mmol/L (ref 134–144)

## 2019-11-27 LAB — LIPID PANEL
Chol/HDL Ratio: 3.4 ratio (ref 0.0–4.4)
Cholesterol, Total: 213 mg/dL — ABNORMAL HIGH (ref 100–199)
HDL: 63 mg/dL (ref >39)
LDL Chol Calc (NIH): 128 mg/dL — ABNORMAL HIGH (ref 0–99)
Triglycerides: 122 mg/dL (ref 0–149)
VLDL Cholesterol Cal: 22 mg/dL (ref 5–40)

## 2019-11-27 LAB — HEPATITIS C ANTIBODY: Hep C Virus Ab: <0.1 s/co ratio (ref 0.0–0.9)

## 2019-11-27 LAB — HEPATITIS B SURFACE ANTIGEN: Hepatitis B Surface Ag: NEGATIVE

## 2019-11-27 MED ORDER — ATORVASTATIN CALCIUM 40 MG PO TABS: 40.0000 mg | ORAL_TABLET | Freq: Every day | ORAL | 3 refills | Status: DC

## 2019-11-30 ENCOUNTER — Other Ambulatory Visit: Payer: Self-pay

## 2019-11-30 MED ORDER — LOSARTAN POTASSIUM 25 MG PO TABS
25.0000 mg | ORAL_TABLET | Freq: Every day | ORAL | 3 refills | Status: DC
Start: 2019-11-30 — End: 2020-08-05

## 2020-01-24 ENCOUNTER — Other Ambulatory Visit: Payer: Self-pay | Admitting: Family Medicine

## 2020-02-05 DIAGNOSIS — H0102A Squamous blepharitis right eye, upper and lower eyelids: Secondary | ICD-10-CM | POA: Diagnosis not present

## 2020-02-05 DIAGNOSIS — H3581 Retinal edema: Secondary | ICD-10-CM | POA: Diagnosis not present

## 2020-02-05 DIAGNOSIS — H2513 Age-related nuclear cataract, bilateral: Secondary | ICD-10-CM | POA: Diagnosis not present

## 2020-02-05 DIAGNOSIS — H1045 Other chronic allergic conjunctivitis: Secondary | ICD-10-CM | POA: Diagnosis not present

## 2020-02-05 DIAGNOSIS — H0102B Squamous blepharitis left eye, upper and lower eyelids: Secondary | ICD-10-CM | POA: Diagnosis not present

## 2020-02-05 DIAGNOSIS — H35372 Puckering of macula, left eye: Secondary | ICD-10-CM | POA: Diagnosis not present

## 2020-02-05 DIAGNOSIS — H16223 Keratoconjunctivitis sicca, not specified as Sjogren's, bilateral: Secondary | ICD-10-CM | POA: Diagnosis not present

## 2020-02-05 DIAGNOSIS — H25013 Cortical age-related cataract, bilateral: Secondary | ICD-10-CM | POA: Diagnosis not present

## 2020-02-15 ENCOUNTER — Other Ambulatory Visit: Payer: Self-pay | Admitting: Family Medicine

## 2020-02-15 DIAGNOSIS — Z5181 Encounter for therapeutic drug level monitoring: Secondary | ICD-10-CM

## 2020-02-15 DIAGNOSIS — F319 Bipolar disorder, unspecified: Secondary | ICD-10-CM

## 2020-02-15 NOTE — Telephone Encounter (Signed)
Please schedule patient for a LAB visit at earliest convenience for additional lab work not completed at her yearly. This lab work necessary to monitor when someone is taking the medicine Lamictal. I have placed the future orders. I will call her with the results if they are abnormal. Thank you.

## 2020-03-25 DIAGNOSIS — H2513 Age-related nuclear cataract, bilateral: Secondary | ICD-10-CM | POA: Diagnosis not present

## 2020-03-25 DIAGNOSIS — H1045 Other chronic allergic conjunctivitis: Secondary | ICD-10-CM | POA: Diagnosis not present

## 2020-03-25 DIAGNOSIS — H35372 Puckering of macula, left eye: Secondary | ICD-10-CM | POA: Diagnosis not present

## 2020-03-25 DIAGNOSIS — H0102A Squamous blepharitis right eye, upper and lower eyelids: Secondary | ICD-10-CM | POA: Diagnosis not present

## 2020-03-25 DIAGNOSIS — H0102B Squamous blepharitis left eye, upper and lower eyelids: Secondary | ICD-10-CM | POA: Diagnosis not present

## 2020-04-19 DIAGNOSIS — M5416 Radiculopathy, lumbar region: Secondary | ICD-10-CM | POA: Diagnosis not present

## 2020-05-10 DIAGNOSIS — Z23 Encounter for immunization: Secondary | ICD-10-CM | POA: Diagnosis not present

## 2020-08-03 ENCOUNTER — Other Ambulatory Visit: Payer: Self-pay | Admitting: Family Medicine

## 2020-08-07 DIAGNOSIS — M5412 Radiculopathy, cervical region: Secondary | ICD-10-CM | POA: Diagnosis not present

## 2020-08-11 DIAGNOSIS — R29898 Other symptoms and signs involving the musculoskeletal system: Secondary | ICD-10-CM | POA: Diagnosis not present

## 2020-08-15 ENCOUNTER — Other Ambulatory Visit: Payer: Self-pay

## 2020-08-15 ENCOUNTER — Emergency Department (HOSPITAL_COMMUNITY): Payer: Medicare Other

## 2020-08-15 ENCOUNTER — Observation Stay (HOSPITAL_COMMUNITY): Payer: Medicare Other

## 2020-08-15 ENCOUNTER — Inpatient Hospital Stay (HOSPITAL_COMMUNITY)
Admission: EM | Admit: 2020-08-15 | Discharge: 2020-08-21 | DRG: 038 | Disposition: A | Discharge status: Home / Self Care | Payer: Medicare Other | Attending: Internal Medicine | Admitting: Internal Medicine

## 2020-08-15 ENCOUNTER — Encounter (HOSPITAL_COMMUNITY): Payer: Self-pay

## 2020-08-15 DIAGNOSIS — I6522 Occlusion and stenosis of left carotid artery: Secondary | ICD-10-CM | POA: Diagnosis present

## 2020-08-15 DIAGNOSIS — I63239 Cerebral infarction due to unspecified occlusion or stenosis of unspecified carotid arteries: Secondary | ICD-10-CM | POA: Diagnosis present

## 2020-08-15 DIAGNOSIS — M546 Pain in thoracic spine: Secondary | ICD-10-CM | POA: Diagnosis not present

## 2020-08-15 DIAGNOSIS — Z841 Family history of disorders of kidney and ureter: Secondary | ICD-10-CM

## 2020-08-15 DIAGNOSIS — D72829 Elevated white blood cell count, unspecified: Secondary | ICD-10-CM | POA: Diagnosis not present

## 2020-08-15 DIAGNOSIS — R471 Dysarthria and anarthria: Secondary | ICD-10-CM | POA: Diagnosis present

## 2020-08-15 DIAGNOSIS — Z79899 Other long term (current) drug therapy: Secondary | ICD-10-CM

## 2020-08-15 DIAGNOSIS — E876 Hypokalemia: Secondary | ICD-10-CM

## 2020-08-15 DIAGNOSIS — N183 Chronic kidney disease, stage 3 unspecified: Secondary | ICD-10-CM | POA: Diagnosis present

## 2020-08-15 DIAGNOSIS — Z20822 Contact with and (suspected) exposure to covid-19: Secondary | ICD-10-CM | POA: Diagnosis present

## 2020-08-15 DIAGNOSIS — Z833 Family history of diabetes mellitus: Secondary | ICD-10-CM

## 2020-08-15 DIAGNOSIS — I10 Essential (primary) hypertension: Secondary | ICD-10-CM

## 2020-08-15 DIAGNOSIS — G8929 Other chronic pain: Secondary | ICD-10-CM | POA: Diagnosis not present

## 2020-08-15 DIAGNOSIS — F319 Bipolar disorder, unspecified: Secondary | ICD-10-CM

## 2020-08-15 DIAGNOSIS — E785 Hyperlipidemia, unspecified: Secondary | ICD-10-CM | POA: Diagnosis present

## 2020-08-15 DIAGNOSIS — G8191 Hemiplegia, unspecified affecting right dominant side: Secondary | ICD-10-CM | POA: Diagnosis not present

## 2020-08-15 DIAGNOSIS — Z720 Tobacco use: Secondary | ICD-10-CM

## 2020-08-15 DIAGNOSIS — I63412 Cerebral infarction due to embolism of left middle cerebral artery: Secondary | ICD-10-CM | POA: Diagnosis not present

## 2020-08-15 DIAGNOSIS — Z91041 Radiographic dye allergy status: Secondary | ICD-10-CM

## 2020-08-15 DIAGNOSIS — I129 Hypertensive chronic kidney disease with stage 1 through stage 4 chronic kidney disease, or unspecified chronic kidney disease: Secondary | ICD-10-CM | POA: Diagnosis present

## 2020-08-15 DIAGNOSIS — R4701 Aphasia: Secondary | ICD-10-CM | POA: Diagnosis present

## 2020-08-15 DIAGNOSIS — M419 Scoliosis, unspecified: Secondary | ICD-10-CM | POA: Diagnosis present

## 2020-08-15 DIAGNOSIS — N179 Acute kidney failure, unspecified: Secondary | ICD-10-CM | POA: Diagnosis not present

## 2020-08-15 DIAGNOSIS — E782 Mixed hyperlipidemia: Secondary | ICD-10-CM | POA: Diagnosis not present

## 2020-08-15 DIAGNOSIS — Z86711 Personal history of pulmonary embolism: Secondary | ICD-10-CM

## 2020-08-15 DIAGNOSIS — Z524 Kidney donor: Secondary | ICD-10-CM

## 2020-08-15 DIAGNOSIS — I6501 Occlusion and stenosis of right vertebral artery: Secondary | ICD-10-CM | POA: Diagnosis present

## 2020-08-15 DIAGNOSIS — M545 Low back pain, unspecified: Secondary | ICD-10-CM | POA: Diagnosis present

## 2020-08-15 DIAGNOSIS — G459 Transient cerebral ischemic attack, unspecified: Secondary | ICD-10-CM | POA: Diagnosis not present

## 2020-08-15 DIAGNOSIS — I639 Cerebral infarction, unspecified: Secondary | ICD-10-CM | POA: Diagnosis present

## 2020-08-15 DIAGNOSIS — Z882 Allergy status to sulfonamides status: Secondary | ICD-10-CM

## 2020-08-15 DIAGNOSIS — F17210 Nicotine dependence, cigarettes, uncomplicated: Secondary | ICD-10-CM | POA: Diagnosis present

## 2020-08-15 DIAGNOSIS — Z885 Allergy status to narcotic agent status: Secondary | ICD-10-CM

## 2020-08-15 HISTORY — DX: Hypokalemia: E87.6

## 2020-08-15 LAB — CBC WITH DIFFERENTIAL/PLATELET
Abs Immature Granulocytes: 0 10*3/uL (ref 0.00–0.07)
Basophils Absolute: 0 10*3/uL (ref 0.0–0.1)
Basophils Relative: 0 %
Eosinophils Absolute: 0 10*3/uL (ref 0.0–0.5)
Eosinophils Relative: 0 %
HCT: 44.1 % (ref 36.0–46.0)
Hemoglobin: 14.3 g/dL (ref 12.0–15.0)
Lymphocytes Relative: 62 %
Lymphs Abs: 10.2 10*3/uL — ABNORMAL HIGH (ref 0.7–4.0)
MCH: 28 pg (ref 26.0–34.0)
MCHC: 32.4 g/dL (ref 30.0–36.0)
MCV: 86.3 fL (ref 80.0–100.0)
Monocytes Absolute: 0.7 10*3/uL (ref 0.1–1.0)
Monocytes Relative: 4 %
Neutro Abs: 5.6 10*3/uL (ref 1.7–7.7)
Neutrophils Relative %: 34 %
Platelets: 317 10*3/uL (ref 150–400)
RBC: 5.11 MIL/uL (ref 3.87–5.11)
RDW: 14.9 % (ref 11.5–15.5)
WBC: 16.4 10*3/uL — ABNORMAL HIGH (ref 4.0–10.5)
nRBC: 0 % (ref 0.0–0.2)

## 2020-08-15 LAB — COMPREHENSIVE METABOLIC PANEL
ALT: 16 U/L (ref 0–44)
AST: 13 U/L — ABNORMAL LOW (ref 15–41)
Albumin: 3.6 g/dL (ref 3.5–5.0)
Alkaline Phosphatase: 64 U/L (ref 38–126)
Anion gap: 6 (ref 5–15)
BUN: 21 mg/dL — ABNORMAL HIGH (ref 6–20)
CO2: 26 mmol/L (ref 22–32)
Calcium: 8.6 mg/dL — ABNORMAL LOW (ref 8.9–10.3)
Chloride: 106 mmol/L (ref 98–111)
Creatinine, Ser: 1.16 mg/dL — ABNORMAL HIGH (ref 0.44–1.00)
GFR, Estimated: 55 mL/min — ABNORMAL LOW (ref >60)
Glucose, Bld: 106 mg/dL — ABNORMAL HIGH (ref 70–99)
Potassium: 3.4 mmol/L — ABNORMAL LOW (ref 3.5–5.1)
Sodium: 138 mmol/L (ref 135–145)
Total Bilirubin: 0.6 mg/dL (ref 0.3–1.2)
Total Protein: 6.6 g/dL (ref 6.5–8.1)

## 2020-08-15 LAB — URINALYSIS, ROUTINE W REFLEX MICROSCOPIC
Bilirubin Urine: NEGATIVE
Glucose, UA: NEGATIVE mg/dL
Ketones, ur: NEGATIVE mg/dL
Nitrite: NEGATIVE
Protein, ur: 100 mg/dL — AB
Specific Gravity, Urine: 1.012 (ref 1.005–1.030)
pH: 6 (ref 5.0–8.0)

## 2020-08-15 LAB — RESP PANEL BY RT-PCR (FLU A&B, COVID) ARPGX2
Influenza A by PCR: NEGATIVE
Influenza B by PCR: NEGATIVE
SARS Coronavirus 2 by RT PCR: NEGATIVE

## 2020-08-15 MED ORDER — POTASSIUM CHLORIDE CRYS ER 20 MEQ PO TBCR
40.0000 meq | EXTENDED_RELEASE_TABLET | Freq: Once | ORAL | Status: AC
  Administered 2020-08-15: 40 meq via ORAL
  Filled 2020-08-15: qty 2

## 2020-08-15 MED ORDER — NICOTINE 14 MG/24HR TD PT24
14.0000 mg | MEDICATED_PATCH | Freq: Every day | TRANSDERMAL | Status: DC
  Administered 2020-08-15 – 2020-08-21 (×6): 14 mg via TRANSDERMAL
  Filled 2020-08-15 (×7): qty 1

## 2020-08-15 MED ORDER — ASPIRIN 81 MG PO CHEW
324.0000 mg | CHEWABLE_TABLET | Freq: Once | ORAL | Status: AC
  Administered 2020-08-15: 324 mg via ORAL
  Filled 2020-08-15: qty 4

## 2020-08-15 MED ORDER — ASPIRIN EC 81 MG PO TBEC
81.0000 mg | DELAYED_RELEASE_TABLET | Freq: Every day | ORAL | Status: DC
  Administered 2020-08-16 – 2020-08-21 (×5): 81 mg via ORAL
  Filled 2020-08-15 (×5): qty 1

## 2020-08-15 NOTE — ED Provider Notes (Signed)
Emergency Medicine Provider Triage Evaluation Note  [name] , a 55 y.o. female  was evaluated in triage.  Pt complains of 30 minutes of numbness on L side of face and inabilliy to speak. Resolved when she got here, back to baseline now. Pt states that she has never had  a stroke. No other deficits at that time.   Review of Systems  Positive: Inability to speak and L side numbness  Negative: LOC, weakness, facial droop  Physical Exam  BP (!) 174/149 (BP Location: Left Arm)   Pulse (!) 108   Temp 98.8 F (37.1 C) (Oral)   Resp 20   SpO2 99%  Gen:   Awake, no distress   Resp:  Normal effort  MSK:   Moves extremities without difficulty  Other:  Alert. Clear speech. No facial droop. CNIII-XII grossly intact. Bilateral upper and lower extremities' sensation grossly intact. 5/5 symmetric strength with grip strength and with plantar and dorsi flexion bilaterally. Patellar DTRs are 2+ and symmetric . Normal finger to nose bilaterally. Negative pronator drift.   Medical Decision Making  Medically screening exam initiated at 3:31 PM.  Appropriate orders placed.  [name] was informed that the remainder of the evaluation will be completed by another provider, this initial triage assessment does not replace that evaluation, and the importance of remaining in the ED until their evaluation is complete.     Alfredia Client, PA-C 08/15/20 [place], MD 08/19/20 1327

## 2020-08-15 NOTE — ED Provider Notes (Signed)
[facility] DEPT Provider Note   CSN: [id number] Arrival date & time: 08/15/20  1518     History Chief Complaint  Patient presents with   Transient Ischemic Attack    [name] is a 55 y.o. female.  55 yo F with about 30 min of difficulty speaking.  Felt like she couldn't say what she wanted.  Felt like her left face felt different as well.  Recent spinal injection has had some numbness and weakness to the R arm.  Started on steroids by ortho.   The history is provided by the patient.  Illness Severity:  Moderate Onset quality:  Gradual Duration:  30 minutes Timing:  Constant Progression:  Worsening Chronicity:  New Associated symptoms: no chest pain, no congestion, no fever, no headaches, no myalgias, no nausea, no rhinorrhea, no shortness of breath, no vomiting and no wheezing       Past Medical History:  Diagnosis Date   Bipolar 1 disorder ([name])    Chronic kidney disease    donated left kidney   Hypertension    Pneumonia 2010   had 2 episodes/ was sent home on O2   Pulmonary embolism ([name]) 1994   Only 1 clot, not on lifelong anticoagulation    Patient Active Problem List   Diagnosis Date Noted   TIA (transient ischemic attack) 08/15/2020   COPD (chronic obstructive pulmonary disease) ([place]) 11/11/2019   History of pulmonary embolism 11/11/2019   Chronic kidney disease (CKD), stage III (moderate) ([place]) 01/26/2019   Sinus tachycardia 12/07/2017   Thoracic back pain 10/21/2017   Undiagnosed cardiac murmurs 07/12/2016   HTN (hypertension) 06/06/2014   Tobacco abuse 10/07/2011   Currently attempting to quit smoking 06/16/2011   Bipolar 1 disorder ([name]) 08/17/2010   Kidney donor 08/14/2010    Past Surgical History:  Procedure Laterality Date   APPENDECTOMY  2001   Endometriosis s/p left fallopian tube removal.  2001   NEPHRECTOMY LIVING DONOR  1993   Donated kidney to sister   TONSILLECTOMY       OB History   No  obstetric history on file.     Family History  Problem Relation Age of Onset   Breast cancer Mother    Breast cancer Maternal Grandmother    Diabetes Sister    Kidney disease Sister    Colon cancer Paternal Grandmother    Breast cancer Maternal Aunt     Social History   Tobacco Use   Smoking status: Every Day    Packs/day: 0.50    Years: 20.00    Pack years: 10.00    Types: Cigarettes   Smokeless tobacco: Never  Vaping Use   Vaping Use: Some days  Substance Use Topics   Alcohol use: No    Alcohol/week: 0.0 standard drinks   Drug use: No    Home Medications Prior to Admission medications   Medication Sig Start Date End Date Taking? Authorizing Provider  albuterol (VENTOLIN HFA) 108 (90 Base) MCG/ACT inhaler TAKE 2 PUFFS BY MOUTH EVERY 6 HOURS AS NEEDED FOR WHEEZE 02/12/19   Benay Pike, MD  atorvastatin (LIPITOR) 40 MG tablet Take 1 tablet (40 mg total) by mouth daily. 11/27/19   Mullis, Kiersten P, DO  Cholecalciferol (VITAMIN D) 125 MCG (5000 UT) CAPS Take 5,000 Units by mouth.    [provider]  HYDROcodone-acetaminophen (NORCO/VICODIN) 5-325 MG tablet hydrocodone 5 mg-acetaminophen 325 mg tablet  TAKE 1 TABLET 3 TIMES A DAY BY ORAL ROUTE AS  NEEDED.    [provider]  lamoTRIgine (LAMICTAL) 200 MG tablet TAKE 1 TABLET BY MOUTH EVERY DAY 02/15/20   Mullis, Kiersten P, DO  losartan (COZAAR) 25 MG tablet TAKE 1 TABLET BY MOUTH EVERYDAY AT BEDTIME 08/05/20   Maness, Philip, MD  nicotine (NICODERM CQ - DOSED IN MG/24 HOURS) 14 mg/24hr patch Place 1 patch (14 mg total) onto the skin daily. 12/01/18   Benay Pike, MD  nicotine polacrilex (NICORETTE) 2 MG gum Take 1 each (2 mg total) by mouth as needed for smoking cessation. 12/01/18   Benay Pike, MD  traZODone (DESYREL) 100 MG tablet TAKE 1 TABLET BY MOUTH EVERYDAY AT BEDTIME 01/24/20   Mullis, Kiersten P, DO    Allergies    Iohexol, Morphine and related, and Sulfa antibiotics  Review of Systems    Review of Systems  Constitutional:  Negative for chills and fever.  HENT:  Negative for congestion and rhinorrhea.   Eyes:  Negative for redness and visual disturbance.  Respiratory:  Negative for shortness of breath and wheezing.   Cardiovascular:  Negative for chest pain and palpitations.  Gastrointestinal:  Negative for nausea and vomiting.  Genitourinary:  Negative for dysuria and urgency.  Musculoskeletal:  Negative for arthralgias and myalgias.  Skin:  Negative for pallor and wound.  Neurological:  Positive for speech difficulty and numbness. Negative for dizziness and headaches.   Physical Exam Updated Vital Signs BP (!) 174/149 (BP Location: Left Arm)   Pulse (!) 108   Temp 98.8 F (37.1 C) (Oral)   Resp 20   Ht '5\' 9"'$  (1.753 m)   Wt 70 kg   SpO2 99%   BMI 22.79 kg/m   Physical Exam Vitals and nursing note reviewed.  Constitutional:      General: She is not in acute distress.    Appearance: She is well-developed. She is not diaphoretic.  HENT:     Head: Normocephalic and atraumatic.  Eyes:     Pupils: Pupils are equal, round, and reactive to light.  Cardiovascular:     Rate and Rhythm: Normal rate and regular rhythm.     Heart sounds: No murmur heard.   No friction rub. No gallop.  Pulmonary:     Effort: Pulmonary effort is normal.     Breath sounds: No wheezing or rales.  Abdominal:     General: There is no distension.     Palpations: Abdomen is soft.     Tenderness: There is no abdominal tenderness.  Musculoskeletal:        General: No tenderness.     Cervical back: Normal range of motion and neck supple.  Skin:    General: Skin is warm and dry.  Neurological:     Mental Status: She is alert and oriented to person, place, and time.     Comments: Right upper extremity and 4 out of 5 compared to left 5 out of 5.  Otherwise benign neurologic exam.  Psychiatric:        Behavior: Behavior normal.    ED Results / Procedures / Treatments   Labs (all labs  ordered are listed, but only abnormal results are displayed) Labs Reviewed  COMPREHENSIVE METABOLIC PANEL - Abnormal; Notable for the following components:      Result Value   Potassium 3.4 (*)    Glucose, Bld 106 (*)    BUN 21 (*)    Creatinine, Ser 1.16 (*)    Calcium 8.6 (*)  AST 13 (*)    GFR, Estimated 55 (*)    All other components within normal limits  CBC WITH DIFFERENTIAL/PLATELET - Abnormal; Notable for the following components:   WBC 16.4 (*)    Lymphs Abs 10.2 (*)    All other components within normal limits  URINALYSIS, ROUTINE W REFLEX MICROSCOPIC  PATHOLOGIST SMEAR REVIEW    EKG EKG Interpretation  Date/Time:  Friday August 15 2020 16:23:59 EDT Ventricular Rate:  89 PR Interval:  128 QRS Duration: 88 QT Interval:  342 QTC Calculation: 416 R Axis:   79 Text Interpretation: Normal sinus rhythm Normal ECG No significant change since last tracing Confirmed by Deno Etienne [phone]) on 08/15/2020 4:31:49 PM  Radiology CT Head Wo Contrast  Result Date: 08/15/2020 CLINICAL DATA:  Transient ischemic attack (TIA) EXAM: CT HEAD WITHOUT CONTRAST TECHNIQUE: Contiguous axial images were obtained from the base of the skull through the vertex without intravenous contrast. COMPARISON:  None. FINDINGS: Brain: No evidence of acute large vascular territory infarction, hemorrhage, hydrocephalus, extra-axial collection or mass lesion/mass effect. Predominately periventricular white matter hypodensities, advanced for age. Vascular: No hyperdense vessel identified. Skull: No acute fracture. Sinuses/Orbits: Visualized sinuses are clear. No acute orbital findings. Other: No mastoid effusions. IMPRESSION: Age advanced predominately periventricular white matter hypodensities, which could be secondary to chronic microvascular ischemic disease or demyelination. Given the patient's neurologic symptoms, recommend MRI with contrast to further evaluate. Electronically Signed   By: Margaretha Sheffield MD    On: 08/15/2020 16:26    Procedures Procedures  Discussed smoking cessation with patient and was they were offerred resources to help stop.  Total time was 5 min CPT code 99406.   Medications Ordered in ED Medications  aspirin chewable tablet 324 mg (has no administration in time range)    ED Course  I have reviewed the triage vital signs and the nursing notes.  Pertinent labs & imaging results that were available during my care of the patient were reviewed by me and considered in my medical decision making (see chart for details).    MDM Rules/Calculators/A&P                           70 yoF with a chief complaints of aphasia lasted for about 30 minutes or so and now is resolved.  She also felt like the left side of her face felt different than normal.  That is also resolved.  She had a recent spinal injection which is given her right upper extremity weakness which is all I find on exam.  Will discuss with neurology.  I discussed the case with Dr. Lorrin Goodell, recommended an MRI of the brain as well as C-spine.  Recommended TIA work-up regardless of MRI findings.  Request admission for [facility].   The patients results and plan were reviewed and discussed.   Any x-rays performed were independently reviewed by myself.   Differential diagnosis were considered with the presenting HPI.  Medications  aspirin chewable tablet 324 mg (has no administration in time range)    Vitals:   08/15/20 1526 08/15/20 1543  BP: (!) 174/149   Pulse: (!) 108   Resp: 20   Temp: 98.8 F (37.1 C)   TempSrc: Oral   SpO2: 99%   Weight:  70 kg  Height:  '5\' 9"'$  (1.753 m)    Final diagnoses:  TIA (transient ischemic attack)    Admission/ observation were discussed with the admitting physician, patient and/or  family and they are comfortable with the plan.     Final Clinical Impression(s) / ED Diagnoses Final diagnoses:  TIA (transient ischemic attack)    Rx / DC Orders ED Discharge Orders      None        Deno Etienne, DO 08/15/20 1832

## 2020-08-15 NOTE — ED Triage Notes (Signed)
Pt reports left facial numbness and unable to speak x 17mns. Patient back at baseline

## 2020-08-15 NOTE — H&P (Addendum)
History and Physical  [name] [id number] DOB: August 12, 1965 DOA: 08/15/2020  Referring physician: Deno Etienne, DO PCP: Delora Fuel, MD  Patient coming from: Home  Chief Complaint: Speech difficulty  HPI: [name] is a 55 y.o. female with medical history significant for hyperlipidemia, hypertension, bipolar disorder, scoliosis, tobacco use who presents to the emergency department due to sudden onset of difficulty in being able to speak while at work about 30 minutes PTA.  Patient states that she knows what she wanted to say, but she had difficulty in being able to get the words out and her coworkers were concerned about this sudden change in her.  She denied any other weakness other than right hand weakness which occurred after receiving injection for nerve blockade due to back pain, she has since followed up with her orthopedic surgeon who started her on steroids.  Speech difficulty resolved in transit to the ED. She denies chest pain, shortness of breath, nausea, vomiting, headache  ED Course:  In the emergency department, she was tachycardic, BP was 174/149, other vital signs were within normal range.  Work-up in the ED showed leukocytosis, hypokalemia, BUN to creatinine 21/1.16 (baseline creatinine at 1.0-1.2). CT of head without contrast age advanced predominately periventricular white matter hypodensities, which could be secondary to chronic microvascular ischemic disease or demyelination. Aspirin 324 mg x 1 was given.  Neurologist was consulted and recommended MRI of brain and C-spine as well as TIA work-up.  Hospitalist was asked to admit patient for further evaluation and management.  Review of Systems: Constitutional: Negative for chills and fever.  HENT: Negative for ear pain and sore throat.   Eyes: Negative for pain and visual disturbance.  Respiratory: Negative for cough, chest tightness and shortness of breath.   Cardiovascular: Negative for chest pain and  palpitations.  Gastrointestinal: Negative for abdominal pain and vomiting.  Endocrine: Negative for polyphagia and polyuria.  Genitourinary: Negative for decreased urine volume, dysuria, enuresis Musculoskeletal: Negative for arthralgias and back pain.  Skin: Negative for color change and rash.  Allergic/Immunologic: Negative for immunocompromised state.  Neurological: Positive for speech difficulty and right hand numbness.  Negative for tremors, syncope Hematological: Does not bruise/bleed easily.  All other systems reviewed and are negative   Past Medical History:  Diagnosis Date   Bipolar 1 disorder ([place])    Chronic kidney disease    donated left kidney   Hypertension    Pneumonia 2010   had 2 episodes/ was sent home on O2   Pulmonary embolism ([place]) 1994   Only 1 clot, not on lifelong anticoagulation   Past Surgical History:  Procedure Laterality Date   APPENDECTOMY  2001   Endometriosis s/p left fallopian tube removal.  2001   [place]   Donated kidney to sister   TONSILLECTOMY      Social History:  reports that she has been smoking cigarettes. She has a 10.00 pack-year smoking history. She has never used smokeless tobacco. She reports that she does not drink alcohol and does not use drugs.   Allergies  Allergen Reactions   Iohexol Hives    Hives 06/06/04, needs pre meds    Morphine And Related Hives and Itching    Denies Airway involvement   Sulfa Antibiotics Itching and Rash    Reported with TMP/ SMX  Denies Airway involvement    Family History  Problem Relation Age of Onset   Breast cancer Mother    Breast cancer Maternal Grandmother  Diabetes Sister    Kidney disease Sister    Colon cancer Paternal Grandmother    Breast cancer Maternal Aunt      Prior to Admission medications   Medication Sig Start Date End Date Taking? Authorizing Provider  albuterol (VENTOLIN HFA) 108 (90 Base) MCG/ACT inhaler TAKE 2 PUFFS BY MOUTH EVERY 6  HOURS AS NEEDED FOR WHEEZE 02/12/19   Benay Pike, MD  atorvastatin (LIPITOR) 40 MG tablet Take 1 tablet (40 mg total) by mouth daily. 11/27/19   Mullis, Kiersten P, DO  Cholecalciferol (VITAMIN D) 125 MCG (5000 UT) CAPS Take 5,000 Units by mouth.    [provider]  HYDROcodone-acetaminophen (NORCO/VICODIN) 5-325 MG tablet hydrocodone 5 mg-acetaminophen 325 mg tablet  TAKE 1 TABLET 3 TIMES A DAY BY ORAL ROUTE AS NEEDED.    [provider]  lamoTRIgine (LAMICTAL) 200 MG tablet TAKE 1 TABLET BY MOUTH EVERY DAY 02/15/20   Mullis, Kiersten P, DO  losartan (COZAAR) 25 MG tablet TAKE 1 TABLET BY MOUTH EVERYDAY AT BEDTIME 08/05/20   Maness, Philip, MD  nicotine (NICODERM CQ - DOSED IN MG/24 HOURS) 14 mg/24hr patch Place 1 patch (14 mg total) onto the skin daily. 12/01/18   Benay Pike, MD  nicotine polacrilex (NICORETTE) 2 MG gum Take 1 each (2 mg total) by mouth as needed for smoking cessation. 12/01/18   Benay Pike, MD  traZODone (DESYREL) 100 MG tablet TAKE 1 TABLET BY MOUTH EVERYDAY AT BEDTIME 01/24/20   Mullis, Archie Endo, DO    Physical Exam: BP (!) 174/149 (BP Location: Left Arm)   Pulse (!) 108   Temp 98.8 F (37.1 C) (Oral)   Resp 20   Ht '5\' 9"'$  (1.753 m)   Wt 70 kg   SpO2 99%   BMI 22.79 kg/m   General: 55 y.o. year-old female well developed well nourished in no acute distress.  Alert and oriented x3. HEENT: NCAT, EOMI Neck: Supple, trachea medial Cardiovascular: Regular rate and rhythm with no rubs or gallops.  No thyromegaly or JVD noted.  No lower extremity edema. 2/4 pulses in all 4 extremities. Respiratory: Clear to auscultation with no wheezes or rales. Good inspiratory effort. Abdomen: Soft, nontender nondistended with normal bowel sounds x4 quadrants. Muskuloskeletal: No cyanosis, clubbing or edema noted bilaterally Neuro: CN II-XII intact, grip strength 5/5 x 4, sensation, reflexes intact Skin: No ulcerative lesions noted or rashes Psychiatry:  Judgement and insight appear normal. Mood is appropriate for condition and setting          Labs on Admission:  Basic Metabolic Panel: Recent Labs  Lab 08/15/20 1637  NA 138  K 3.4*  CL 106  CO2 26  GLUCOSE 106*  BUN 21*  CREATININE 1.16*  CALCIUM 8.6*   Liver Function Tests: Recent Labs  Lab 08/15/20 1637  AST 13*  ALT 16  ALKPHOS 64  BILITOT 0.6  PROT 6.6  ALBUMIN 3.6   No results for input(s): LIPASE, AMYLASE in the last 168 hours. No results for input(s): AMMONIA in the last 168 hours. CBC: Recent Labs  Lab 08/15/20 1637  WBC 16.4*  NEUTROABS 5.6  HGB 14.3  HCT 44.1  MCV 86.3  PLT 317   Cardiac Enzymes: No results for input(s): CKTOTAL, CKMB, CKMBINDEX, TROPONINI in the last 168 hours.  BNP (last 3 results) No results for input(s): BNP in the last 8760 hours.  ProBNP (last 3 results) No results for input(s): PROBNP in the last 8760 hours.  CBG:  No results for input(s): GLUCAP in the last 168 hours.  Radiological Exams on Admission: CT Head Wo Contrast  Result Date: 08/15/2020 CLINICAL DATA:  Transient ischemic attack (TIA) EXAM: CT HEAD WITHOUT CONTRAST TECHNIQUE: Contiguous axial images were obtained from the base of the skull through the vertex without intravenous contrast. COMPARISON:  None. FINDINGS: Brain: No evidence of acute large vascular territory infarction, hemorrhage, hydrocephalus, extra-axial collection or mass lesion/mass effect. Predominately periventricular white matter hypodensities, advanced for age. Vascular: No hyperdense vessel identified. Skull: No acute fracture. Sinuses/Orbits: Visualized sinuses are clear. No acute orbital findings. Other: No mastoid effusions. IMPRESSION: Age advanced predominately periventricular white matter hypodensities, which could be secondary to chronic microvascular ischemic disease or demyelination. Given the patient's neurologic symptoms, recommend MRI with contrast to further evaluate. Electronically  Signed   By: Margaretha Sheffield MD   On: 08/15/2020 16:26    EKG: I independently viewed the EKG done and my findings are as followed: Normal sinus rhythm at a rate of 89 bpm  Assessment/Plan Present on Admission:  TIA (transient ischemic attack)  Bipolar 1 disorder ([place])  Tobacco abuse  HTN (hypertension)  Thoracic back pain  Principal Problem:   TIA (transient ischemic attack) Active Problems:   Kidney donor   Bipolar 1 disorder ([place])   Tobacco abuse   HTN (hypertension)   Thoracic back pain   Leukocytosis   Hypokalemia   Hyperlipidemia  Transient ischemic attack Patient will be admitted to telemetry unit  Echocardiogram in the morning MRI of brain without contrast in the morning MRI cervical spine with and without contrast will be done Continue aspirin and statin Continue fall precautions and neuro checks Lipid panel and hemoglobin A1c will be checked Continue PT/OT eval and treat Bedside swallow eval by nursing prior to diet Neurology was consulted and will consult with patient when patient [facility]  Hypokalemia K+ 3.4, K+ will be replenished Please monitor for AM K+ for further replenishmemnt  Leukocytosis possibly reactive WBC 16.4; no obvious sign for any acute infectious process at this time Continue to monitor WBC with morning labs  Thoracic back pain Patient complained of right arm weakness with numbness of right index finger and thumb which occurred after receiving nerve blockade injection due to back pain related to her scoliosis She has had several episodes of this episode in the past without this complaint.  Patient is currently following up with her orthopedic surgeon, she states that he is currently taking prednisone (today is day 6) for this. Continue PT/OT eval and treat  Essential hypertension Permissive hypertension will be allowed at this time  Hyperlipidemia Continue atorvastatin  Tobacco abuse Patient counseled on tobacco  abuse cessation Temperature be given to the patient  Bipolar 1 disorder Continue Lamictal and trazodone per home regimen  Kidney donor Patient only has 1 kidney, she was a kidney donor   DVT prophylaxis: SCDs (consider starting chemoprophylaxis based on findings on MRI)  Code Status: Full code  Family Communication: None at bedside  Disposition Plan:  Patient is from:                        home Anticipated DC to:                   SNF or family members home Anticipated DC date:               2-3 days Anticipated DC barriers:  patient requires inpatient management for further stroke work-up and pending neurology consult  Consults called: Neurology  Admission status: Observation    Bernadette Hoit MD [facility]  08/15/2020, 8:12 PM

## 2020-08-16 ENCOUNTER — Observation Stay (HOSPITAL_COMMUNITY): Payer: Medicare Other

## 2020-08-16 ENCOUNTER — Other Ambulatory Visit (HOSPITAL_COMMUNITY): Payer: Medicare Other

## 2020-08-16 DIAGNOSIS — M5021 Other cervical disc displacement,  high cervical region: Secondary | ICD-10-CM | POA: Diagnosis not present

## 2020-08-16 DIAGNOSIS — I639 Cerebral infarction, unspecified: Secondary | ICD-10-CM

## 2020-08-16 DIAGNOSIS — I10 Essential (primary) hypertension: Secondary | ICD-10-CM | POA: Diagnosis not present

## 2020-08-16 DIAGNOSIS — M50221 Other cervical disc displacement at C4-C5 level: Secondary | ICD-10-CM | POA: Diagnosis not present

## 2020-08-16 DIAGNOSIS — D72829 Elevated white blood cell count, unspecified: Secondary | ICD-10-CM | POA: Diagnosis not present

## 2020-08-16 DIAGNOSIS — E782 Mixed hyperlipidemia: Secondary | ICD-10-CM | POA: Diagnosis not present

## 2020-08-16 DIAGNOSIS — R29818 Other symptoms and signs involving the nervous system: Secondary | ICD-10-CM | POA: Diagnosis not present

## 2020-08-16 DIAGNOSIS — I6389 Other cerebral infarction: Secondary | ICD-10-CM

## 2020-08-16 DIAGNOSIS — M47812 Spondylosis without myelopathy or radiculopathy, cervical region: Secondary | ICD-10-CM | POA: Diagnosis not present

## 2020-08-16 DIAGNOSIS — M4802 Spinal stenosis, cervical region: Secondary | ICD-10-CM | POA: Diagnosis not present

## 2020-08-16 LAB — PROTIME-INR
INR: 0.9 (ref 0.8–1.2)
Prothrombin Time: 12.5 seconds (ref 11.4–15.2)

## 2020-08-16 LAB — LIPID PANEL
Cholesterol: 160 mg/dL (ref 0–200)
HDL: 55 mg/dL (ref >40)
LDL Cholesterol: 81 mg/dL (ref 0–99)
Total CHOL/HDL Ratio: 2.9 RATIO
Triglycerides: 119 mg/dL (ref <150)
VLDL: 24 mg/dL (ref 0–40)

## 2020-08-16 LAB — COMPREHENSIVE METABOLIC PANEL
ALT: 17 U/L (ref 0–44)
AST: 14 U/L — ABNORMAL LOW (ref 15–41)
Albumin: 3.6 g/dL (ref 3.5–5.0)
Alkaline Phosphatase: 62 U/L (ref 38–126)
Anion gap: 7 (ref 5–15)
BUN: 22 mg/dL — ABNORMAL HIGH (ref 6–20)
CO2: 26 mmol/L (ref 22–32)
Calcium: 9.1 mg/dL (ref 8.9–10.3)
Chloride: 104 mmol/L (ref 98–111)
Creatinine, Ser: 0.99 mg/dL (ref 0.44–1.00)
GFR, Estimated: >60 mL/min (ref >60)
Glucose, Bld: 86 mg/dL (ref 70–99)
Potassium: 4.1 mmol/L (ref 3.5–5.1)
Sodium: 137 mmol/L (ref 135–145)
Total Bilirubin: 0.7 mg/dL (ref 0.3–1.2)
Total Protein: 6.8 g/dL (ref 6.5–8.1)

## 2020-08-16 LAB — HEMOGLOBIN A1C
Hgb A1c MFr Bld: 6.1 % — ABNORMAL HIGH (ref 4.8–5.6)
Mean Plasma Glucose: 128.37 mg/dL

## 2020-08-16 LAB — CBC
HCT: 45.1 % (ref 36.0–46.0)
Hemoglobin: 14.3 g/dL (ref 12.0–15.0)
MCH: 27.4 pg (ref 26.0–34.0)
MCHC: 31.7 g/dL (ref 30.0–36.0)
MCV: 86.4 fL (ref 80.0–100.0)
Platelets: 308 10*3/uL (ref 150–400)
RBC: 5.22 MIL/uL — ABNORMAL HIGH (ref 3.87–5.11)
RDW: 14.9 % (ref 11.5–15.5)
WBC: 13 10*3/uL — ABNORMAL HIGH (ref 4.0–10.5)
nRBC: 0 % (ref 0.0–0.2)

## 2020-08-16 LAB — ECHOCARDIOGRAM COMPLETE
Area-P 1/2: 3.39 cm2
S' Lateral: 2.9 cm

## 2020-08-16 LAB — HIV ANTIBODY (ROUTINE TESTING W REFLEX): HIV Screen 4th Generation wRfx: NONREACTIVE

## 2020-08-16 LAB — APTT: aPTT: 28 seconds (ref 24–36)

## 2020-08-16 LAB — PHOSPHORUS: Phosphorus: 4.5 mg/dL (ref 2.5–4.6)

## 2020-08-16 LAB — MAGNESIUM: Magnesium: 2.1 mg/dL (ref 1.7–2.4)

## 2020-08-16 MED ORDER — TRAZODONE HCL 100 MG PO TABS
100.0000 mg | ORAL_TABLET | Freq: Every day | ORAL | Status: DC
  Administered 2020-08-16 – 2020-08-20 (×5): 100 mg via ORAL
  Filled 2020-08-16 (×5): qty 1

## 2020-08-16 MED ORDER — GADOBUTROL 1 MMOL/ML IV SOLN
7.5000 mL | Freq: Once | INTRAVENOUS | Status: AC | PRN
  Administered 2020-08-16: 7.5 mL via INTRAVENOUS

## 2020-08-16 MED ORDER — PREDNISONE 20 MG PO TABS
10.0000 mg | ORAL_TABLET | Freq: Every day | ORAL | Status: AC
  Administered 2020-08-17: 10 mg via ORAL
  Filled 2020-08-16: qty 1

## 2020-08-16 MED ORDER — ALBUTEROL SULFATE HFA 108 (90 BASE) MCG/ACT IN AERS: 2.0000 | INHALATION_SPRAY | Freq: Four times a day (QID) | RESPIRATORY_TRACT | Status: DC | PRN

## 2020-08-16 MED ORDER — PREDNISONE 20 MG PO TABS
20.0000 mg | ORAL_TABLET | Freq: Every day | ORAL | Status: AC
  Administered 2020-08-16: 20 mg via ORAL
  Filled 2020-08-16: qty 1

## 2020-08-16 MED ORDER — PREDNISONE 10 MG (21) PO TBPK: 10.0000 mg | ORAL_TABLET | ORAL | Status: DC

## 2020-08-16 MED ORDER — ALBUTEROL SULFATE (2.5 MG/3ML) 0.083% IN NEBU: 2.5000 mg | INHALATION_SOLUTION | Freq: Four times a day (QID) | RESPIRATORY_TRACT | Status: DC | PRN

## 2020-08-16 MED ORDER — ATORVASTATIN CALCIUM 40 MG PO TABS
40.0000 mg | ORAL_TABLET | Freq: Every day | ORAL | Status: DC
  Administered 2020-08-16: 40 mg via ORAL
  Filled 2020-08-16: qty 1

## 2020-08-16 MED ORDER — LAMOTRIGINE 25 MG PO TABS
200.0000 mg | ORAL_TABLET | Freq: Every day | ORAL | Status: DC
  Administered 2020-08-16 – 2020-08-21 (×4): 200 mg via ORAL
  Filled 2020-08-16 (×3): qty 2
  Filled 2020-08-16: qty 8

## 2020-08-16 MED ORDER — CLOPIDOGREL BISULFATE 75 MG PO TABS
75.0000 mg | ORAL_TABLET | Freq: Every day | ORAL | Status: DC
  Administered 2020-08-16 – 2020-08-21 (×5): 75 mg via ORAL
  Filled 2020-08-16 (×5): qty 1

## 2020-08-16 MED ORDER — ATORVASTATIN CALCIUM 80 MG PO TABS
80.0000 mg | ORAL_TABLET | Freq: Every day | ORAL | Status: DC
  Administered 2020-08-17 – 2020-08-21 (×4): 80 mg via ORAL
  Filled 2020-08-16: qty 1
  Filled 2020-08-16: qty 2
  Filled 2020-08-16 (×2): qty 1

## 2020-08-16 NOTE — Evaluation (Signed)
Occupational Therapy Evaluation Patient Details Name: [name] MRN: [id number] DOB: Jun 01, 1965 Today's Date: 08/16/2020    History of Present Illness Pt admitted to ED with difficulty speaking and possible TIA.  Pt with hx of Bipolar, CKD, and PE, scoliosis    Clinical Impression   [name] is a 55 year old woman who presented to ED with speech deficits that have currently resolved. On evaluation she exhibits decreased coordination, ROM and strength of right hand - predominantly with thumb and index finger but also exhibits deficits with abduction of fingers. However right hand is functional. She reports this happened after receiving a thoracic injection into her back and her neurologist is aware and planning testing. Of note she does have decreased strength with forearm supination - but once again strength is functional. Overall patient is independent with ADLs and with mobility. Patient has no OT needs.    Follow Up Recommendations  No OT follow up    Equipment Recommendations  None recommended by OT    Recommendations for Other Services       Precautions / Restrictions Precautions Precautions: None Restrictions Weight Bearing Restrictions: No      Mobility Bed Mobility Overal bed mobility: Independent                  Transfers Overall transfer level: Independent                    Balance Overall balance assessment: No apparent balance deficits (not formally assessed)                                         ADL either performed or assessed with clinical judgement   ADL Overall ADL's : Modified independent                                       General ADL Comments: Increased time due to coordination deficits of right hand.     Vision Patient Visual Report: No change from baseline       Perception     Praxis      Pertinent Vitals/Pain Pain Assessment: No/denies pain     Hand Dominance  Right   Extremity/Trunk Assessment Upper Extremity Assessment Upper Extremity Assessment: Defer to OT evaluation RUE Deficits / Details: WFL ROM, decreased thumb and 1st finger coordination but otherwise functional, 5/5 shoulder, elbow and wrist strength. 4/5 supination strength. RUE Sensation: WNL RUE Coordination: decreased fine motor (able to grossly perform finger to thumb, no ataxia with finger to nose.) LUE Deficits / Details: WFL ROM, 5/5 strength except 4-/5 supination of forearm. LUE Sensation: WNL LUE Coordination: WNL   Lower Extremity Assessment Lower Extremity Assessment: Overall WFL for tasks assessed (Slight decreased strength in R LE vs L LE but with ROM, coordination and sensation intact.)   Cervical / Trunk Assessment Cervical / Trunk Assessment: Normal   Communication Communication Communication: No difficulties   Cognition Arousal/Alertness: Awake/alert Behavior During Therapy: WFL for tasks assessed/performed Overall Cognitive Status: Within Functional Limits for tasks assessed                                     General Comments  Exercises     Shoulder Instructions      Home Living Family/patient expects to be discharged to:: Private residence Living Arrangements: Alone Available Help at Discharge: Available PRN/intermittently Type of Home: House Home Access: Level entry     Home Layout: Two level     Bathroom Shower/Tub: Occupational psychologist: Standard Bathroom Accessibility: Yes   Home Equipment: Environmental consultant - 2 wheels;Shower seat;Bedside commode          Prior Functioning/Environment Level of Independence: Independent        Comments: works as a Psychiatrist List: Decreased coordination      OT Treatment/Interventions:      OT Goals(Current goals can be found in the care plan section) Acute Rehab OT Goals Patient Stated Goal: HOME OT Goal Formulation: All assessment and  education complete, DC therapy  OT Frequency:     Barriers to D/C:            Co-evaluation PT/OT/SLP Co-Evaluation/Treatment: Yes Reason for Co-Treatment: To address functional/ADL transfers;For patient/therapist safety PT goals addressed during session: Mobility/safety with mobility OT goals addressed during session: ADL's and self-care      AM-PAC OT "6 Clicks" Daily Activity     Outcome Measure Help from another person eating meals?: None Help from another person taking care of personal grooming?: None Help from another person toileting, which includes using toliet, bedpan, or urinal?: None Help from another person bathing (including washing, rinsing, drying)?: None Help from another person to put on and taking off regular upper body clothing?: None Help from another person to put on and taking off regular lower body clothing?: None 6 Click Score: 24   End of Session Nurse Communication: Mobility status  Activity Tolerance: Patient tolerated treatment well Patient left: in bed  OT Visit Diagnosis: Other symptoms and signs involving the nervous system (R29.898)                [id number] OT Time Calculation (min): 15 min Charges:  OT General Charges $OT Visit: 1 Visit OT Evaluation $OT Eval Low Complexity: 1 Low  Vedant Shehadeh, OTR/L [place]  Office [phone] Pager: [place] 08/16/2020, 10:39 AM

## 2020-08-16 NOTE — Consult Note (Signed)
NEUROLOGY CONSULTATION NOTE   Date of service: August 16, 2020 Patient Name: [name] MRN:  [id number] DOB:  [birth year] Reason for consult: "aphasia, stroke on MRI" Requesting Provider: Annita Brod, MD _ _ _   _ __   _ __ _ _  __ __   _ __   __ _  History of Present Illness  [name] is a 55 y.o. female with PMH significant for Bipolar 1, CKD, HTN, prior hx of PE who presents with an episode of acute onset aphasia and garbled speech. The episode lasted approximately 30 mins with resolution.  Initial CTH was negative. MRI Brain was obtained and was notable for an acute small left frontal lobe infarct.  She endorses that over the last week, she has had R thumb and index finger weakness wiuth weakness with R hand grip. She attributes this to having a nerve block on July 28th. She woke up the next day with these symptoms.  She smokes 20 cigarettes a day and her mom had strokes.   ROS   Constitutional Denies weight loss, fever and chills.   HEENT Denies changes in vision and hearing.   Respiratory Denies SOB and cough.   CV Denies palpitations and CP   GI Denies abdominal pain, nausea, vomiting and diarrhea.   GU Denies dysuria and urinary frequency.   MSK Denies myalgia and joint pain.   Skin Denies rash and pruritus.   Neurological Denies headache and syncope.   Psychiatric Denies recent changes in mood. Denies anxiety and depression.    Past History   Past Medical History:  Diagnosis Date   Bipolar 1 disorder ([place])    Chronic kidney disease    donated left kidney   Hypertension    Pneumonia 2010   had 2 episodes/ was sent home on O2   Pulmonary embolism ([place]) 1994   Only 1 clot, not on lifelong anticoagulation   Past Surgical History:  Procedure Laterality Date   APPENDECTOMY  2001   Endometriosis s/p left fallopian tube removal.  2001   NEPHRECTOMY LIVING DONOR  1993   Donated kidney to sister   TONSILLECTOMY     Family History  Problem  Relation Age of Onset   Breast cancer Mother    Breast cancer Maternal Grandmother    Diabetes Sister    Kidney disease Sister    Colon cancer Paternal Grandmother    Breast cancer Maternal Aunt    Social History   Socioeconomic History   Marital status: Divorced    Spouse name: Not on file   Number of children: Not on file   Years of education: Not on file   Highest education level: Not on file  Occupational History   Not on file  Tobacco Use   Smoking status: Every Day    Packs/day: 0.50    Years: 20.00    Pack years: 10.00    Types: Cigarettes   Smokeless tobacco: Never  Vaping Use   Vaping Use: Some days  Substance and Sexual Activity   Alcohol use: No    Alcohol/week: 0.0 standard drinks   Drug use: No   Sexual activity: Yes    Birth control/protection: Condom    Comment: Partner with Vasectomy  Other Topics Concern   Not on file  Social History Narrative   Not on file   Social Determinants of Health   Financial Resource Strain: Not on file  Food Insecurity: Not on file  Transportation Needs:  Not on file  Physical Activity: Not on file  Stress: Not on file  Social Connections: Not on file   Allergies  Allergen Reactions   Iohexol Hives    Hives 06/06/04, needs pre meds    Morphine And Related Hives and Itching    Denies Airway involvement   Sulfa Antibiotics Itching and Rash    Reported with TMP/ SMX  Denies Airway involvement    Medications  (Not in a hospital admission)    Vitals   Vitals:   08/16/20 0700 08/16/20 0900 08/16/20 1321 08/16/20 1706  BP: (!) 148/64 (!) 145/47 124/61 (!) 142/70  Pulse: 78 91 79 84  Resp: '18 17 16 18  '$ Temp:      TempSrc:      SpO2: 97% 95% 95% 97%  Weight:      Height:         Body mass index is 22.79 kg/m.  Physical Exam   General: Laying comfortably in bed; in no acute distress.  HENT: Normal oropharynx and mucosa. Normal external appearance of ears and nose.  Neck: Supple, no pain or tenderness   CV: No JVD. No peripheral edema.  Pulmonary: Symmetric Chest rise. Normal respiratory effort.  Abdomen: Soft to touch, non-tender.  Ext: No cyanosis, edema, or deformity  Skin: No rash. Normal palpation of skin.   Musculoskeletal: Normal digits and nails by inspection. No clubbing.   Neurologic Examination  Mental status/Cognition: Alert, oriented to self, place, month and year, good attention.  Speech/language: Fluent, comprehension intact, object naming intact, repetition intact.  Cranial nerves:   CN II Pupils equal and reactive to light, no VF deficits    CN III,IV,VI EOM intact, no gaze preference or deviation, no nystagmus    CN V normal sensation in V1, V2, and V3 segments bilaterally    CN VII no asymmetry, no nasolabial fold flattening    CN VIII normal hearing to speech    CN IX & X normal palatal elevation, no uvular deviation    CN XI 5/5 head turn and 5/5 shoulder shrug bilaterally    CN XII midline tongue protrusion    Motor:  Muscle bulk: normal, tone normal, pronator drift none tremor none Mvmt Root Nerve  Muscle Right Left Comments  SA C5/6 Ax Deltoid 5 5   EF C5/6 Mc Biceps 5 5   EE C6/7/8 Rad Triceps 5 5   WF C6/7 Med FCR     WE C7/8 PIN ECU     F Ab C8/T1 U ADM/FDI 3 5   HF L1/2/3 Fem Illopsoas 5 5   KE L2/3/4 Fem Quad 5 5   DF L4/5 D Peron Tib Ant 5 5   PF S1/2 Tibial Grc/Sol 5 5    Reflexes:  Right Left Comments  Pectoralis      Biceps (C5/6) 2 2   Brachioradialis (C5/6) 2 2    Triceps (C6/7) 2 2    Patellar (L3/4) 2 2    Achilles (S1)      Hoffman      Plantar     Jaw jerk    Sensation:  Light touch intact   Pin prick    Temperature    Vibration   Proprioception    Coordination/Complex Motor:  - Finger to Nose intact BL - Heel to shin intact BL - Rapid alternating movement are slowed in RUE - Gait: Stride length normal. Arm swing normal. Base width narrow.  Labs   CBC:  Recent  Labs  Lab 08/15/20 1637 08/16/20 0754  WBC 16.4*  13.0*  NEUTROABS 5.6  --   HGB 14.3 14.3  HCT 44.1 45.1  MCV 86.3 86.4  PLT 317 [id number]    Basic Metabolic Panel:  Lab Results  Component Value Date   NA 137 08/16/2020   K 4.1 08/16/2020   CO2 26 08/16/2020   GLUCOSE 86 08/16/2020   BUN 22 (H) 08/16/2020   CREATININE 0.99 08/16/2020   CALCIUM 9.1 08/16/2020   GFRNONAA >60 08/16/2020   GFRAA 68 11/26/2019   Lipid Panel:  Lab Results  Component Value Date   LDLCALC 81 08/16/2020   HgbA1c:  Lab Results  Component Value Date   HGBA1C 6.1 (H) 08/16/2020   Urine Drug Screen:     Component Value Date/Time   LABOPIA NEGATIVE 03/07/2007 1631   COCAINSCRNUR NEGATIVE 03/07/2007 1631   LABBENZ NEGATIVE 03/07/2007 1631   AMPHETMU NEGATIVE 03/07/2007 1631    Alcohol Level No results found for: [place]  CT Head without contrast: Personally reviewed and CTH was negative for a large hypodensity concerning for a large territory infarct or hyperdensity concerning for an ICH  MR Angio head without contrast and Carotid Duplex BL: pending  MRI Brain: 1. Small foci of restricted diffusion and enhancement in the left frontal lobe most compatible with acute to early subacute MCA infarcts (as opposed to acute demyelination). 2. Advanced chronic white matter disease which may reflect demyelinating disease, chronic small vessel ischemia, or vasculitis.  Impression   [name] is a 55 y.o. female with PMH significant for Bipolar 1, CKD, HTN, prior hx of PE who presents with an episode of acute onset aphasia and garbled speech. The episode lasted approximately 30 mins with resolution. She also reported a 1 week history of weakness in her R hand specifically her thumb and the index fingers without and sensory deficit. Her neurologic examination is notable for R hand weakness. I suspect that her episode yesterday with aphasia was a TIA and her episode of R hand weakness last week is probably due to the acute/early subacute L frontal cortical  stroke.   The etiology of her stroke is not obvious yet but the vessel imaging is pending. I would definitely get vessel imaging to evaluate for any potential high grade stenosis or plaque in the left ICA given both of her episodes over the last week could be traced to L MCA territory. She is allergic to Iodinated contrast so would recommend getting MRA and US Carotid duplex. Also important to note that her stroke is cortical and may potentially be embolic.  Primary Diagnosis:  Cerebral infarction due to embolism of  left middle cerebral artery.   Secondary Diagnosis: Essential (primary) hypertension, Obesity, and CKD Stage 3 (GFR 30-59)  Recommendations  Plan:   - Frequent Neuro checks per stroke unit protocol - I ordered Vascular imaging with MRA Angio Head without contrast and US Carotid doppler - I ordered limited TTE with bubble study - Agree with Atorvastatin increase to '80mg'$  daily. - HbA1c is elevated to 6.1. - Antithrombotic - Aspirin '81mg'$  daily along with plavix '75mg'$  daily for 21 days(ordered), followed by aspirin '81mg'$  daily alone. - Recommend DVT ppx - SBP goal - permissive hypertension first 24 h < 220/110. Held home meds.  - Recommend Telemetry monitoring for arrythmia - Recommend bedside swallow screen prior to PO intake. - Stroke education booklet - Recommend PT/OT/SLP consult  - If the workup above is non revealing, she will  need a TEE and an outpatient 4 week cardiac event monitor.  Discussed with patient and her daughter at bedside and answered all of their questions.  ______________________________________________________________________   Thank you for the opportunity to take part in the care of this patient. If you have any further questions, please contact the neurology consultation attending.  Signed,  [place] Pager Number [phone] _ _ _   _ __   _ __ _ _  __ __   _ __   __ _

## 2020-08-16 NOTE — Progress Notes (Signed)
  Echocardiogram 2D Echocardiogram has been performed.  [name] G Ensley Blas 08/16/2020, 2:12 PM

## 2020-08-16 NOTE — Evaluation (Addendum)
Physical Therapy Evaluation Patient Details Name: [name] MRN: [id number] DOB: December 02, 1965 Today's Date: 08/16/2020   History of Present Illness  Pt admitted to ED with difficulty speaking and possible TIA.  Pt with hx of Bipolar, CKD, and PE  Clinical Impression  Pt admitted as above and presenting as IND with basic mobility tasks and at/near baseline level of function including LE strength, sensation, and coordination; as well as balance and performance of basic functional activities.  No PT needs at this time and PT service will sign off.    Follow Up Recommendations No PT follow up    Equipment Recommendations  None recommended by PT    Recommendations for Other Services       Precautions / Restrictions Precautions Precautions: None Restrictions Weight Bearing Restrictions: No      Mobility  Bed Mobility Overal bed mobility: Independent                  Transfers Overall transfer level: Independent                  Ambulation/Gait Ambulation/Gait assistance: Independent Gait Distance (Feet): 300 Feet Assistive device: None Gait Pattern/deviations: WFL(Within Functional Limits) Gait velocity: WFL   General Gait Details: No instability and no LOB including stepping back, side-stepping, stork standing, quick stop/turn.  Stairs            Wheelchair Mobility    Modified Rankin (Stroke Patients Only)       Balance Overall balance assessment: Independent                                           Pertinent Vitals/Pain Pain Assessment: No/denies pain    Home Living Family/patient expects to be discharged to:: Private residence Living Arrangements: Alone Available Help at Discharge: Available PRN/intermittently Type of Home: House Home Access: Level entry     Home Layout: Two level Home Equipment: Environmental consultant - 2 wheels;Shower seat;Bedside commode      Prior Function Level of Independence: Independent          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 4/5 supination strength. RUE Sensation: WNL RUE Coordination: decreased fine motor (able to grossly perform finger to thumb, no ataxia with finger to nose.) LUE Deficits / Details: WFL ROM, 5/5 strength except 4-/5 supination of forearm. LUE Sensation: WNL LUE Coordination: WNL    Lower Extremity Assessment Lower Extremity Assessment: Overall WFL for tasks assessed (Slight decreased strength in R LE vs L LE but with ROM, coordination and sensation intact.)    Cervical / Trunk Assessment Cervical / Trunk Assessment: Normal  Communication   Communication: No difficulties  Cognition Arousal/Alertness: Awake/alert Behavior During Therapy: WFL for tasks assessed/performed Overall Cognitive Status: Within Functional Limits for tasks assessed                                        General Comments      Exercises     Assessment/Plan    PT Assessment Patent  does not need any further PT services  PT Problem List         PT Treatment Interventions      PT Goals (Current goals can be found in the Care Plan section)  Acute Rehab PT Goals Patient Stated Goal: HOME PT Goal Formulation: All assessment and education complete, DC therapy    Frequency     Barriers to discharge        Co-evaluation PT/OT/SLP Co-Evaluation/Treatment: Yes Reason for Co-Treatment: To address functional/ADL transfers PT goals addressed during session: Mobility/safety with mobility OT goals addressed during session: ADL's and self-care       AM-PAC PT "6 Clicks" Mobility  Outcome Measure Help needed turning from your back to your side while in a  flat bed without using bedrails?: None Help needed moving from lying on your back to sitting on the side of a flat bed without using bedrails?: None Help needed moving to and from a bed to a chair (including a wheelchair)?: None Help needed standing up from a chair using your arms (e.g., wheelchair or bedside chair)?: None Help needed to walk in hospital room?: None Help needed climbing 3-5 steps with a railing? : None 6 Click Score: 24    End of Session Equipment Utilized During Treatment: Gait belt Activity Tolerance: Patient tolerated treatment well Patient left: in bed Nurse Communication: Mobility status PT Visit Diagnosis: Difficulty in walking, not elsewhere classified (R26.2)    Time: [id number] PT Time Calculation (min) (ACUTE ONLY): 16 min   Charges:   PT Evaluation $PT Eval Low Complexity: 1 Low          Debe Coder PT Acute Rehabilitation Services Pager [phone] Office [phone]   [name] 08/16/2020, 10:09 AM

## 2020-08-16 NOTE — Progress Notes (Signed)
PROGRESS NOTE  [name] [id number] DOB: 1965-01-17 DOA: 08/15/2020 PCP: Delora Fuel, MD  HPI/Recap of past 87 hours: 55 year old female with past medical history for hyperlipidemia, hypertension, bipolar disorder and tobacco abuse presented to the emergency room on 8/5 with sudden onset of word finding difficulty.  This lasted for approximately less than 30 minutes.  Upon arrival to emergency room symptoms have resolved.  No other signs of weakness.  In the emergency room, noted to be hypertensive with a systolic of [id number], minimal acute kidney injury with a creatinine of 1.16 and white blood count of 16.4.  Initial CT unremarkable and patient brought in for stroke versus TIA work-up.  MRI done on 8/6 notes multiple small acute versus subacute CVA in left frontal lobe.  Echocardiogram unrevealing.  Patient awaiting transfer to [facility] where she will be seen by neurology.  Patient doing okay with no complaints.  Dysarthric speech has resolved.  Denies any weakness.  Continues to have occasional intermittent issues of right arm numbness, tingling going to right finger, but this been going on for a while since her last thoracic nerve block.  Assessment/Plan: Principal Problem:   Acute CVA (cerebrovascular accident) [facility]): Have ordered carotid Dopplers which are pending.  Echo unremarkable.  LDL at 80 which is above goal for acute CVA.  Blood pressure overall has improved even allowing for permissive hypertension.  Seen by PT and OT who have signed off.  Speech therapy with no deficits.  Interestingly, acute CVA seen in left frontal lobe although her complaints of her dysarthric speech which is more temporal lobe nature.  Suspect embolic disease.  Have ordered carotid Dopplers which are pending.  If these are negative, patient may benefit from 30-day event monitor. Active Problems:   Kidney donor   Bipolar 1 disorder ([place]): Stable continue Lamictal.    Tobacco abuse: Continue nicotine  patch.  Heavily counseled patient to quit.    HTN (hypertension): Blood pressure overall improved, even allowing for permissive hypertension   Thoracic back pain: Patient complains of some left hand finger numbness after steroid injection in the past.  Cervical CT notes some mild to moderate cervical stenosis but not necessarily coordinating with her symptoms.    Leukocytosis: No signs of infection.  White blood cell count down to 13 today.  May be stress margination due to acute CVA.    Hypokalemia: Replaced as needed.  Potassium today normal.    Hyperlipidemia: Already on Lipitor 40.  Increased to 80.  LDL at 80 which is above goal for acute CVA   Code Status: Full code  Family Communication: Daughter at the bedside  Disposition Plan: Potential discharge in next 24 hours after being evaluated by neurology.  Need to see what vascular Dopplers look like given multiple CVAs   Consultants: Neurology  Procedures: Carotid Dopplers are pending Echocardiogram: Preserved ejection fraction, no evidence of diastolic dysfunction  Antimicrobials: None  DVT prophylaxis: SCDs  Level of care: Telemetry Medical   Objective: Vitals:   08/16/20 0900 08/16/20 1321  BP: (!) 145/47 124/61  Pulse: 91 79  Resp: 17 16  Temp:    SpO2: 95% 95%   No intake or output data in the 24 hours ending 08/16/20 1448 Filed Weights   08/15/20 1543  Weight: 70 kg   Body mass index is 22.79 kg/m.  Exam:  General: Alert and oriented x3, no acute distress Cardiovascular: Regular rate and rhythm, S1-S2 Respiratory: Clear to auscultation bilaterally Abdomen: Soft, nontender, nondistended,  positive bowel sounds Musculoskeletal: No clubbing or cyanosis or edema, upper and lower extremities 5/5 for grip, flexion, extension and are equivocal Skin: No skin breaks, tears or lesions Psychiatry: Appropriate, no evidence of psychoses Neurology: Cranial nerves II through XII are intact.  No loss of sensation.   Downgoing toes.  Normal finger-to-nose bilaterally   Data Reviewed: CBC: Recent Labs  Lab 08/15/20 1637 08/16/20 0754  WBC 16.4* 13.0*  NEUTROABS 5.6  --   HGB 14.3 14.3  HCT 44.1 45.1  MCV 86.3 86.4  PLT 317 [id number]   Basic Metabolic Panel: Recent Labs  Lab 08/15/20 1637 08/16/20 0754  NA 138 137  K 3.4* 4.1  CL 106 104  CO2 26 26  GLUCOSE 106* 86  BUN 21* 22*  CREATININE 1.16* 0.99  CALCIUM 8.6* 9.1  MG  --  2.1  PHOS  --  4.5   GFR: Estimated Creatinine Clearance: 66.3 mL/min (by C-G formula based on SCr of 0.99 mg/dL). Liver Function Tests: Recent Labs  Lab 08/15/20 1637 08/16/20 0754  AST 13* 14*  ALT 16 17  ALKPHOS 64 62  BILITOT 0.6 0.7  PROT 6.6 6.8  ALBUMIN 3.6 3.6   No results for input(s): LIPASE, AMYLASE in the last 168 hours. No results for input(s): AMMONIA in the last 168 hours. Coagulation Profile: Recent Labs  Lab 08/16/20 0754  INR 0.9   Cardiac Enzymes: No results for input(s): CKTOTAL, CKMB, CKMBINDEX, TROPONINI in the last 168 hours. BNP (last 3 results) No results for input(s): PROBNP in the last 8760 hours. HbA1C: Recent Labs    08/16/20 0624  HGBA1C 6.1*   CBG: No results for input(s): GLUCAP in the last 168 hours. Lipid Profile: Recent Labs    08/16/20 0624  CHOL 160  HDL 55  LDLCALC 81  TRIG 119  CHOLHDL 2.9   Thyroid Function Tests: No results for input(s): TSH, T4TOTAL, FREET4, T3FREE, THYROIDAB in the last 72 hours. Anemia Panel: No results for input(s): VITAMINB12, FOLATE, FERRITIN, TIBC, IRON, RETICCTPCT in the last 72 hours. Urine analysis:    Component Value Date/Time   COLORURINE YELLOW 08/15/2020 1534   APPEARANCEUR CLEAR 08/15/2020 1534   LABSPEC 1.012 08/15/2020 1534   PHURINE 6.0 08/15/2020 1534   GLUCOSEU NEGATIVE 08/15/2020 1534   HGBUR MODERATE (A) 08/15/2020 1534   BILIRUBINUR NEGATIVE 08/15/2020 1534   BILIRUBINUR negative 11/23/2016 1020   KETONESUR NEGATIVE 08/15/2020 1534   PROTEINUR  100 (A) 08/15/2020 1534   UROBILINOGEN 0.2 11/23/2016 1020   UROBILINOGEN 0.2 11/06/2016 1220   NITRITE NEGATIVE 08/15/2020 1534   LEUKOCYTESUR MODERATE (A) 08/15/2020 1534   Sepsis Labs: '@LABRCNTIP'$ (procalcitonin:4,lacticidven:4)  ) Recent Results (from the past 240 hour(s))  Resp Panel by RT-PCR (Flu A&B, Covid) Nasopharyngeal Swab     Status: None   Collection Time: 08/15/20  8:06 PM   Specimen: Nasopharyngeal Swab; Nasopharyngeal(NP) swabs in vial transport medium  Result Value Ref Range Status   SARS Coronavirus 2 by RT PCR NEGATIVE NEGATIVE Final    Comment: (NOTE) SARS-CoV-2 target nucleic acids are NOT DETECTED.  The SARS-CoV-2 RNA is generally detectable in upper respiratory specimens during the acute phase of infection. The lowest concentration of SARS-CoV-2 viral copies this assay can detect is 138 copies/mL. A negative result does not preclude SARS-Cov-2 infection and should not be used as the sole basis for treatment or other patient management decisions. A negative result may occur with  improper specimen collection/handling, submission of specimen other than nasopharyngeal swab, presence  of viral mutation(s) within the areas targeted by this assay, and inadequate number of viral copies(<138 copies/mL). A negative result must be combined with clinical observations, patient history, and epidemiological information. The expected result is Negative.  Fact Sheet for Patients:  EntrepreneurPulse.com.au  Fact Sheet for Healthcare Providers:  IncredibleEmployment.be  This test is no t yet approved or cleared by the [place] FDA and  has been authorized for detection and/or diagnosis of SARS-CoV-2 by FDA under an Emergency Use Authorization (EUA). This EUA will remain  in effect (meaning this test can be used) for the duration of the COVID-19 declaration under Section 564(b)(1) of the Act, 21 U.S.C.section 360bbb-3(b)(1), unless  the authorization is terminated  or revoked sooner.       Influenza A by PCR NEGATIVE NEGATIVE Final   Influenza B by PCR NEGATIVE NEGATIVE Final    Comment: (NOTE) The Xpert Xpress SARS-CoV-2/FLU/RSV plus assay is intended as an aid in the diagnosis of influenza from Nasopharyngeal swab specimens and should not be used as a sole basis for treatment. Nasal washings and aspirates are unacceptable for Xpert Xpress SARS-CoV-2/FLU/RSV testing.  Fact Sheet for Patients: EntrepreneurPulse.com.au  Fact Sheet for Healthcare Providers: IncredibleEmployment.be  This test is not yet approved or cleared by the [place] FDA and has been authorized for detection and/or diagnosis of SARS-CoV-2 by FDA under an Emergency Use Authorization (EUA). This EUA will remain in effect (meaning this test can be used) for the duration of the COVID-19 declaration under Section 564(b)(1) of the Act, 21 U.S.C. section 360bbb-3(b)(1), unless the authorization is terminated or revoked.  Performed at [facility], [place] [address], [place], [place] [address]       Studies: CT Head Wo Contrast  Result Date: 08/15/2020 CLINICAL DATA:  Transient ischemic attack (TIA) EXAM: CT HEAD WITHOUT CONTRAST TECHNIQUE: Contiguous axial images were obtained from the base of the skull through the vertex without intravenous contrast. COMPARISON:  None. FINDINGS: Brain: No evidence of acute large vascular territory infarction, hemorrhage, hydrocephalus, extra-axial collection or mass lesion/mass effect. Predominately periventricular white matter hypodensities, advanced for age. Vascular: No hyperdense vessel identified. Skull: No acute fracture. Sinuses/Orbits: Visualized sinuses are clear. No acute orbital findings. Other: No mastoid effusions. IMPRESSION: Age advanced predominately periventricular white matter hypodensities, which could be secondary to chronic microvascular  ischemic disease or demyelination. Given the patient's neurologic symptoms, recommend MRI with contrast to further evaluate. Electronically Signed   By: Margaretha Sheffield MD   On: 08/15/2020 16:26   MR BRAIN W WO CONTRAST  Result Date: 08/16/2020 CLINICAL DATA:  Neuro deficit, acute, stroke suspected. Speech disturbance. EXAM: MRI HEAD WITHOUT AND WITH CONTRAST TECHNIQUE: Multiplanar, multiecho pulse sequences of the brain and surrounding structures were obtained without and with intravenous contrast. CONTRAST:  7.81m GADAVIST GADOBUTROL 1 MMOL/ML IV SOLN COMPARISON:  Head CT 08/15/2020 FINDINGS: Brain: There are small clustered foci of restricted diffusion involving cortex and subcortical white matter in the posterior left frontal lobe with a small amount of associated enhancement there is also a punctate focus of diffusion abnormality and enhancement involving subcortical white matter of the anterior left frontal lobe. No intracranial hemorrhage, mass, midline shift, or extra-axial fluid collection is identified. There is mild cerebral atrophy. There is widespread chronic cerebral white matter disease characterized by both small discrete and patchy foci of T2 FLAIR hyperintensity in the juxtacortical and deep cerebral white matter as well as more extensive and confluent T2 hyperintensity in the periventricular white matter including  involvement of the temporal lobes and with prominent T1 hypointensity. Some of these are oriented perpendicularly to the lateral ventricles. There also small T2 hyperintensities in both cerebellar hemispheres. The brainstem is normal in signal. Vascular: Major intracranial vascular flow voids are preserved. Skull and upper cervical spine: Unremarkable calvarial bone marrow signal. Cervical spine reported separately. Sinuses/Orbits: Unremarkable orbits. Paranasal sinuses and mastoid air cells are clear. Other: None. IMPRESSION: 1. Small foci of restricted diffusion and enhancement in  the left frontal lobe most compatible with acute to early subacute MCA infarcts (as opposed to acute demyelination). 2. Advanced chronic white matter disease which may reflect demyelinating disease, chronic small vessel ischemia, or vasculitis. Electronically Signed   By: Logan Bores M.D.   On: 08/16/2020 12:40   MR Cervical Spine W or Wo Contrast  Result Date: 08/16/2020 CLINICAL DATA:  New onset of inability to speak yesterday. EXAM: MRI CERVICAL SPINE WITHOUT AND WITH CONTRAST TECHNIQUE: Multiplanar and multiecho pulse sequences of the cervical spine, to include the craniocervical junction and cervicothoracic junction, were obtained without and with intravenous contrast. CONTRAST:  7.31m GADAVIST GADOBUTROL 1 MMOL/ML IV SOLN COMPARISON:  MRI cervical spine 04/14/2019 FINDINGS: Alignment: The overall alignment is maintained. Age advanced degenerative cervical spondylosis with mild multilevel degenerative subluxations. Moderate straightening of the cervical lordosis appears stable. Vertebrae: No acute bony findings or destructive bony changes. There are progressive endplate reactive changes at C3-4. No findings suspicious for discitis, osteomyelitis or epidural abscess. Cord: Normal cord signal intensity. No cord lesions, syrinx or abnormal areas of cord enhancement. Posterior Fossa, vertebral arteries, paraspinal tissues: No significant findings. Disc levels: C2-3: No significant findings. C3-4: Advanced and progressive degenerative disc disease with a bulging degenerated annulus, osteophytic ridging and uncinate spurring. There is flattening of the ventral thecal sac and obliteration of the ventral CSF space. There is also mild to moderate bilateral foraminal stenosis, left greater than right. C4-5: Advanced degenerative disc disease with a bulging degenerated annulus, osteophytic ridging and uncinate spurring. There is also asymmetric left-sided facet disease. Mass effect on the thecal sac asymmetric to the  right with narrowing the ventral CSF space. No significant foraminal stenosis. C5-6: Advanced degenerate disc disease with a bulging degenerated annulus, osteophytic ridging and uncinate spurring. Flattening of the ventral thecal sac and narrowing the ventral CSF space. Mild left foraminal encroachment. C6-7: Interbody fusion changes. Mild osteophytic ridging but no spinal or foraminal stenosis. C7-T1: Uncinate spurring changes with mild foraminal encroachment bilaterally, left greater than right. Significant disc disease noted in the upper thoracic spine. IMPRESSION: 1. Age advanced degenerative cervical spondylosis with multilevel disc disease and facet disease as detailed above. 2. No cord lesions, syrinx or abnormal cord enhancement. 3. No findings suspicious for discitis, osteomyelitis or epidural abscess. 4. Mild to moderate spinal and bilateral foraminal stenosis, left greater than right at C3-4. 5. Mild spinal and left foraminal encroachment at C5-6. 6. Interbody fusion changes at C6-7. No spinal or foraminal stenosis. 7. Advanced upper thoracic spine disc disease. Electronically Signed   By: PMarijo SanesM.D.   On: 08/16/2020 12:30   ECHOCARDIOGRAM COMPLETE  Result Date: 08/16/2020    ECHOCARDIOGRAM REPORT   Patient Name:   SKAALA REEFERDate of Exam: 08/16/2020 Medical Rec #:  [id number]       Height:       69.0 in Accession #:    [id number]      Weight:       154.3 lb Date of Birth:  1965-11-18        BSA:          1.850 m Patient Age:    47 years         BP:           120/70 mmHg Patient Gender: F                HR:           70 bpm. Exam Location:  [facility] Procedure: 2D Echo Indications:    TIA  Sonographer:    Carl Best RN Referring Phys: [id number] OLADAPO ADEFESO IMPRESSIONS  1. Left ventricular ejection fraction, by estimation, is 55 to 60%. The left ventricle has normal function. The left ventricle has no regional wall motion abnormalities. There is mild left ventricular hypertrophy. Left  ventricular diastolic parameters are indeterminate.  2. Right ventricular systolic function is normal. The right ventricular size is normal.  3. The mitral valve is normal in structure. No evidence of mitral valve regurgitation.  4. The aortic valve is normal in structure. Aortic valve regurgitation is not visualized.  5. The inferior vena cava is normal in size with greater than 50% respiratory variability, suggesting right atrial pressure of 3 mmHg. FINDINGS  Left Ventricle: Left ventricular ejection fraction, by estimation, is 55 to 60%. The left ventricle has normal function. The left ventricle has no regional wall motion abnormalities. The left ventricular internal cavity size was normal in size. There is  mild left ventricular hypertrophy. Left ventricular diastolic parameters are indeterminate. Right Ventricle: The right ventricular size is normal. Right vetricular wall thickness was not assessed. Right ventricular systolic function is normal. Left Atrium: Left atrial size was normal in size. Right Atrium: Right atrial size was normal in size. Pericardium: Trivial pericardial effusion is present. Mitral Valve: The mitral valve is normal in structure. No evidence of mitral valve regurgitation. Tricuspid Valve: The tricuspid valve is normal in structure. Tricuspid valve regurgitation is trivial. Aortic Valve: The aortic valve is normal in structure. Aortic valve regurgitation is not visualized. Pulmonic Valve: The pulmonic valve was normal in structure. Pulmonic valve regurgitation is not visualized. Aorta: The aortic root and ascending aorta are structurally normal, with no evidence of dilitation. Venous: The inferior vena cava is normal in size with greater than 50% respiratory variability, suggesting right atrial pressure of 3 mmHg. IAS/Shunts: No atrial level shunt detected by color flow Doppler.  LEFT VENTRICLE PLAX 2D LVIDd:         3.70 cm  Diastology LVIDs:         2.90 cm  LV e' medial:    6.85 cm/s LV  PW:         1.20 cm  LV E/e' medial:  15.5 LV IVS:        1.00 cm  LV e' lateral:   7.07 cm/s LVOT diam:     1.80 cm  LV E/e' lateral: 15.0 LV SV:         41 LV SV Index:   22 LVOT Area:     2.54 cm  RIGHT VENTRICLE            IVC RV Basal diam:  2.70 cm    IVC diam: 1.30 cm RV S prime:     6.85 cm/s TAPSE (M-mode): 1.7 cm LEFT ATRIUM             Index       RIGHT ATRIUM  Index LA diam:        3.30 cm 1.78 cm/m  RA Area:     9.66 cm LA Vol (A2C):   46.2 ml 24.97 ml/m RA Volume:   22.00 ml 11.89 ml/m LA Vol (A4C):   23.6 ml 12.75 ml/m LA Biplane Vol: 34.5 ml 18.64 ml/m  AORTIC VALVE LVOT Vmax:   85.40 cm/s LVOT Vmean:  55.100 cm/s LVOT VTI:    0.162 m  AORTA Ao Root diam: 2.70 cm Ao Asc diam:  2.30 cm MITRAL VALVE MV Area (PHT): 3.39 cm     SHUNTS MV Decel Time: 224 msec     Systemic VTI:  0.16 m MV E velocity: 106.00 cm/s  Systemic Diam: 1.80 cm MV A velocity: 111.00 cm/s MV E/A ratio:  0.95 Dorris Carnes MD Electronically signed by Dorris Carnes MD Signature Date/Time: 08/16/2020/2:09:03 PM    Final     Scheduled Meds:  aspirin EC  81 mg Oral Daily   atorvastatin  40 mg Oral Daily   lamoTRIgine  200 mg Oral Daily   nicotine  14 mg Transdermal Daily   [START ON 08/17/2020] predniSONE  10 mg Oral Q breakfast   traZODone  100 mg Oral QHS    Continuous Infusions:   LOS: 0 days     Annita Brod, MD [facility]   08/16/2020, 2:48 PM

## 2020-08-17 ENCOUNTER — Observation Stay (HOSPITAL_BASED_OUTPATIENT_CLINIC_OR_DEPARTMENT_OTHER): Payer: Medicare Other

## 2020-08-17 ENCOUNTER — Observation Stay (HOSPITAL_COMMUNITY): Payer: Medicare Other

## 2020-08-17 DIAGNOSIS — G8929 Other chronic pain: Secondary | ICD-10-CM | POA: Diagnosis not present

## 2020-08-17 DIAGNOSIS — Z885 Allergy status to narcotic agent status: Secondary | ICD-10-CM | POA: Diagnosis not present

## 2020-08-17 DIAGNOSIS — Z79899 Other long term (current) drug therapy: Secondary | ICD-10-CM | POA: Diagnosis not present

## 2020-08-17 DIAGNOSIS — I513 Intracardiac thrombosis, not elsewhere classified: Secondary | ICD-10-CM | POA: Diagnosis not present

## 2020-08-17 DIAGNOSIS — I129 Hypertensive chronic kidney disease with stage 1 through stage 4 chronic kidney disease, or unspecified chronic kidney disease: Secondary | ICD-10-CM | POA: Diagnosis not present

## 2020-08-17 DIAGNOSIS — F17210 Nicotine dependence, cigarettes, uncomplicated: Secondary | ICD-10-CM | POA: Diagnosis not present

## 2020-08-17 DIAGNOSIS — M545 Low back pain, unspecified: Secondary | ICD-10-CM | POA: Diagnosis not present

## 2020-08-17 DIAGNOSIS — Z882 Allergy status to sulfonamides status: Secondary | ICD-10-CM | POA: Diagnosis not present

## 2020-08-17 DIAGNOSIS — N179 Acute kidney failure, unspecified: Secondary | ICD-10-CM | POA: Diagnosis not present

## 2020-08-17 DIAGNOSIS — E785 Hyperlipidemia, unspecified: Secondary | ICD-10-CM | POA: Diagnosis not present

## 2020-08-17 DIAGNOSIS — I63232 Cerebral infarction due to unspecified occlusion or stenosis of left carotid arteries: Secondary | ICD-10-CM

## 2020-08-17 DIAGNOSIS — I639 Cerebral infarction, unspecified: Secondary | ICD-10-CM | POA: Diagnosis not present

## 2020-08-17 DIAGNOSIS — N183 Chronic kidney disease, stage 3 unspecified: Secondary | ICD-10-CM | POA: Diagnosis not present

## 2020-08-17 DIAGNOSIS — I10 Essential (primary) hypertension: Secondary | ICD-10-CM | POA: Diagnosis not present

## 2020-08-17 DIAGNOSIS — D72829 Elevated white blood cell count, unspecified: Secondary | ICD-10-CM | POA: Diagnosis not present

## 2020-08-17 DIAGNOSIS — F319 Bipolar disorder, unspecified: Secondary | ICD-10-CM | POA: Diagnosis not present

## 2020-08-17 DIAGNOSIS — I6501 Occlusion and stenosis of right vertebral artery: Secondary | ICD-10-CM | POA: Diagnosis not present

## 2020-08-17 DIAGNOSIS — Z86711 Personal history of pulmonary embolism: Secondary | ICD-10-CM | POA: Diagnosis not present

## 2020-08-17 DIAGNOSIS — M546 Pain in thoracic spine: Secondary | ICD-10-CM | POA: Diagnosis not present

## 2020-08-17 DIAGNOSIS — Z91041 Radiographic dye allergy status: Secondary | ICD-10-CM | POA: Diagnosis not present

## 2020-08-17 DIAGNOSIS — I6522 Occlusion and stenosis of left carotid artery: Secondary | ICD-10-CM | POA: Diagnosis not present

## 2020-08-17 DIAGNOSIS — Z833 Family history of diabetes mellitus: Secondary | ICD-10-CM | POA: Diagnosis not present

## 2020-08-17 DIAGNOSIS — I6622 Occlusion and stenosis of left posterior cerebral artery: Secondary | ICD-10-CM | POA: Diagnosis not present

## 2020-08-17 DIAGNOSIS — Z841 Family history of disorders of kidney and ureter: Secondary | ICD-10-CM | POA: Diagnosis not present

## 2020-08-17 DIAGNOSIS — R4789 Other speech disturbances: Secondary | ICD-10-CM

## 2020-08-17 DIAGNOSIS — G8191 Hemiplegia, unspecified affecting right dominant side: Secondary | ICD-10-CM | POA: Diagnosis not present

## 2020-08-17 DIAGNOSIS — E782 Mixed hyperlipidemia: Secondary | ICD-10-CM | POA: Diagnosis not present

## 2020-08-17 DIAGNOSIS — Z20822 Contact with and (suspected) exposure to covid-19: Secondary | ICD-10-CM | POA: Diagnosis not present

## 2020-08-17 DIAGNOSIS — G459 Transient cerebral ischemic attack, unspecified: Secondary | ICD-10-CM | POA: Diagnosis present

## 2020-08-17 DIAGNOSIS — E876 Hypokalemia: Secondary | ICD-10-CM | POA: Diagnosis not present

## 2020-08-17 DIAGNOSIS — I63412 Cerebral infarction due to embolism of left middle cerebral artery: Secondary | ICD-10-CM | POA: Diagnosis not present

## 2020-08-17 DIAGNOSIS — R4701 Aphasia: Secondary | ICD-10-CM | POA: Diagnosis not present

## 2020-08-17 LAB — BASIC METABOLIC PANEL
Anion gap: 6 (ref 5–15)
BUN: 26 mg/dL — ABNORMAL HIGH (ref 6–20)
CO2: 29 mmol/L (ref 22–32)
Calcium: 8.9 mg/dL (ref 8.9–10.3)
Chloride: 102 mmol/L (ref 98–111)
Creatinine, Ser: 0.96 mg/dL (ref 0.44–1.00)
GFR, Estimated: >60 mL/min (ref >60)
Glucose, Bld: 116 mg/dL — ABNORMAL HIGH (ref 70–99)
Potassium: 3.8 mmol/L (ref 3.5–5.1)
Sodium: 137 mmol/L (ref 135–145)

## 2020-08-17 LAB — CBC
HCT: 46.3 % — ABNORMAL HIGH (ref 36.0–46.0)
Hemoglobin: 14.8 g/dL (ref 12.0–15.0)
MCH: 27.4 pg (ref 26.0–34.0)
MCHC: 32 g/dL (ref 30.0–36.0)
MCV: 85.7 fL (ref 80.0–100.0)
Platelets: 318 10*3/uL (ref 150–400)
RBC: 5.4 MIL/uL — ABNORMAL HIGH (ref 3.87–5.11)
RDW: 14.7 % (ref 11.5–15.5)
WBC: 17.7 10*3/uL — ABNORMAL HIGH (ref 4.0–10.5)
nRBC: 0 % (ref 0.0–0.2)

## 2020-08-17 LAB — PROCALCITONIN: Procalcitonin: <0.1 ng/mL

## 2020-08-17 MED ORDER — CLOPIDOGREL BISULFATE 75 MG PO TABS
75.0000 mg | ORAL_TABLET | Freq: Every day | ORAL | 0 refills | Status: DC
Start: 2020-08-17 — End: 2020-08-21

## 2020-08-17 MED ORDER — ASPIRIN 81 MG PO TBEC: 81.0000 mg | DELAYED_RELEASE_TABLET | Freq: Every day | ORAL | 11 refills | Status: AC

## 2020-08-17 MED ORDER — NICOTINE POLACRILEX 2 MG MT GUM: 2.0000 mg | CHEWING_GUM | OROMUCOSAL | 0 refills | Status: DC | PRN

## 2020-08-17 MED ORDER — METHYLPREDNISOLONE SODIUM SUCC 125 MG IJ SOLR
125.0000 mg | Freq: Once | INTRAMUSCULAR | Status: AC
  Administered 2020-08-17: 125 mg via INTRAVENOUS
  Filled 2020-08-17: qty 2

## 2020-08-17 MED ORDER — ATORVASTATIN CALCIUM 80 MG PO TABS
80.0000 mg | ORAL_TABLET | Freq: Every day | ORAL | 1 refills | Status: DC
Start: 2020-08-17 — End: 2020-10-27

## 2020-08-17 MED ORDER — DIPHENHYDRAMINE HCL 50 MG/ML IJ SOLN
25.0000 mg | Freq: Once | INTRAMUSCULAR | Status: AC
  Administered 2020-08-18: 25 mg via INTRAVENOUS
  Filled 2020-08-17: qty 1

## 2020-08-17 NOTE — ED Notes (Signed)
Family updated as to patient's status.

## 2020-08-17 NOTE — ED Notes (Signed)
[facility] called

## 2020-08-17 NOTE — Consult Note (Signed)
[facility] [place]  ASSESSMENT / PLAN: [name] is a 55 y.o. female with symptomatic left 31 - 99 % carotid artery stenosis.   The patient's carotid artery stenosis merits consideration of revascularization to reduce the risk of future stroke because of carotid stenosis >50% with symptoms of TIA/CVA attributable to carotid lesion. I quoted the patient risk reduction from intervention of ~17% for symptomatic stenosis based on data from the NASCET trial.  The patient should continue best medical therapy for carotid artery stenosis including: Complete cessation from all tobacco products. Blood glucose control with goal A1c < 7%. Blood pressure control with goal blood pressure < 140/90 mmHg. Lipid reduction therapy with goal LDL-C <100 mg/dL (<70 if symptomatic from carotid artery stenosis).  Aspirin '81mg'$  PO QD.  Clopidogrel '75mg'$  PO QD. Atorvastatin 40-'80mg'$  PO QD (or other "high intensity" statin therapy).  Check CT angiogram of head and neck tonight. If anatomy is favorable for left carotid endarterectomy will proceed with this on Wednesday 08/20/20. Otherwise will do TCAR.   CHIEF COMPLAINT: stroke  HISTORY OF PRESENT ILLNESS: [name] is a 55 y.o. female admitted to internal medicine service after suffering a left MCA stroke.  The patient presented to the emergency room on 08/15/2020 with difficulty speaking and right hand weakness.  She was admitted for a stroke work-up.  MRI 08/16/2020 showed multiple small CVAs in the left frontal lobe.  Her speech and hand weakness have improved.  Carotid duplex demonstrated severe left ICA stenosis (80 to 99%).  Vascular surgery was consulted.  VASCULAR SURGICAL HISTORY: None  VASCULAR RISK FACTORS: Positive history of cerebrovascular disease / stroke / transient ischemic attack. Negative history of coronary artery disease.  Negative history of diabetes mellitus.  Positive history of smoking. Positive history of  hypertension.  Negative history of chronic kidney disease.  Negative history of chronic obstructive pulmonary disease.  AMBULATORY STATUS: Ambulatory within the community without limits  Past Medical History:  Diagnosis Date   Bipolar 1 disorder ([place])    Chronic kidney disease    donated left kidney   Hypertension    Pneumonia 2010   had 2 episodes/ was sent home on O2   Pulmonary embolism ([place]) 1994   Only 1 clot, not on lifelong anticoagulation    Past Surgical History:  Procedure Laterality Date   APPENDECTOMY  2001   Endometriosis s/p left fallopian tube removal.  2001   NEPHRECTOMY LIVING DONOR  1993   Donated kidney to sister   TONSILLECTOMY      Family History  Problem Relation Age of Onset   Breast cancer Mother    Breast cancer Maternal Grandmother    Diabetes Sister    Kidney disease Sister    Colon cancer Paternal Grandmother    Breast cancer Maternal Aunt     Social History   Socioeconomic History   Marital status: Divorced    Spouse name: Not on file   Number of children: Not on file   Years of education: Not on file   Highest education level: Not on file  Occupational History   Not on file  Tobacco Use   Smoking status: Every Day    Packs/day: 0.50    Years: 20.00    Pack years: 10.00    Types: Cigarettes   Smokeless tobacco: Never  Vaping Use   Vaping Use: Some days  Substance and Sexual Activity   Alcohol use: No    Alcohol/week: 0.0 standard drinks  Drug use: No   Sexual activity: Yes    Birth control/protection: Condom    Comment: Partner with Vasectomy  Other Topics Concern   Not on file  Social History Narrative   Not on file   Social Determinants of Health   Financial Resource Strain: Not on file  Food Insecurity: Not on file  Transportation Needs: Not on file  Physical Activity: Not on file  Stress: Not on file  Social Connections: Not on file  Intimate Partner Violence: Not on file    Allergies  Allergen  Reactions   Iohexol Hives    Hives 06/06/04, needs pre meds    Morphine And Related Hives and Itching    Denies Airway involvement   Sulfa Antibiotics Itching and Rash    Reported with TMP/ SMX  Denies Airway involvement    Current Facility-Administered Medications  Medication Dose Route Frequency Provider Last Rate Last Admin   albuterol (PROVENTIL) (2.5 MG/3ML) 0.083% nebulizer solution 2.5 mg  2.5 mg Nebulization Q6H PRN Adefeso, Oladapo, DO       aspirin EC tablet 81 mg  81 mg Oral Daily Adefeso, Oladapo, DO   81 mg at 08/17/20 1031   atorvastatin (LIPITOR) tablet 80 mg  80 mg Oral Daily Annita Brod, MD   80 mg at 08/17/20 1028   clopidogrel (PLAVIX) tablet 75 mg  75 mg Oral Daily Donnetta Simpers, MD   75 mg at 08/17/20 1029   lamoTRIgine (LAMICTAL) tablet 200 mg  200 mg Oral Daily Adefeso, Oladapo, DO   200 mg at 08/16/20 1103   nicotine (NICODERM CQ - dosed in mg/24 hours) patch 14 mg  14 mg Transdermal Daily Adefeso, Oladapo, DO   14 mg at 08/17/20 1040   traZODone (DESYREL) tablet 100 mg  100 mg Oral QHS Adefeso, Oladapo, DO   100 mg at 08/16/20 2141    REVIEW OF SYSTEMS:  '[X]'$  denotes positive finding, '[ ]'$  denotes negative finding Cardiac  Comments:  Chest pain or chest pressure:    Shortness of breath upon exertion:    Short of breath when lying flat:    Irregular heart rhythm:        Vascular    Pain in calf, thigh, or hip brought on by ambulation:    Pain in feet at night that wakes you up from your sleep:     Blood clot in your veins:    Leg swelling:         Pulmonary    Oxygen at home:    Productive cough:     Wheezing:         Neurologic    Sudden weakness in arms or legs:     Sudden numbness in arms or legs:     Sudden onset of difficulty speaking or slurred speech:    Temporary loss of vision in one eye:     Problems with dizziness:         Gastrointestinal    Blood in stool:     Vomited blood:         Genitourinary    Burning when  urinating:     Blood in urine:        Psychiatric    Major depression:         Hematologic    Bleeding problems:    Problems with blood clotting too easily:        Skin    Rashes or ulcers:  Constitutional    Fever or chills:      PHYSICAL EXAM Vitals:   08/17/20 0400 08/17/20 0943 08/17/20 1259 08/17/20 1632  BP: (!) 145/75 120/68 (!) 124/57 118/67  Pulse: 79 77 86 76  Resp: '18 18 18 17  '$ Temp:    98.1 F (36.7 C)  TempSrc:    Oral  SpO2: 93% 98% 99% 98%  Weight:      Height:        Constitutional: well appearing. no distress. Appears well nourished.  Neurologic: CN intact. no focal findings. no sensory loss. Psychiatric:  Mood and affect symmetric and appropriate. Eyes:  No icterus. No conjunctival pallor. Ears, nose, throat:  mucous membranes moist. Midline trachea.  Cardiac: regular rate and rhythm.  Respiratory:  unlabored. Abdominal:  soft, non-tender, non-distended.  Peripheral vascular: 2+ radial pulses Extremity: no edema. no cyanosis. no pallor.  Skin: no gangrene. no ulceration.  Lymphatic: no Stemmer's sign. no palpable lymphadenopathy.  PERTINENT LABORATORY AND RADIOLOGIC DATA  Most recent CBC CBC Latest Ref Rng & Units 08/17/2020 08/16/2020 08/15/2020  WBC 4.0 - 10.5 K/uL 17.7(H) 13.0(H) 16.4(H)  Hemoglobin 12.0 - 15.0 g/dL 14.8 14.3 14.3  Hematocrit 36.0 - 46.0 % 46.3(H) 45.1 44.1  Platelets 150 - 400 K/uL 318 308 317     Most recent CMP CMP Latest Ref Rng & Units 08/17/2020 08/16/2020 08/15/2020  Glucose 70 - 99 mg/dL 116(H) 86 106(H)  BUN 6 - 20 mg/dL 26(H) 22(H) 21(H)  Creatinine 0.44 - 1.00 mg/dL 0.96 0.99 1.16(H)  Sodium 135 - 145 mmol/L 137 137 138  Potassium 3.5 - 5.1 mmol/L 3.8 4.1 3.4(L)  Chloride 98 - 111 mmol/L 102 104 106  CO2 22 - 32 mmol/L '29 26 26  '$ Calcium 8.9 - 10.3 mg/dL 8.9 9.1 8.6(L)  Total Protein 6.5 - 8.1 g/dL - 6.8 6.6  Total Bilirubin 0.3 - 1.2 mg/dL - 0.7 0.6  Alkaline Phos 38 - 126 U/L - 62 64  AST 15 - 41 U/L -  14(L) 13(L)  ALT 0 - 44 U/L - 17 16    Renal function Estimated Creatinine Clearance: 68.4 mL/min (by C-G formula based on SCr of 0.96 mg/dL).  Hgb A1c MFr Bld (%)  Date Value  08/16/2020 6.1 (H)    LDL Chol Calc (NIH)  Date Value Ref Range Status  11/26/2019 128 (H) 0 - 99 mg/dL Final   LDL Cholesterol  Date Value Ref Range Status  08/16/2020 81 0 - 99 mg/dL Final    Comment:           Total Cholesterol/HDL:CHD Risk Coronary Heart Disease Risk Table                     Men   Women  1/2 Average Risk   3.4   3.3  Average Risk       5.0   4.4  2 X Average Risk   9.6   7.1  3 X Average Risk  23.4   11.0        Use the calculated Patient Ratio above and the CHD Risk Table to determine the patient's CHD Risk.        ATP III CLASSIFICATION (LDL):  <100     mg/dL   Optimal  100-129  mg/dL   Near or Above                    Optimal  130-159  mg/dL   Borderline  160-189  mg/dL   High  >190     mg/dL   Very High Performed at [facility] [address] [place], [place] [address]     CLINICAL DATA:  Neuro deficit, acute, stroke suspected. Speech disturbance.   EXAM: MRI HEAD WITHOUT AND WITH CONTRAST   TECHNIQUE: Multiplanar, multiecho pulse sequences of the brain and surrounding structures were obtained without and with intravenous contrast.   CONTRAST:  7.31m GADAVIST GADOBUTROL 1 MMOL/ML IV SOLN   COMPARISON:  Head CT 08/15/2020   FINDINGS: Brain: There are small clustered foci of restricted diffusion involving cortex and subcortical white matter in the posterior left frontal lobe with a small amount of associated enhancement there is also a punctate focus of diffusion abnormality and enhancement involving subcortical white matter of the anterior left frontal lobe. No intracranial hemorrhage, mass, midline shift, or extra-axial fluid collection is identified. There is mild cerebral atrophy.   There is widespread chronic cerebral white  matter disease characterized by both small discrete and patchy foci of T2 FLAIR hyperintensity in the juxtacortical and deep cerebral white matter as well as more extensive and confluent T2 hyperintensity in the periventricular white matter including involvement of the temporal lobes and with prominent T1 hypointensity. Some of these are oriented perpendicularly to the lateral ventricles. There also small T2 hyperintensities in both cerebellar hemispheres. The brainstem is normal in signal.   Vascular: Major intracranial vascular flow voids are preserved.   Skull and upper cervical spine: Unremarkable calvarial bone marrow signal. Cervical spine reported separately.   Sinuses/Orbits: Unremarkable orbits. Paranasal sinuses and mastoid air cells are clear.   Other: None.   IMPRESSION: 1. Small foci of restricted diffusion and enhancement in the left frontal lobe most compatible with acute to early subacute MCA infarcts (as opposed to acute demyelination). 2. Advanced chronic white matter disease which may reflect demyelinating disease, chronic small vessel ischemia, or vasculitis.     Electronically Signed   By: ALogan BoresM.D.   On: 08/16/2020 12:40  Carotid Arterial Duplex Study   Patient Name:  [name] Date of Exam:   08/17/2020  Medical Rec #: [id number]        Accession #:    [id number] Date of Birth: [id number]        Patient Gender: F  Patient Age:   89years  [facility] Procedure:      VAS [place]  Referring Phys: SGevena Barre   ---------------------------------------------------------------------------  -----     Indications:       CVA and Speech disturbance.  Comparison Study:  No previous exams   Performing Technologist: Jody Hill RVT, RDMS      Examination Guidelines: A complete evaluation includes B-mode imaging,  spectral  Doppler, color Doppler, and power Doppler as needed of all accessible  portions  of  each vessel. Bilateral testing is considered an integral part of a  complete  examination. Limited examinations for reoccurring indications may be  performed  as noted.      Right Carotid Findings:  +----------+--------+--------+--------+------------------+-----------------  -+            PSV cm/sEDV cm/sStenosisPlaque DescriptionComments             +----------+--------+--------+--------+------------------+-----------------  -+  CCA Prox  156     62  intimal  thickening  +----------+--------+--------+--------+------------------+-----------------  -+  CCA Distal71      22                                intimal  thickening  +----------+--------+--------+--------+------------------+-----------------  -+  ICA Prox  81      32                                                     +----------+--------+--------+--------+------------------+-----------------  -+  ICA Distal93      37                                                     +----------+--------+--------+--------+------------------+-----------------  -+  ECA       83      11                                                     +----------+--------+--------+--------+------------------+-----------------  -+   +----------+--------+-------+----------------+-------------------+            PSV cm/sEDV cmsDescribe        Arm Pressure (mmHG)  +----------+--------+-------+----------------+-------------------+  [id number]            Multiphasic, WNL                     +----------+--------+-------+----------------+-------------------+   +---------+--------+--------+--------------+  VertebralPSV cm/sEDV cm/sNot identified  +---------+--------+--------+--------------+       Left Carotid Findings:  +----------+--------+--------+--------+------------------+-----------------  -+            PSV cm/sEDV cm/sStenosisPlaque  DescriptionComments             +----------+--------+--------+--------+------------------+-----------------  -+  CCA Prox  67      15                                intimal  thickening  +----------+--------+--------+--------+------------------+-----------------  -+  CCA Distal62      14                                                     +----------+--------+--------+--------+------------------+-----------------  -+  ICA Prox  702     408     80-99%  hypoechoic                             +----------+--------+--------+--------+------------------+-----------------  -+  ICA Mid   74      23                                                     +----------+--------+--------+--------+------------------+-----------------  -+  ICA Distal27      13                                                     +----------+--------+--------+--------+------------------+-----------------  -+  ECA       96      16                                                     +----------+--------+--------+--------+------------------+-----------------  -+   +----------+--------+--------+----------------+-------------------+            PSV cm/sEDV cm/sDescribe        Arm Pressure (mmHG)  +----------+--------+--------+----------------+-------------------+  [id number]             Multiphasic, WNL                     +----------+--------+--------+----------------+-------------------+   +---------+--------+--+--------+--+---------+  VertebralPSV cm/s71EDV cm/s33Antegrade  +---------+--------+--+--------+--+---------+   Unable to obtained accurate PSV of proximal ICA due to level of stenosis.       Summary:  Right Carotid: The extracranial vessels were near-normal with only minimal  wall                 thickening or plaque.   Left Carotid: Velocities in the left ICA are consistent with a 80-99%  stenosis.   Vertebrals:  Left vertebral artery  demonstrates antegrade flow. Right  vertebral               artery was not visualized.  Subclavians: Normal flow hemodynamics were seen in bilateral subclavian               arteries.   *See table(s) above for measurements and observations.     Vascular consult recommended.   Electronically signed by Jamelle Haring on 08/17/2020 at 1:58:17 PM.       [name]. Stanford Breed, MD [facility] Phone Number: [phone] 08/17/2020 7:06 PM

## 2020-08-17 NOTE — Progress Notes (Signed)
PROGRESS NOTE  [name] [id number] DOB: [birth year] DOA: 08/15/2020 PCP: Delora Fuel, MD  HPI/Recap of past 36 hours: 55 year old female with past medical history for hyperlipidemia, hypertension, bipolar disorder and tobacco abuse presented to the emergency room on 8/5 with sudden onset of word finding difficulty.  This lasted for approximately less than 30 minutes.  Upon arrival to emergency room symptoms have resolved.  No other signs of weakness.  In the emergency room, noted to be hypertensive with a systolic of [id number], minimal acute kidney injury with a creatinine of 1.16 and white blood count of 16.4.  Initial CT unremarkable and patient brought in for stroke versus TIA work-up.  MRI done on 8/6 notes multiple small acute versus subacute CVA in left frontal lobe.  Echocardiogram unrevealing.  Dysarthric speech has resolved.  Denies any weakness.  Continues to have occasional intermittent issues of right arm numbness, tingling going to right finger.  Carotid Dopplers noted left ICA stenosis ranging 80 to 99%, also noted in MRA.  Patient waiting for bed at [facility].    Assessment/Plan: Principal Problem:   Acute embolic CVA (cerebrovascular accident) [facility]): Carotid Dopplers note severe left ICA stenosis.  Pending bed for [facility] for vascular surgery to evaluate.  MRA notes same limiting stenosis on left ICA as well as nonvisualized right A1 segment and high-grade narrowings of left P1-2 junction and right PCA.  Echo and bubble study unremarkable.  Given previous dysarthric speech, suspect she also had a TIA in the temporal lobe on the left side.  LDL at 80 which is above goal for acute CVA.  Started on aspirin and Plavix.  Blood pressure overall has improved even allowing for permissive hypertension.  Seen by PT and OT who have signed off.  Speech therapy with no deficits.   Active Problems:   Kidney donor   Bipolar 1 disorder ([place]): Stable continue Lamictal.    Tobacco  abuse: Continue nicotine patch.  Heavily counseled patient to quit.    HTN (hypertension): Blood pressure overall improved, even allowing for permissive hypertension   Thoracic back pain: Patient complains of some left hand finger numbness after steroid injection in the past.  Cervical CT notes some mild to moderate cervical stenosis but not necessarily coordinating with her symptoms.  Neuro feels symptoms may be related to previous CVAs from same carotid stenosis although no other CVA seen on MRI    Leukocytosis: No signs of infection.  White blood cell count down to 13 today.  May be stress margination due to acute CVA and also patient finishing up prednisone taper.    Hypokalemia: Replaced as needed.      Hyperlipidemia: Already on Lipitor 40.  Increased to 80.  LDL at 80 which is above goal for acute CVA   Code Status: Full code  Family Communication: Daughter at the bedside  Disposition Plan: [facility] for evaluation by vascular surgery   Consultants: Neurology  Procedures: Carotid Dopplers: Right side no stenosis.  Left side notes ICA stenosis 80-99% Echocardiogram: Preserved ejection fraction, no evidence of diastolic dysfunction Echocardiogram with bubble study: Normal  Antimicrobials: None  DVT prophylaxis: SCDs  Level of care: Telemetry Medical   Objective: Vitals:   08/17/20 0943 08/17/20 1259  BP: 120/68 (!) 124/57  Pulse: 77 86  Resp: 18 18  Temp:    SpO2: 98% 99%   No intake or output data in the 24 hours ending 08/17/20 1326 Filed Weights   08/15/20 1543  Weight: 70 kg   Body mass index is 22.79 kg/m.  Exam:  General: Alert and oriented x3, no acute distress Cardiovascular: Regular rate and rhythm, S1-S2 Respiratory: Clear to auscultation bilaterally Abdomen: Soft, nontender, nondistended, positive bowel sounds Musculoskeletal: No clubbing or cyanosis or edema, strength remains unchanged Skin: No skin breaks, tears or  lesions Psychiatry: Appropriate, no evidence of psychoses Neurology: Intact.  Currently no focal deficits   Data Reviewed: CBC: Recent Labs  Lab 08/15/20 1637 08/16/20 0754 08/17/20 0510  WBC 16.4* 13.0* 17.7*  NEUTROABS 5.6  --   --   HGB 14.3 14.3 14.8  HCT 44.1 45.1 46.3*  MCV 86.3 86.4 85.7  PLT 317 308 0000000    Basic Metabolic Panel: Recent Labs  Lab 08/15/20 1637 08/16/20 0754 08/17/20 0510  NA 138 137 137  K 3.4* 4.1 3.8  CL 106 104 102  CO2 '26 26 29  '$ GLUCOSE 106* 86 116*  BUN 21* 22* 26*  CREATININE 1.16* 0.99 0.96  CALCIUM 8.6* 9.1 8.9  MG  --  2.1  --   PHOS  --  4.5  --     GFR: Estimated Creatinine Clearance: 68.4 mL/min (by C-G formula based on SCr of 0.96 mg/dL). Liver Function Tests: Recent Labs  Lab 08/15/20 1637 08/16/20 0754  AST 13* 14*  ALT 16 17  ALKPHOS 64 62  BILITOT 0.6 0.7  PROT 6.6 6.8  ALBUMIN 3.6 3.6    No results for input(s): LIPASE, AMYLASE in the last 168 hours. No results for input(s): AMMONIA in the last 168 hours. Coagulation Profile: Recent Labs  Lab 08/16/20 0754  INR 0.9    Cardiac Enzymes: No results for input(s): CKTOTAL, CKMB, CKMBINDEX, TROPONINI in the last 168 hours. BNP (last 3 results) No results for input(s): PROBNP in the last 8760 hours. HbA1C: Recent Labs    08/16/20 0624  HGBA1C 6.1*    CBG: No results for input(s): GLUCAP in the last 168 hours. Lipid Profile: Recent Labs    08/16/20 0624  CHOL 160  HDL 55  LDLCALC 81  TRIG 119  CHOLHDL 2.9    Thyroid Function Tests: No results for input(s): TSH, T4TOTAL, FREET4, T3FREE, THYROIDAB in the last 72 hours. Anemia Panel: No results for input(s): VITAMINB12, FOLATE, FERRITIN, TIBC, IRON, RETICCTPCT in the last 72 hours. Urine analysis:    Component Value Date/Time   COLORURINE YELLOW 08/15/2020 1534   APPEARANCEUR CLEAR 08/15/2020 1534   LABSPEC 1.012 08/15/2020 1534   PHURINE 6.0 08/15/2020 1534   GLUCOSEU NEGATIVE 08/15/2020  1534   HGBUR MODERATE (A) 08/15/2020 1534   BILIRUBINUR NEGATIVE 08/15/2020 1534   BILIRUBINUR negative 11/23/2016 1020   KETONESUR NEGATIVE 08/15/2020 1534   PROTEINUR 100 (A) 08/15/2020 1534   UROBILINOGEN 0.2 11/23/2016 1020   UROBILINOGEN 0.2 11/06/2016 1220   NITRITE NEGATIVE 08/15/2020 1534   LEUKOCYTESUR MODERATE (A) 08/15/2020 1534   Sepsis Labs: '@LABRCNTIP'$ (procalcitonin:4,lacticidven:4)  ) Recent Results (from the past 240 hour(s))  Resp Panel by RT-PCR (Flu A&B, Covid) Nasopharyngeal Swab     Status: None   Collection Time: 08/15/20  8:06 PM   Specimen: Nasopharyngeal Swab; Nasopharyngeal(NP) swabs in vial transport medium  Result Value Ref Range Status   SARS Coronavirus 2 by RT PCR NEGATIVE NEGATIVE Final    Comment: (NOTE) SARS-CoV-2 target nucleic acids are NOT DETECTED.  The SARS-CoV-2 RNA is generally detectable in upper respiratory specimens during the acute phase of infection. The lowest concentration of SARS-CoV-2 viral copies this assay  can detect is 138 copies/mL. A negative result does not preclude SARS-Cov-2 infection and should not be used as the sole basis for treatment or other patient management decisions. A negative result may occur with  improper specimen collection/handling, submission of specimen other than nasopharyngeal swab, presence of viral mutation(s) within the areas targeted by this assay, and inadequate number of viral copies(<138 copies/mL). A negative result must be combined with clinical observations, patient history, and epidemiological information. The expected result is Negative.  Fact Sheet for Patients:  EntrepreneurPulse.com.au  Fact Sheet for Healthcare Providers:  IncredibleEmployment.be  This test is no t yet approved or cleared by the [place] FDA and  has been authorized for detection and/or diagnosis of SARS-CoV-2 by FDA under an Emergency Use Authorization (EUA). This EUA will  remain  in effect (meaning this test can be used) for the duration of the COVID-19 declaration under Section 564(b)(1) of the Act, 21 U.S.C.section 360bbb-3(b)(1), unless the authorization is terminated  or revoked sooner.       Influenza A by PCR NEGATIVE NEGATIVE Final   Influenza B by PCR NEGATIVE NEGATIVE Final    Comment: (NOTE) The Xpert Xpress SARS-CoV-2/FLU/RSV plus assay is intended as an aid in the diagnosis of influenza from Nasopharyngeal swab specimens and should not be used as a sole basis for treatment. Nasal washings and aspirates are unacceptable for Xpert Xpress SARS-CoV-2/FLU/RSV testing.  Fact Sheet for Patients: EntrepreneurPulse.com.au  Fact Sheet for Healthcare Providers: IncredibleEmployment.be  This test is not yet approved or cleared by the [place] FDA and has been authorized for detection and/or diagnosis of SARS-CoV-2 by FDA under an Emergency Use Authorization (EUA). This EUA will remain in effect (meaning this test can be used) for the duration of the COVID-19 declaration under Section 564(b)(1) of the Act, 21 U.S.C. section 360bbb-3(b)(1), unless the authorization is terminated or revoked.  Performed at [facility], [place] [address], [place], [place] [address]       Studies: MR ANGIO HEAD WO CONTRAST  Result Date: 08/17/2020 CLINICAL DATA:  TIA.  History of smoking. EXAM: MRA HEAD WITHOUT CONTRAST TECHNIQUE: Angiographic images of the Circle of Willis were acquired using MRA technique without intravenous contrast. COMPARISON:  Brain MRI from yesterday FINDINGS: Anterior circulation: Small left ICA in the upper neck with attenuated signal intensity throughout the cavernous sinus and left proximal MCA. No visible right ACA, which could be congenital or pathologic; no acute ACA territory infarcts on preceding brain MRI. Negative for aneurysm. Posterior circulation: Very diminutive right  vertebral artery which effectively ends in the PICA. The basilar is widely patent. Significant fetal type contribution to the bilateral PCA with evidence of high-grade atheromatous narrowing at the left P1/2 junction. Less intense flow in the right PCA without discrete underlying stenosis Anatomic variants: As above IMPRESSION: 1. Attenuated/underfilling left ICA, suspect flow limiting stenosis more proximal in the neck. 2. Nonvisualized right A1 segment which could be developmental or pathologic, favor the latter. 3. High-grade narrowings at the left P1 2 junction, although the right PCA is under filled compared to the left. Electronically Signed   By: Monte Fantasia M.D.   On: 08/17/2020 11:56   ECHOCARDIOGRAM COMPLETE  Result Date: 08/16/2020    ECHOCARDIOGRAM REPORT   Patient Name:   [name] Date of Exam: 08/16/2020 Medical Rec #:  [id number]        Height:       69.0 in Accession #:    [id number]  Weight:       154.3 lb Date of Birth:  August 16, 1965        BSA:          1.850 m Patient Age:    55 years         BP:           120/70 mmHg Patient Gender: F                HR:           70 bpm. Exam Location:  [facility] Procedure: 2D Echo Indications:    TIA  Sonographer:    Carl Best RN Referring Phys: [id number] OLADAPO ADEFESO IMPRESSIONS  1. Left ventricular ejection fraction, by estimation, is 55 to 60%. The left ventricle has normal function. The left ventricle has no regional wall motion abnormalities. There is mild left ventricular hypertrophy. Left ventricular diastolic parameters are indeterminate.  2. Right ventricular systolic function is normal. The right ventricular size is normal.  3. The mitral valve is normal in structure. No evidence of mitral valve regurgitation.  4. The aortic valve is normal in structure. Aortic valve regurgitation is not visualized.  5. The inferior vena cava is normal in size with greater than 50% respiratory variability, suggesting right atrial pressure of 3 mmHg.  FINDINGS  Left Ventricle: Left ventricular ejection fraction, by estimation, is 55 to 60%. The left ventricle has normal function. The left ventricle has no regional wall motion abnormalities. The left ventricular internal cavity size was normal in size. There is  mild left ventricular hypertrophy. Left ventricular diastolic parameters are indeterminate. Right Ventricle: The right ventricular size is normal. Right vetricular wall thickness was not assessed. Right ventricular systolic function is normal. Left Atrium: Left atrial size was normal in size. Right Atrium: Right atrial size was normal in size. Pericardium: Trivial pericardial effusion is present. Mitral Valve: The mitral valve is normal in structure. No evidence of mitral valve regurgitation. Tricuspid Valve: The tricuspid valve is normal in structure. Tricuspid valve regurgitation is trivial. Aortic Valve: The aortic valve is normal in structure. Aortic valve regurgitation is not visualized. Pulmonic Valve: The pulmonic valve was normal in structure. Pulmonic valve regurgitation is not visualized. Aorta: The aortic root and ascending aorta are structurally normal, with no evidence of dilitation. Venous: The inferior vena cava is normal in size with greater than 50% respiratory variability, suggesting right atrial pressure of 3 mmHg. IAS/Shunts: No atrial level shunt detected by color flow Doppler.  LEFT VENTRICLE PLAX 2D LVIDd:         3.70 cm  Diastology LVIDs:         2.90 cm  LV e' medial:    6.85 cm/s LV PW:         1.20 cm  LV E/e' medial:  15.5 LV IVS:        1.00 cm  LV e' lateral:   7.07 cm/s LVOT diam:     1.80 cm  LV E/e' lateral: 15.0 LV SV:         41 LV SV Index:   22 LVOT Area:     2.54 cm  RIGHT VENTRICLE            IVC RV Basal diam:  2.70 cm    IVC diam: 1.30 cm RV S prime:     6.85 cm/s TAPSE (M-mode): 1.7 cm LEFT ATRIUM             Index  RIGHT ATRIUM          Index LA diam:        3.30 cm 1.78 cm/m  RA Area:     9.66 cm LA Vol  (A2C):   46.2 ml 24.97 ml/m RA Volume:   22.00 ml 11.89 ml/m LA Vol (A4C):   23.6 ml 12.75 ml/m LA Biplane Vol: 34.5 ml 18.64 ml/m  AORTIC VALVE LVOT Vmax:   85.40 cm/s LVOT Vmean:  55.100 cm/s LVOT VTI:    0.162 m  AORTA Ao Root diam: 2.70 cm Ao Asc diam:  2.30 cm MITRAL VALVE MV Area (PHT): 3.39 cm     SHUNTS MV Decel Time: 224 msec     Systemic VTI:  0.16 m MV E velocity: 106.00 cm/s  Systemic Diam: 1.80 cm MV A velocity: 111.00 cm/s MV E/A ratio:  0.95 Dorris Carnes MD Electronically signed by Dorris Carnes MD Signature Date/Time: 08/16/2020/2:09:03 PM    Final    ECHOCARDIOGRAM LIMITED BUBBLE STUDY  Result Date: 08/17/2020    ECHOCARDIOGRAM LIMITED REPORT   Patient Name:   [name] Date of Exam: 08/17/2020 Medical Rec #:  [id number]        Height:       69.0 in Accession #:    [id number]       Weight:       154.3 lb Date of Birth:  10/12/1965        BSA:          1.850 m Patient Age:    47 years         BP:           145/75 mmHg Patient Gender: F                HR:           79 bpm. Exam Location:  Inpatient Procedure: Limited Echo and Saline Contrast Bubble Study Indications:    Bubble study only  History:        Patient has prior history of Echocardiogram examinations, most                 recent 08/16/2020. Risk Factors:Hypertension.  Sonographer:    Throop Referring Phys: [id number] [place]  1. Limited exam for Bubble Study Negative for intracardiac shunt.  2. Agitated saline contrast bubble study was negative, with no evidence of any interatrial shunt. FINDINGS  IAS/Shunts: Agitated saline contrast was given intravenously to evaluate for intracardiac shunting. Agitated saline contrast bubble study was negative, with no evidence of any interatrial shunt. Dorris Carnes MD Electronically signed by Dorris Carnes MD Signature Date/Time: 08/17/2020/9:54:04 AM    Final    VAS US CAROTID  Result Date: 08/17/2020 Carotid Arterial Duplex Study Patient Name:  [name]  Date of  Exam:   08/17/2020 Medical Rec #: [id number]         Accession #:    [id number] Date of Birth: [birth year]         Patient Gender: F Patient Age:   64 years Exam Location:  [facility] Procedure:      VAS US CAROTID Referring Phys: Gevena Barre --------------------------------------------------------------------------------  Indications:       CVA and Speech disturbance. Comparison Study:  No previous exams Performing Technologist: Jody Hill RVT, RDMS  Examination Guidelines: A complete evaluation includes B-mode imaging, spectral Doppler, color Doppler, and power Doppler as needed of all accessible portions of each vessel. Bilateral testing  is considered an integral part of a complete examination. Limited examinations for reoccurring indications may be performed as noted.  Right Carotid Findings: +----------+--------+--------+--------+------------------+------------------+           PSV cm/sEDV cm/sStenosisPlaque DescriptionComments           +----------+--------+--------+--------+------------------+------------------+ CCA Prox  156     62                                intimal thickening +----------+--------+--------+--------+------------------+------------------+ CCA Distal71      22                                intimal thickening +----------+--------+--------+--------+------------------+------------------+ ICA Prox  81      32                                                   +----------+--------+--------+--------+------------------+------------------+ ICA Distal93      37                                                   +----------+--------+--------+--------+------------------+------------------+ ECA       83      11                                                   +----------+--------+--------+--------+------------------+------------------+ +----------+--------+-------+----------------+-------------------+           PSV cm/sEDV cmsDescribe        Arm  Pressure (mmHG) +----------+--------+-------+----------------+-------------------+ [id number]            Multiphasic, WNL                    +----------+--------+-------+----------------+-------------------+ +---------+--------+--------+--------------+ VertebralPSV cm/sEDV cm/sNot identified +---------+--------+--------+--------------+  Left Carotid Findings: +----------+--------+--------+--------+------------------+------------------+           PSV cm/sEDV cm/sStenosisPlaque DescriptionComments           +----------+--------+--------+--------+------------------+------------------+ CCA Prox  67      15                                intimal thickening +----------+--------+--------+--------+------------------+------------------+ CCA Distal62      14                                                   +----------+--------+--------+--------+------------------+------------------+ ICA Prox  702     408     80-99%  hypoechoic                           +----------+--------+--------+--------+------------------+------------------+ ICA Mid   74      23                                                   +----------+--------+--------+--------+------------------+------------------+  ICA Distal27      13                                                   +----------+--------+--------+--------+------------------+------------------+ ECA       96      16                                                   +----------+--------+--------+--------+------------------+------------------+ +----------+--------+--------+----------------+-------------------+           PSV cm/sEDV cm/sDescribe        Arm Pressure (mmHG) +----------+--------+--------+----------------+-------------------+ [id number]             Multiphasic, WNL                    +----------+--------+--------+----------------+-------------------+ +---------+--------+--+--------+--+---------+ VertebralPSV  cm/s71EDV cm/s33Antegrade +---------+--------+--+--------+--+---------+ Unable to obtained accurate PSV of proximal ICA due to level of stenosis.  Summary: Right Carotid: The extracranial vessels were near-normal with only minimal wall                thickening or plaque. Left Carotid: Velocities in the left ICA are consistent with a 80-99% stenosis. Vertebrals:  Left vertebral artery demonstrates antegrade flow. Right vertebral              artery was not visualized. Subclavians: Normal flow hemodynamics were seen in bilateral subclavian              arteries. *See table(s) above for measurements and observations.  Vascular consult recommended.    Preliminary     Scheduled Meds:  aspirin EC  81 mg Oral Daily   atorvastatin  80 mg Oral Daily   clopidogrel  75 mg Oral Daily   lamoTRIgine  200 mg Oral Daily   nicotine  14 mg Transdermal Daily   traZODone  100 mg Oral QHS    Continuous Infusions:   LOS: 0 days     Annita Brod, MD [facility]   08/17/2020, 1:26 PM

## 2020-08-17 NOTE — Progress Notes (Signed)
*  PRELIMINARY RESULTS* Echocardiogram 2D Echocardiogram limited has been performed for bubble study only .  Luisa Hart RDCS 08/17/2020, 9:25 AM

## 2020-08-17 NOTE — Progress Notes (Signed)
Carotid duplex ultrasound has been completed.  Dr. [place] Pink messaged with critical findings.  Results can be found under chart review under CV PROC. 08/17/2020 9:32 AM Jettie Mannor RVT, RDMS

## 2020-08-17 NOTE — Plan of Care (Signed)
  Problem: Education: Goal: Knowledge of disease or condition will improve Outcome: Progressing Goal: Knowledge of secondary prevention will improve Outcome: Progressing Goal: Knowledge of patient specific risk factors addressed and post discharge goals established will improve Outcome: Progressing Goal: Individualized Educational Video(s) Outcome: Progressing   Problem: Coping: Goal: Will verbalize positive feelings about self Outcome: Progressing Goal: Will identify appropriate support needs Outcome: Progressing   Problem: Health Behavior/Discharge Planning: Goal: Ability to manage health-related needs will improve Outcome: Progressing   Problem: Self-Care: Goal: Ability to participate in self-care as condition permits will improve Outcome: Progressing Goal: Verbalization of feelings and concerns over difficulty with self-care will improve Outcome: Progressing Goal: Ability to communicate needs accurately will improve Outcome: Progressing   Problem: Nutrition: Goal: Risk of aspiration will decrease Outcome: Progressing Goal: Dietary intake will improve Outcome: Progressing   Problem: Intracerebral Hemorrhage Tissue Perfusion: Goal: Complications of Intracerebral Hemorrhage will be minimized Outcome: Progressing   Problem: Ischemic Stroke/TIA Tissue Perfusion: Goal: Complications of ischemic stroke/TIA will be minimized Outcome: Progressing   Problem: Spontaneous Subarachnoid Hemorrhage Tissue Perfusion: Goal: Complications of Spontaneous Subarachnoid Hemorrhage will be minimized Outcome: Progressing   Problem: Education: Goal: Knowledge of General Education information will improve Description: Including pain rating scale, medication(s)/side effects and non-pharmacologic comfort measures Outcome: Progressing   Problem: Health Behavior/Discharge Planning: Goal: Ability to manage health-related needs will improve Outcome: Progressing   Problem: Clinical  Measurements: Goal: Ability to maintain clinical measurements within normal limits will improve Outcome: Progressing Goal: Will remain free from infection Outcome: Progressing Goal: Diagnostic test results will improve Outcome: Progressing Goal: Respiratory complications will improve Outcome: Progressing Goal: Cardiovascular complication will be avoided Outcome: Progressing   Problem: Activity: Goal: Risk for activity intolerance will decrease Outcome: Progressing   Problem: Nutrition: Goal: Adequate nutrition will be maintained Outcome: Progressing   Problem: Coping: Goal: Level of anxiety will decrease Outcome: Progressing   Problem: Elimination: Goal: Will not experience complications related to bowel motility Outcome: Progressing Goal: Will not experience complications related to urinary retention Outcome: Progressing   Problem: Pain Managment: Goal: General experience of comfort will improve Outcome: Progressing   Problem: Safety: Goal: Ability to remain free from injury will improve Outcome: Progressing   Problem: Skin Integrity: Goal: Risk for impaired skin integrity will decrease Outcome: Progressing

## 2020-08-17 NOTE — ED Notes (Signed)
Pt given breakfast tray

## 2020-08-17 NOTE — ED Notes (Signed)
Pt to MRI

## 2020-08-18 ENCOUNTER — Observation Stay (HOSPITAL_COMMUNITY): Payer: Medicare Other

## 2020-08-18 ENCOUNTER — Encounter (HOSPITAL_COMMUNITY): Payer: Self-pay | Admitting: Internal Medicine

## 2020-08-18 DIAGNOSIS — Z720 Tobacco use: Secondary | ICD-10-CM | POA: Diagnosis not present

## 2020-08-18 DIAGNOSIS — I6501 Occlusion and stenosis of right vertebral artery: Secondary | ICD-10-CM | POA: Diagnosis present

## 2020-08-18 DIAGNOSIS — M545 Low back pain, unspecified: Secondary | ICD-10-CM | POA: Diagnosis present

## 2020-08-18 DIAGNOSIS — D72829 Elevated white blood cell count, unspecified: Secondary | ICD-10-CM | POA: Diagnosis present

## 2020-08-18 DIAGNOSIS — I6522 Occlusion and stenosis of left carotid artery: Secondary | ICD-10-CM | POA: Diagnosis present

## 2020-08-18 DIAGNOSIS — N183 Chronic kidney disease, stage 3 unspecified: Secondary | ICD-10-CM | POA: Diagnosis present

## 2020-08-18 DIAGNOSIS — I639 Cerebral infarction, unspecified: Secondary | ICD-10-CM | POA: Diagnosis not present

## 2020-08-18 DIAGNOSIS — I10 Essential (primary) hypertension: Secondary | ICD-10-CM | POA: Diagnosis not present

## 2020-08-18 DIAGNOSIS — Z841 Family history of disorders of kidney and ureter: Secondary | ICD-10-CM | POA: Diagnosis not present

## 2020-08-18 DIAGNOSIS — J449 Chronic obstructive pulmonary disease, unspecified: Secondary | ICD-10-CM | POA: Diagnosis not present

## 2020-08-18 DIAGNOSIS — I63239 Cerebral infarction due to unspecified occlusion or stenosis of unspecified carotid arteries: Secondary | ICD-10-CM | POA: Diagnosis not present

## 2020-08-18 DIAGNOSIS — Z882 Allergy status to sulfonamides status: Secondary | ICD-10-CM | POA: Diagnosis not present

## 2020-08-18 DIAGNOSIS — G8929 Other chronic pain: Secondary | ICD-10-CM | POA: Diagnosis present

## 2020-08-18 DIAGNOSIS — F17210 Nicotine dependence, cigarettes, uncomplicated: Secondary | ICD-10-CM | POA: Diagnosis present

## 2020-08-18 DIAGNOSIS — Z86711 Personal history of pulmonary embolism: Secondary | ICD-10-CM | POA: Diagnosis not present

## 2020-08-18 DIAGNOSIS — I63412 Cerebral infarction due to embolism of left middle cerebral artery: Secondary | ICD-10-CM | POA: Diagnosis present

## 2020-08-18 DIAGNOSIS — E785 Hyperlipidemia, unspecified: Secondary | ICD-10-CM | POA: Diagnosis present

## 2020-08-18 DIAGNOSIS — N179 Acute kidney failure, unspecified: Secondary | ICD-10-CM | POA: Diagnosis present

## 2020-08-18 DIAGNOSIS — Z20822 Contact with and (suspected) exposure to covid-19: Secondary | ICD-10-CM | POA: Diagnosis present

## 2020-08-18 DIAGNOSIS — M546 Pain in thoracic spine: Secondary | ICD-10-CM | POA: Diagnosis present

## 2020-08-18 DIAGNOSIS — R4701 Aphasia: Secondary | ICD-10-CM | POA: Diagnosis present

## 2020-08-18 DIAGNOSIS — G459 Transient cerebral ischemic attack, unspecified: Secondary | ICD-10-CM | POA: Diagnosis present

## 2020-08-18 DIAGNOSIS — Z885 Allergy status to narcotic agent status: Secondary | ICD-10-CM | POA: Diagnosis not present

## 2020-08-18 DIAGNOSIS — F319 Bipolar disorder, unspecified: Secondary | ICD-10-CM | POA: Diagnosis present

## 2020-08-18 DIAGNOSIS — Z79899 Other long term (current) drug therapy: Secondary | ICD-10-CM | POA: Diagnosis not present

## 2020-08-18 DIAGNOSIS — Z91041 Radiographic dye allergy status: Secondary | ICD-10-CM | POA: Diagnosis not present

## 2020-08-18 DIAGNOSIS — E782 Mixed hyperlipidemia: Secondary | ICD-10-CM | POA: Diagnosis not present

## 2020-08-18 DIAGNOSIS — G8191 Hemiplegia, unspecified affecting right dominant side: Secondary | ICD-10-CM | POA: Diagnosis present

## 2020-08-18 DIAGNOSIS — E876 Hypokalemia: Secondary | ICD-10-CM | POA: Diagnosis present

## 2020-08-18 DIAGNOSIS — Z8673 Personal history of transient ischemic attack (TIA), and cerebral infarction without residual deficits: Secondary | ICD-10-CM | POA: Diagnosis not present

## 2020-08-18 DIAGNOSIS — I6503 Occlusion and stenosis of bilateral vertebral arteries: Secondary | ICD-10-CM | POA: Diagnosis not present

## 2020-08-18 DIAGNOSIS — Z833 Family history of diabetes mellitus: Secondary | ICD-10-CM | POA: Diagnosis not present

## 2020-08-18 DIAGNOSIS — I129 Hypertensive chronic kidney disease with stage 1 through stage 4 chronic kidney disease, or unspecified chronic kidney disease: Secondary | ICD-10-CM | POA: Diagnosis present

## 2020-08-18 LAB — CBC
HCT: 43.9 % (ref 36.0–46.0)
Hemoglobin: 14.1 g/dL (ref 12.0–15.0)
MCH: 27.3 pg (ref 26.0–34.0)
MCHC: 32.1 g/dL (ref 30.0–36.0)
MCV: 85.1 fL (ref 80.0–100.0)
Platelets: 324 10*3/uL (ref 150–400)
RBC: 5.16 MIL/uL — ABNORMAL HIGH (ref 3.87–5.11)
RDW: 14.5 % (ref 11.5–15.5)
WBC: 15 10*3/uL — ABNORMAL HIGH (ref 4.0–10.5)
nRBC: 0 % (ref 0.0–0.2)

## 2020-08-18 MED ORDER — HEPARIN SODIUM (PORCINE) 5000 UNIT/ML IJ SOLN
5000.0000 [IU] | Freq: Three times a day (TID) | INTRAMUSCULAR | Status: DC
  Administered 2020-08-18 – 2020-08-20 (×6): 5000 [IU] via SUBCUTANEOUS
  Filled 2020-08-18 (×6): qty 1

## 2020-08-18 MED ORDER — ACETAMINOPHEN 500 MG PO TABS
500.0000 mg | ORAL_TABLET | Freq: Four times a day (QID) | ORAL | Status: DC | PRN
  Administered 2020-08-18: 500 mg via ORAL
  Filled 2020-08-18: qty 1

## 2020-08-18 MED ORDER — IOHEXOL 350 MG/ML SOLN
100.0000 mL | Freq: Once | INTRAVENOUS | Status: AC | PRN
  Administered 2020-08-18: 100 mL via INTRAVENOUS

## 2020-08-18 NOTE — Progress Notes (Addendum)
STROKE TEAM PROGRESS NOTE   ATTENDING NOTE: I reviewed above note and agree with the assessment and plan. Pt was seen and examined.   55 year old female with history of bipolar, CKD, hypertension, PE, smoker admitted for episode of aphasia and speech difficulty lasting 30 minutes.  CT negative.  MRI showed left frontal MCA small infarcts.  CTA head and neck showed left ICA string sign, right VA occlusion. Carotid Doppler left ICA 80 to 99% stenosis.  EF 55 to 60%.  LDL 81, A1c 6.1. UDS pending.  Creatinine 0.96.  On neuro exam, patient neuro intact, no focal deficits.  Etiology for patient stroke likely due to large vessel disease with left ICA high-grade stenosis.  Vascular surgery consulted, plan for left CEA Wednesday.  Recommend to continue aspirin 81 and Plavix 75 DAPT, and Lipitor 80.  Further antiplatelet regimen per vascular surgery.  Patient educated on smoking cessation.  For detailed assessment and plan, please refer to above as I have made changes wherever appropriate.   Neurology will sign off. Please call with questions. Pt will follow up with Dr. Krista Blue (Dr. Krista Blue is pt mom's neurologist and pt would like to see Dr. Krista Blue also) at [facility] in about 4 weeks. Thanks for the consult.   Rosalin Hawking, MD PhD Stroke Neurology 08/18/2020 6:24 PM    INTERVAL HISTORY No family at bedside. Walking around room NAD.   Vitals:   08/17/20 1259 08/17/20 1632 08/17/20 1947 08/18/20 0502  BP: (!) 124/57 118/67 (!) 144/77 (!) 143/74  Pulse: 86 76 81 93  Resp: '18 17 18 '$ (!) 23  Temp:  98.1 F (36.7 C) 98.2 F (36.8 C) 98 F (36.7 C)  TempSrc:  Oral Oral Oral  SpO2: 99% 98%  98%  Weight:      Height:       CBC:  Recent Labs  Lab 08/15/20 1637 08/16/20 0754 08/17/20 0510 08/18/20 0428  WBC 16.4*   < > 17.7* 15.0*  NEUTROABS 5.6  --   --   --   HGB 14.3   < > 14.8 14.1  HCT 44.1   < > 46.3* 43.9  MCV 86.3   < > 85.7 85.1  PLT 317   < > 318 324   < > = values in this interval not displayed.    Basic Metabolic Panel:  Recent Labs  Lab 08/16/20 0754 08/17/20 0510  NA 137 137  K 4.1 3.8  CL 104 102  CO2 26 29  GLUCOSE 86 116*  BUN 22* 26*  CREATININE 0.99 0.96  CALCIUM 9.1 8.9  MG 2.1  --   PHOS 4.5  --    Lipid Panel:  Recent Labs  Lab 08/16/20 0624  CHOL 160  TRIG 119  HDL 55  CHOLHDL 2.9  VLDL 24  LDLCALC 81   HgbA1c:  Recent Labs  Lab 08/16/20 0624  HGBA1C 6.1*    IMAGING past 24 hours MR ANGIO HEAD WO CONTRAST  Result Date: 08/17/2020 CLINICAL DATA:  TIA.  History of smoking. EXAM: MRA HEAD WITHOUT CONTRAST TECHNIQUE: Angiographic images of the Circle of Willis were acquired using MRA technique without intravenous contrast. COMPARISON:  Brain MRI from yesterday FINDINGS: Anterior circulation: Small left ICA in the upper neck with attenuated signal intensity throughout the cavernous sinus and left proximal MCA. No visible right ACA, which could be congenital or pathologic; no acute ACA territory infarcts on preceding brain MRI. Negative for aneurysm. Posterior circulation: Very diminutive right vertebral artery which  effectively ends in the PICA. The basilar is widely patent. Significant fetal type contribution to the bilateral PCA with evidence of high-grade atheromatous narrowing at the left P1/2 junction. Less intense flow in the right PCA without discrete underlying stenosis Anatomic variants: As above IMPRESSION: 1. Attenuated/underfilling left ICA, suspect flow limiting stenosis more proximal in the neck. 2. Nonvisualized right A1 segment which could be developmental or pathologic, favor the latter. 3. High-grade narrowings at the left P1 2 junction, although the right PCA is under filled compared to the left. Electronically Signed   By: Monte Fantasia M.D.   On: 08/17/2020 11:56   ECHOCARDIOGRAM LIMITED BUBBLE STUDY  Result Date: 08/17/2020    ECHOCARDIOGRAM LIMITED REPORT   Patient Name:   [name] Date of Exam: 08/17/2020 Medical Rec #:   [id number]        Height:       69.0 in Accession #:    [id number]       Weight:       154.3 lb Date of Birth:  February 20, 1965        BSA:          1.850 m Patient Age:    39 years         BP:           145/75 mmHg Patient Gender: F                HR:           79 bpm. Exam Location:  Inpatient Procedure: Limited Echo and Saline Contrast Bubble Study Indications:    Bubble study only  History:        Patient has prior history of Echocardiogram examinations, most                 recent 08/16/2020. Risk Factors:Hypertension.  Sonographer:    Delleker Referring Phys: [id number] [place]  1. Limited exam for Bubble Study Negative for intracardiac shunt.  2. Agitated saline contrast bubble study was negative, with no evidence of any interatrial shunt. FINDINGS  IAS/Shunts: Agitated saline contrast was given intravenously to evaluate for intracardiac shunting. Agitated saline contrast bubble study was negative, with no evidence of any interatrial shunt. Dorris Carnes MD Electronically signed by Dorris Carnes MD Signature Date/Time: 08/17/2020/9:54:04 AM    Final    VAS US CAROTID  Result Date: 08/17/2020 Carotid Arterial Duplex Study Patient Name:  [name]  Date of Exam:   08/17/2020 Medical Rec #: [id number]         Accession #:    [id number] Date of Birth: March 03, 1965         Patient Gender: F Patient Age:   46 years Exam Location:  [facility] Procedure:      VAS US CAROTID Referring Phys: Gevena Barre --------------------------------------------------------------------------------  Indications:       CVA and Speech disturbance. Comparison Study:  No previous exams Performing Technologist: Jody Hill RVT, RDMS  Examination Guidelines: A complete evaluation includes B-mode imaging, spectral Doppler, color Doppler, and power Doppler as needed of all accessible portions of each vessel. Bilateral testing is considered an integral part of a complete examination. Limited examinations for  reoccurring indications may be performed as noted.  Right Carotid Findings: +----------+--------+--------+--------+------------------+------------------+           PSV cm/sEDV cm/sStenosisPlaque DescriptionComments           +----------+--------+--------+--------+------------------+------------------+ CCA Prox  156     62  intimal thickening +----------+--------+--------+--------+------------------+------------------+ CCA Distal71      22                                intimal thickening +----------+--------+--------+--------+------------------+------------------+ ICA Prox  81      32                                                   +----------+--------+--------+--------+------------------+------------------+ ICA Distal93      37                                                   +----------+--------+--------+--------+------------------+------------------+ ECA       83      11                                                   +----------+--------+--------+--------+------------------+------------------+ +----------+--------+-------+----------------+-------------------+           PSV cm/sEDV cmsDescribe        Arm Pressure (mmHG) +----------+--------+-------+----------------+-------------------+ [id number]            Multiphasic, WNL                    +----------+--------+-------+----------------+-------------------+ +---------+--------+--------+--------------+ VertebralPSV cm/sEDV cm/sNot identified +---------+--------+--------+--------------+  Left Carotid Findings: +----------+--------+--------+--------+------------------+------------------+           PSV cm/sEDV cm/sStenosisPlaque DescriptionComments           +----------+--------+--------+--------+------------------+------------------+ CCA Prox  67      15                                intimal thickening  +----------+--------+--------+--------+------------------+------------------+ CCA Distal62      14                                                   +----------+--------+--------+--------+------------------+------------------+ ICA Prox  702     408     80-99%  hypoechoic                           +----------+--------+--------+--------+------------------+------------------+ ICA Mid   74      23                                                   +----------+--------+--------+--------+------------------+------------------+ ICA Distal27      13                                                   +----------+--------+--------+--------+------------------+------------------+ ECA  96      16                                                   +----------+--------+--------+--------+------------------+------------------+ +----------+--------+--------+----------------+-------------------+           PSV cm/sEDV cm/sDescribe        Arm Pressure (mmHG) +----------+--------+--------+----------------+-------------------+ [id number]             Multiphasic, WNL                    +----------+--------+--------+----------------+-------------------+ +---------+--------+--+--------+--+---------+ VertebralPSV cm/s71EDV cm/s33Antegrade +---------+--------+--+--------+--+---------+ Unable to obtained accurate PSV of proximal ICA due to level of stenosis.  Summary: Right Carotid: The extracranial vessels were near-normal with only minimal wall                thickening or plaque. Left Carotid: Velocities in the left ICA are consistent with a 80-99% stenosis. Vertebrals:  Left vertebral artery demonstrates antegrade flow. Right vertebral              artery was not visualized. Subclavians: Normal flow hemodynamics were seen in bilateral subclavian              arteries. *See table(s) above for measurements and observations.  Vascular consult recommended. Electronically signed by Jamelle Haring on 08/17/2020 at 1:58:17 PM.    Final    CT ANGIO HEAD NECK W WO CM (CODE STROKE)  Result Date: 08/18/2020 CLINICAL DATA:  Stroke/TIA, assess extracranial arteries EXAM: CT ANGIOGRAPHY HEAD AND NECK TECHNIQUE: Multidetector CT imaging of the head and neck was performed using the standard protocol during bolus administration of intravenous contrast. Multiplanar CT image reconstructions and MIPs were obtained to evaluate the vascular anatomy. Carotid stenosis measurements (when applicable) are obtained utilizing NASCET criteria, using the distal internal carotid diameter as the denominator. CONTRAST:  187m OMNIPAQUE IOHEXOL 350 MG/ML SOLN COMPARISON:  08/15/2020 FINDINGS: CT HEAD FINDINGS Brain: There is no mass, hemorrhage or extra-axial collection. The size and configuration of the ventricles and extra-axial CSF spaces are normal. There is hypoattenuation of the periventricular white matter, most commonly indicating chronic ischemic microangiopathy. Skull: The visualized skull base, calvarium and extracranial soft tissues are normal. Sinuses/Orbits: No fluid levels or advanced mucosal thickening of the visualized paranasal sinuses. No mastoid or middle ear effusion. The orbits are normal. CTA NECK FINDINGS SKELETON: There is no bony spinal canal stenosis. No lytic or blastic lesion. OTHER NECK: Normal pharynx, larynx and major salivary glands. No cervical lymphadenopathy. Unremarkable thyroid gland. UPPER CHEST: No pneumothorax or pleural effusion. No nodules or masses. AORTIC ARCH: There is calcific atherosclerosis of the aortic arch. There is no aneurysm, dissection or hemodynamically significant stenosis of the visualized portion of the aorta. Conventional 3 vessel aortic branching pattern. The visualized proximal subclavian arteries are widely patent. RIGHT CAROTID SYSTEM: Normal without aneurysm, dissection or stenosis. LEFT CAROTID SYSTEM: Atheromatous plaque at the left carotid bifurcation causing  severe stenosis with angiographic string sign. The distal ICA is patent. VERTEBRAL ARTERIES: Left dominant configuration. The right vertebral artery is occluded proximally with reconstitution at the C4 level. There is no dissection, occlusion or flow-limiting stenosis to the skull base (V1-V3 segments). CTA HEAD FINDINGS POSTERIOR CIRCULATION: --Vertebral arteries: No enhancement of the diminutive right V4 segment normal left. --Inferior cerebellar arteries: Normal. --Basilar artery: Normal. --Superior cerebellar arteries: Normal. --Posterior cerebral  arteries (PCA): Normal. ANTERIOR CIRCULATION: --Intracranial internal carotid arteries: Normal. --Anterior cerebral arteries (ACA): Normal. Both A1 segments are present. Patent anterior communicating artery (a-comm). --Middle cerebral arteries (MCA): Normal. VENOUS SINUSES: As permitted by contrast timing, patent. ANATOMIC VARIANTS: Fetal origins of both posterior cerebral arteries. Review of the MIP images confirms the above findings. IMPRESSION: 1. No intracranial arterial occlusion or high-grade stenosis. 2. Severe stenosis of the proximal left internal carotid artery with angiographic string sign. 3. 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 Aortic Atherosclerosis (ICD10-I70.0). Electronically Signed   By: Ulyses Jarred M.D.   On: 08/18/2020 01:50    PHYSICAL EXAM Physical Exam  Constitutional: Appears well-developed and well-nourished.  Psych: Affect appropriate to situation Eyes: Normal external eye and conjunctiva. HENT: Normocephalic, no lesions, without obvious abnormality.   Musculoskeletal-no joint tenderness, deformity or swelling Cardiovascular: Normal rate and regular rhythm.  Respiratory: Effort normal, non-labored breathing saturations WNL GI: Soft.  No distension. There is no tenderness.  Skin: WDI   Neuro:  Mental Status: Alert, oriented, name/age/month/year/place. thought content  appropriate.  Speech fluent without evidence of aphasia.  Able to follow commands. Cranial Nerves: II: Visual fields grossly normal,  III,IV, VI: ptosis not present, extra-ocular motions intact bilaterally pupils equal, round, reactive to light and accommodation V,VII: smile symmetric, facial light touch sensation normal bilaterally VIII: hearing normal bilaterally IX,X: uvula rises symmetrically XI: bilateral shoulder shrug XII: midline tongue extension Motor: Right : Upper extremity   4+/5 Left:     Upper extremity   5/5  Lower extremity   5/5   Lower extremity   5/5 Tone and bulk:normal tone throughout; no atrophy noted Sensory:  light touch intact throughout, bilaterally Cerebellar: normal finger-to-nose,  Gait:  normal gait and station     ASSESSMENT/PLAN ROKHAYA PATCHELL is a 55 y.o. female with PMH significant for Bipolar 1, CKD, HTN, prior hx of PE who presents with an episode of acute onset aphasia and garbled speech. The episode lasted approximately 30 mins with resolution.   Initial CTH was negative. MRI Brain was obtained and was notable for an acute small left frontal lobe infarct.   She endorses that over the last week, she has had R thumb and index finger weakness wiuth weakness with R hand grip. She attributes this to having a nerve block on July 28th. She woke up the next day with these symptoms.   She smokes 20 cigarettes a day and her mom had strokes.  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 Given the patient's neurologic symptoms, recommend MRI with contrast to further evaluate CTA head & neck 1. No intracranial arterial occlusion or high-grade stenosis. 2. Severe stenosis of the proximal left internal carotid artery with angiographic string sign. 3.  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 MRI  1. Small foci of restricted diffusion and enhancement in the left  frontal lobe most compatible with acute to early subacute MCA infarcts (as opposed to acute demyelination). 2. Advanced chronic white matter disease which may reflect demyelinating disease, chronic small vessel ischemia, or vasculitis. MRA  1. Attenuated/underfilling left ICA, suspect flow limiting stenosis more proximal in the neck. 2. Nonvisualized right A1 segment which could be developmental or pathologic, favor the latter.3. High-grade narrowings at the left P1 2  junction, although the right PCA is under filled compared to the left Carotid Doppler  Right Carotid: The extracranial vessels were near-normal with only minimal  wall thickening or plaque.   Left Carotid: Velocities in the left ICA are consistent with a 80-99%  stenosis.   Vertebrals:  Left vertebral artery demonstrates antegrade flow. Right vertebral   artery was not visualized.  Subclavians: Normal flow hemodynamics were seen in bilateral subclavian  arteries.  2D Echo with bubble study: IAS/Shunts: Agitated saline contrast was given intravenously to evaluate  for intracardiac shunting. Agitated saline contrast bubble study was  negative, with no evidence of any interatrial shunt. LDL 81 HgbA1c 6.1 VTE prophylaxis - heparin    Diet   Diet Heart Room service appropriate? Yes with Assist; Fluid consistency: Thin   No antithrombotic prior to admission, now on aspirin 81 mg daily and clopidogrel 75 mg daily.  Therapy recommendations:  none Disposition:  home  Hypertension Home meds:  Cozaar '25mg'$  Stable Permissive hypertension (OK if < 220/120) but gradually normalize in 5-7 days Long-term BP goal normotensive  Hyperlipidemia Home meds:  atorvastatin 40 mg, resumed in hospital LDL 81, goal < 70 Increase atorvastatin to 80 mg daily  Continue statin at  discharge  Diabetes type II (no diagnosis Home meds:  none HgbA1c 6.1, goal < 7.0  Other Stroke Risk Factors  Cigarette smoker advised to stop smoking Family hx stroke (Mother)   Hospital day # 0  Laurey Morale, MSN, NP-C [facility] [phone]  To contact Stroke Continuity provider, please refer to [email or web address] After hours, contact General Neurology

## 2020-08-18 NOTE — Progress Notes (Signed)
PROGRESS NOTE                                                                                                                                                                                                             Patient Demographics:    [name], is a 55 y.o. female, DOB - 05-07-1965, [id number]  Outpatient Primary MD for the patient is Delora Fuel, MD    LOS - 0  Admit date - 08/15/2020    Chief Complaint  Patient presents with   Transient Ischemic Attack       Brief Narrative (HPI from H&P) - 55 year old female with past medical history for hyperlipidemia, hypertension, bipolar disorder and tobacco abuse presented to the emergency room on 8/5 with sudden onset of word finding difficulty, her work-up was suggestive of left MCA infarct along with left carotid artery stenosis and she was admitted to the hospital for stroke work-up and care   Subjective:    Tosheba Keylon today has, No headache, No chest pain, No abdominal pain - No Nausea, No new weakness tingling or numbness, no SOB.   Assessment  & Plan :     Acute left MCA ischemic infarct along with critical left ICA stenosis - her 3 seems to have resolved and currently has no deficits, on dual antiplatelet therapy and statin, neuro team and vascular surgery following.  Will require left ICA surgical intervention.  Defer further management to neurology and vascular surgery.  Stroke work-up in pathway per neurology.  A1c was 6.1, LDL was 81 above goal hence placed on statin, echo stable.  2.  Left ICA critical stenosis.  Currently on dual antiplatelet therapy and high intensity statin for secondary prevention, vascular surgery contemplating surgical intervention this admission.  3.  Hypertension.  Allow for permissive hypertension in the light of acute stroke for the next 2 to 3 days  4.  Dyslipidemia.  On Lipitor 80 mg increased from 40 for better  control   5.  Chronic low back pain.  Supportive care.  6.  Ongoing smoking.  Counseled to quit.  7.  History of bipolar disorder.  Stable no acute issues continue Lamictal.   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       Condition - Fair  Family Communication  :  None Present  Code Status :  Full  Consults  :  Neuro, VVS  PUD Prophylaxis :     Procedures  :     MRI- A - Brain  1. Small foci of restricted diffusion and enhancement in the left frontal lobe most compatible with acute to early subacute MCA infarcts (as opposed to acute demyelination). 2. Advanced chronic white matter disease which may reflect demyelinating disease, chronic small vessel ischemia, or vasculitis.  1. Attenuated/underfilling left ICA, suspect flow limiting stenosis more proximal in the neck. 2. Nonvisualized right A1 segment which could be developmental or pathologic, favor the latter. 3. High-grade narrowings at the left P1 2 junction, although the right PCA is under filled compared to the left.  MRI C Spine -  1. Age advanced degenerative cervical spondylosis with multilevel disc disease and facet disease as detailed above. 2. No cord lesions, syrinx or abnormal cord enhancement. 3. No findings suspicious for discitis, osteomyelitis or epidural abscess. 4. Mild to moderate spinal and bilateral foraminal stenosis, left greater than right at C3-4. 5. Mild spinal and left foraminal encroachment at C5-6. 6. Interbody fusion changes at C6-7. No spinal or foraminal stenosis. 7. Advanced upper thoracic spine disc disease.  Carotid US - Right Carotid: The extracranial vessels were near-normal with only minimal wall thickening or plaque. Left Carotid: Velocities in the left ICA are consistent with a 80-99% stenosis. Vertebrals:  Left vertebral artery demonstrates antegrade flow. Right vertebral artery was not visualized. Subclavians: Normal flow hemodynamics were seen  in bilateral subclavian arteries.  CTA  - 1. No intracranial arterial occlusion or high-grade stenosis. 2. Severe stenosis of the proximal left internal carotid artery with angiographic string sign. 3. 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 Aortic Atherosclerosis (ICD10-I70.0).  TTE - 1. Left ventricular ejection fraction, by estimation, is 55 to 60%. The left ventricle has normal function. The left ventricle has no regional wall motion abnormalities. There is mild left ventricular hypertrophy. Left ventricular diastolic parameters are indeterminate.  2. Right ventricular systolic function is normal. The right ventricular size is normal.  3. The mitral valve is normal in structure. No evidence of mitral valve regurgitation.  4. The aortic valve is normal in structure. Aortic valve regurgitation is not visualized.  5. The inferior vena cava is normal in size with greater than 50% respiratory variability, suggesting right atrial pressure of 3 mmHg.      Disposition Plan  :    Status is: Observation  Dispo: The patient is from: Home              Anticipated d/c is to: Home              Patient currently is not medically stable to d/c.   Difficult to place patient No  DVT Prophylaxis  :  Heparin added  SCDs Start: 08/16/20 0754  Lab Results  Component Value Date   PLT 324 08/18/2020    Diet :  Diet Order             Diet Heart Room service appropriate? Yes with Assist; Fluid consistency: Thin  Diet effective now                    Inpatient Medications  Scheduled Meds:  aspirin EC  81 mg Oral Daily   atorvastatin  80 mg Oral Daily   clopidogrel  75 mg Oral Daily   lamoTRIgine  200 mg Oral  Daily   nicotine  14 mg Transdermal Daily   traZODone  100 mg Oral QHS   Continuous Infusions: PRN Meds:.albuterol  Antibiotics  :    Anti-infectives (From admission, onward)    None        Time Spent in minutes  30   Lala Lund M.D on 08/18/2020 at 10:46 AM  To page go to [email or web address]   [facility] -  Office  [phone]  See all Orders from today for further details    Objective:   Vitals:   08/17/20 1259 08/17/20 1632 08/17/20 1947 08/18/20 0502  BP: (!) 124/57 118/67 (!) 144/77 (!) 143/74  Pulse: 86 76 81 93  Resp: '18 17 18 '$ (!) 23  Temp:  98.1 F (36.7 C) 98.2 F (36.8 C) 98 F (36.7 C)  TempSrc:  Oral Oral Oral  SpO2: 99% 98%  98%  Weight:      Height:        Wt Readings from Last 3 Encounters:  08/15/20 70 kg  11/26/19 70.1 kg  09/13/19 68.9 kg    No intake or output data in the 24 hours ending 08/18/20 1046   Physical Exam  Awake Alert, No new F.N deficits, Normal affect [place].AT,PERRAL Supple Neck,No JVD, No cervical lymphadenopathy appriciated.  Symmetrical Chest wall movement, Good air movement bilaterally, CTAB RRR,No Gallops,Rubs or new Murmurs, No Parasternal Heave +ve B.Sounds, Abd Soft, No tenderness, No organomegaly appriciated, No rebound - guarding or rigidity. No Cyanosis, Clubbing or edema, No new Rash or bruise    Data Review:    CBC Recent Labs  Lab 08/15/20 1637 08/16/20 0754 08/17/20 0510 08/18/20 0428  WBC 16.4* 13.0* 17.7* 15.0*  HGB 14.3 14.3 14.8 14.1  HCT 44.1 45.1 46.3* 43.9  PLT 317 308 318 324  MCV 86.3 86.4 85.7 85.1  MCH 28.0 27.4 27.4 27.3  MCHC 32.4 31.7 32.0 32.1  RDW 14.9 14.9 14.7 14.5  LYMPHSABS 10.2*  --   --   --   MONOABS 0.7  --   --   --   EOSABS 0.0  --   --   --   BASOSABS 0.0  --   --   --     Recent Labs  Lab 08/15/20 1637 08/16/20 0624 08/16/20 0754 08/17/20 0510  NA 138  --  137 137  K 3.4*  --  4.1 3.8  CL 106  --  104 102  CO2 26  --  26 29  GLUCOSE 106*  --  86 116*  BUN 21*  --  22* 26*  CREATININE 1.16*  --  0.99 0.96  CALCIUM 8.6*  --  9.1 8.9  AST 13*  --  14*  --   ALT 16  --  17  --   ALKPHOS 64  --  62  --   BILITOT 0.6  --  0.7  --   ALBUMIN 3.6  --  3.6  --   MG  --   --  2.1  --    PROCALCITON  --   --   --  <0.10  INR  --   --  0.9  --   HGBA1C  --  6.1*  --   --     ------------------------------------------------------------------------------------------------------------------ Recent Labs    08/16/20 0624  CHOL 160  HDL 55  LDLCALC 81  TRIG 119  CHOLHDL 2.9    Lab Results  Component Value Date   HGBA1C 6.1 (H) 08/16/2020   ------------------------------------------------------------------------------------------------------------------  No results for input(s): TSH, T4TOTAL, T3FREE, THYROIDAB in the last 72 hours.  Invalid input(s): FREET3  Cardiac Enzymes No results for input(s): CKMB, TROPONINI, MYOGLOBIN in the last 168 hours.  Invalid input(s): CK ------------------------------------------------------------------------------------------------------------------ No results found for: BNP   Radiology Reports CT Head Wo Contrast  Result Date: 08/15/2020 CLINICAL DATA:  Transient ischemic attack (TIA) EXAM: CT HEAD WITHOUT CONTRAST TECHNIQUE: Contiguous axial images were obtained from the base of the skull through the vertex without intravenous contrast. COMPARISON:  None. FINDINGS: Brain: No evidence of acute large vascular territory infarction, hemorrhage, hydrocephalus, extra-axial collection or mass lesion/mass effect. Predominately periventricular white matter hypodensities, advanced for age. Vascular: No hyperdense vessel identified. Skull: No acute fracture. Sinuses/Orbits: Visualized sinuses are clear. No acute orbital findings. Other: No mastoid effusions. IMPRESSION: Age advanced predominately periventricular white matter hypodensities, which could be secondary to chronic microvascular ischemic disease or demyelination. Given the patient's neurologic symptoms, recommend MRI with contrast to further evaluate. Electronically Signed   By: Margaretha Sheffield MD   On: 08/15/2020 16:26   MR ANGIO HEAD WO CONTRAST  Result Date: 08/17/2020 CLINICAL  DATA:  TIA.  History of smoking. EXAM: MRA HEAD WITHOUT CONTRAST TECHNIQUE: Angiographic images of the Circle of Willis were acquired using MRA technique without intravenous contrast. COMPARISON:  Brain MRI from yesterday FINDINGS: Anterior circulation: Small left ICA in the upper neck with attenuated signal intensity throughout the cavernous sinus and left proximal MCA. No visible right ACA, which could be congenital or pathologic; no acute ACA territory infarcts on preceding brain MRI. Negative for aneurysm. Posterior circulation: Very diminutive right vertebral artery which effectively ends in the PICA. The basilar is widely patent. Significant fetal type contribution to the bilateral PCA with evidence of high-grade atheromatous narrowing at the left P1/2 junction. Less intense flow in the right PCA without discrete underlying stenosis Anatomic variants: As above IMPRESSION: 1. Attenuated/underfilling left ICA, suspect flow limiting stenosis more proximal in the neck. 2. Nonvisualized right A1 segment which could be developmental or pathologic, favor the latter. 3. High-grade narrowings at the left P1 2 junction, although the right PCA is under filled compared to the left. Electronically Signed   By: Monte Fantasia M.D.   On: 08/17/2020 11:56   MR BRAIN W WO CONTRAST  Result Date: 08/16/2020 CLINICAL DATA:  Neuro deficit, acute, stroke suspected. Speech disturbance. EXAM: MRI HEAD WITHOUT AND WITH CONTRAST TECHNIQUE: Multiplanar, multiecho pulse sequences of the brain and surrounding structures were obtained without and with intravenous contrast. CONTRAST:  7.24m GADAVIST GADOBUTROL 1 MMOL/ML IV SOLN COMPARISON:  Head CT 08/15/2020 FINDINGS: Brain: There are small clustered foci of restricted diffusion involving cortex and subcortical white matter in the posterior left frontal lobe with a small amount of associated enhancement there is also a punctate focus of diffusion abnormality and enhancement involving  subcortical white matter of the anterior left frontal lobe. No intracranial hemorrhage, mass, midline shift, or extra-axial fluid collection is identified. There is mild cerebral atrophy. There is widespread chronic cerebral white matter disease characterized by both small discrete and patchy foci of T2 FLAIR hyperintensity in the juxtacortical and deep cerebral white matter as well as more extensive and confluent T2 hyperintensity in the periventricular white matter including involvement of the temporal lobes and with prominent T1 hypointensity. Some of these are oriented perpendicularly to the lateral ventricles. There also small T2 hyperintensities in both cerebellar hemispheres. The brainstem is normal in signal. Vascular: Major intracranial vascular flow voids are preserved. Skull  and upper cervical spine: Unremarkable calvarial bone marrow signal. Cervical spine reported separately. Sinuses/Orbits: Unremarkable orbits. Paranasal sinuses and mastoid air cells are clear. Other: None. IMPRESSION: 1. Small foci of restricted diffusion and enhancement in the left frontal lobe most compatible with acute to early subacute MCA infarcts (as opposed to acute demyelination). 2. Advanced chronic white matter disease which may reflect demyelinating disease, chronic small vessel ischemia, or vasculitis. Electronically Signed   By: Logan Bores M.D.   On: 08/16/2020 12:40   MR Cervical Spine W or Wo Contrast  Result Date: 08/16/2020 CLINICAL DATA:  New onset of inability to speak yesterday. EXAM: MRI CERVICAL SPINE WITHOUT AND WITH CONTRAST TECHNIQUE: Multiplanar and multiecho pulse sequences of the cervical spine, to include the craniocervical junction and cervicothoracic junction, were obtained without and with intravenous contrast. CONTRAST:  7.14m GADAVIST GADOBUTROL 1 MMOL/ML IV SOLN COMPARISON:  MRI cervical spine 04/14/2019 FINDINGS: Alignment: The overall alignment is maintained. Age advanced degenerative cervical  spondylosis with mild multilevel degenerative subluxations. Moderate straightening of the cervical lordosis appears stable. Vertebrae: No acute bony findings or destructive bony changes. There are progressive endplate reactive changes at C3-4. No findings suspicious for discitis, osteomyelitis or epidural abscess. Cord: Normal cord signal intensity. No cord lesions, syrinx or abnormal areas of cord enhancement. Posterior Fossa, vertebral arteries, paraspinal tissues: No significant findings. Disc levels: C2-3: No significant findings. C3-4: Advanced and progressive degenerative disc disease with a bulging degenerated annulus, osteophytic ridging and uncinate spurring. There is flattening of the ventral thecal sac and obliteration of the ventral CSF space. There is also mild to moderate bilateral foraminal stenosis, left greater than right. C4-5: Advanced degenerative disc disease with a bulging degenerated annulus, osteophytic ridging and uncinate spurring. There is also asymmetric left-sided facet disease. Mass effect on the thecal sac asymmetric to the right with narrowing the ventral CSF space. No significant foraminal stenosis. C5-6: Advanced degenerate disc disease with a bulging degenerated annulus, osteophytic ridging and uncinate spurring. Flattening of the ventral thecal sac and narrowing the ventral CSF space. Mild left foraminal encroachment. C6-7: Interbody fusion changes. Mild osteophytic ridging but no spinal or foraminal stenosis. C7-T1: Uncinate spurring changes with mild foraminal encroachment bilaterally, left greater than right. Significant disc disease noted in the upper thoracic spine. IMPRESSION: 1. Age advanced degenerative cervical spondylosis with multilevel disc disease and facet disease as detailed above. 2. No cord lesions, syrinx or abnormal cord enhancement. 3. No findings suspicious for discitis, osteomyelitis or epidural abscess. 4. Mild to moderate spinal and bilateral foraminal  stenosis, left greater than right at C3-4. 5. Mild spinal and left foraminal encroachment at C5-6. 6. Interbody fusion changes at C6-7. No spinal or foraminal stenosis. 7. Advanced upper thoracic spine disc disease. Electronically Signed   By: PMarijo SanesM.D.   On: 08/16/2020 12:30   ECHOCARDIOGRAM COMPLETE  Result Date: 08/16/2020    ECHOCARDIOGRAM REPORT   Patient Name:   SKAMEREN REPINSKIDate of Exam: 08/16/2020 Medical Rec #:  [id number]       Height:       69.0 in Accession #:    [id number]      Weight:       154.3 lb Date of Birth:  410/17/1967       BSA:          1.850 m Patient Age:    89years         BP:  120/70 mmHg Patient Gender: F                HR:           70 bpm. Exam Location:  [facility] Procedure: 2D Echo Indications:    TIA  Sonographer:    Carl Best RN Referring Phys: [id number] OLADAPO ADEFESO IMPRESSIONS  1. Left ventricular ejection fraction, by estimation, is 55 to 60%. The left ventricle has normal function. The left ventricle has no regional wall motion abnormalities. There is mild left ventricular hypertrophy. Left ventricular diastolic parameters are indeterminate.  2. Right ventricular systolic function is normal. The right ventricular size is normal.  3. The mitral valve is normal in structure. No evidence of mitral valve regurgitation.  4. The aortic valve is normal in structure. Aortic valve regurgitation is not visualized.  5. The inferior vena cava is normal in size with greater than 50% respiratory variability, suggesting right atrial pressure of 3 mmHg. FINDINGS  Left Ventricle: Left ventricular ejection fraction, by estimation, is 55 to 60%. The left ventricle has normal function. The left ventricle has no regional wall motion abnormalities. The left ventricular internal cavity size was normal in size. There is  mild left ventricular hypertrophy. Left ventricular diastolic parameters are indeterminate. Right Ventricle: The right ventricular size is normal. Right  vetricular wall thickness was not assessed. Right ventricular systolic function is normal. Left Atrium: Left atrial size was normal in size. Right Atrium: Right atrial size was normal in size. Pericardium: Trivial pericardial effusion is present. Mitral Valve: The mitral valve is normal in structure. No evidence of mitral valve regurgitation. Tricuspid Valve: The tricuspid valve is normal in structure. Tricuspid valve regurgitation is trivial. Aortic Valve: The aortic valve is normal in structure. Aortic valve regurgitation is not visualized. Pulmonic Valve: The pulmonic valve was normal in structure. Pulmonic valve regurgitation is not visualized. Aorta: The aortic root and ascending aorta are structurally normal, with no evidence of dilitation. Venous: The inferior vena cava is normal in size with greater than 50% respiratory variability, suggesting right atrial pressure of 3 mmHg. IAS/Shunts: No atrial level shunt detected by color flow Doppler.  LEFT VENTRICLE PLAX 2D LVIDd:         3.70 cm  Diastology LVIDs:         2.90 cm  LV e' medial:    6.85 cm/s LV PW:         1.20 cm  LV E/e' medial:  15.5 LV IVS:        1.00 cm  LV e' lateral:   7.07 cm/s LVOT diam:     1.80 cm  LV E/e' lateral: 15.0 LV SV:         41 LV SV Index:   22 LVOT Area:     2.54 cm  RIGHT VENTRICLE            IVC RV Basal diam:  2.70 cm    IVC diam: 1.30 cm RV S prime:     6.85 cm/s TAPSE (M-mode): 1.7 cm LEFT ATRIUM             Index       RIGHT ATRIUM          Index LA diam:        3.30 cm 1.78 cm/m  RA Area:     9.66 cm LA Vol (A2C):   46.2 ml 24.97 ml/m RA Volume:   22.00 ml 11.89 ml/m LA Vol (A4C):  23.6 ml 12.75 ml/m LA Biplane Vol: 34.5 ml 18.64 ml/m  AORTIC VALVE LVOT Vmax:   85.40 cm/s LVOT Vmean:  55.100 cm/s LVOT VTI:    0.162 m  AORTA Ao Root diam: 2.70 cm Ao Asc diam:  2.30 cm MITRAL VALVE MV Area (PHT): 3.39 cm     SHUNTS MV Decel Time: 224 msec     Systemic VTI:  0.16 m MV E velocity: 106.00 cm/s  Systemic Diam: 1.80 cm  MV A velocity: 111.00 cm/s MV E/A ratio:  0.95 Dorris Carnes MD Electronically signed by Dorris Carnes MD Signature Date/Time: 08/16/2020/2:09:03 PM    Final    ECHOCARDIOGRAM LIMITED BUBBLE STUDY  Result Date: 08/17/2020    ECHOCARDIOGRAM LIMITED REPORT   Patient Name:   TERINA BRAIN Date of Exam: 08/17/2020 Medical Rec #:  [id number]        Height:       69.0 in Accession #:    [id number]       Weight:       154.3 lb Date of Birth:  October 20, 1965        BSA:          1.850 m Patient Age:    72 years         BP:           145/75 mmHg Patient Gender: F                HR:           79 bpm. Exam Location:  Inpatient Procedure: Limited Echo and Saline Contrast Bubble Study Indications:    Bubble study only  History:        Patient has prior history of Echocardiogram examinations, most                 recent 08/16/2020. Risk Factors:Hypertension.  Sonographer:    [place] Referring Phys: [id number] [place]  1. Limited exam for Bubble Study Negative for intracardiac shunt.  2. Agitated saline contrast bubble study was negative, with no evidence of any interatrial shunt. FINDINGS  IAS/Shunts: Agitated saline contrast was given intravenously to evaluate for intracardiac shunting. Agitated saline contrast bubble study was negative, with no evidence of any interatrial shunt. Dorris Carnes MD Electronically signed by Dorris Carnes MD Signature Date/Time: 08/17/2020/9:54:04 AM    Final    VAS US CAROTID  Result Date: 08/17/2020 Carotid Arterial Duplex Study Patient Name:  MATIANA MARTINCIC  Date of Exam:   08/17/2020 Medical Rec #: [id number]         Accession #:    [id number] Date of Birth: 1965-06-18         Patient Gender: F Patient Age:   38 years Exam Location:  [facility] Procedure:      VAS US CAROTID Referring Phys: Gevena Barre --------------------------------------------------------------------------------  Indications:       CVA and Speech disturbance. Comparison Study:  No previous exams  Performing Technologist: Jody Hill RVT, RDMS  Examination Guidelines: A complete evaluation includes B-mode imaging, spectral Doppler, color Doppler, and power Doppler as needed of all accessible portions of each vessel. Bilateral testing is considered an integral part of a complete examination. Limited examinations for reoccurring indications may be performed as noted.  Right Carotid Findings: +----------+--------+--------+--------+------------------+------------------+           PSV cm/sEDV cm/sStenosisPlaque DescriptionComments           +----------+--------+--------+--------+------------------+------------------+ CCA Prox  156  62                                intimal thickening +----------+--------+--------+--------+------------------+------------------+ CCA Distal71      22                                intimal thickening +----------+--------+--------+--------+------------------+------------------+ ICA Prox  81      32                                                   +----------+--------+--------+--------+------------------+------------------+ ICA Distal93      37                                                   +----------+--------+--------+--------+------------------+------------------+ ECA       83      11                                                   +----------+--------+--------+--------+------------------+------------------+ +----------+--------+-------+----------------+-------------------+           PSV cm/sEDV cmsDescribe        Arm Pressure (mmHG) +----------+--------+-------+----------------+-------------------+ [id number]            Multiphasic, WNL                    +----------+--------+-------+----------------+-------------------+ +---------+--------+--------+--------------+ VertebralPSV cm/sEDV cm/sNot identified +---------+--------+--------+--------------+  Left Carotid Findings:  +----------+--------+--------+--------+------------------+------------------+           PSV cm/sEDV cm/sStenosisPlaque DescriptionComments           +----------+--------+--------+--------+------------------+------------------+ CCA Prox  67      15                                intimal thickening +----------+--------+--------+--------+------------------+------------------+ CCA Distal62      14                                                   +----------+--------+--------+--------+------------------+------------------+ ICA Prox  702     408     80-99%  hypoechoic                           +----------+--------+--------+--------+------------------+------------------+ ICA Mid   74      23                                                   +----------+--------+--------+--------+------------------+------------------+ ICA Distal27      13                                                   +----------+--------+--------+--------+------------------+------------------+  ECA       96      16                                                   +----------+--------+--------+--------+------------------+------------------+ +----------+--------+--------+----------------+-------------------+           PSV cm/sEDV cm/sDescribe        Arm Pressure (mmHG) +----------+--------+--------+----------------+-------------------+ ON:[id number]             Multiphasic, WNL                    +----------+--------+--------+----------------+-------------------+ +---------+--------+--+--------+--+---------+ VertebralPSV cm/s71EDV cm/s33Antegrade +---------+--------+--+--------+--+---------+ Unable to obtained accurate PSV of proximal ICA due to level of stenosis.  Summary: Right Carotid: The extracranial vessels were near-normal with only minimal wall                thickening or plaque. Left Carotid: Velocities in the left ICA are consistent with a 80-99% stenosis. Vertebrals:  Left  vertebral artery demonstrates antegrade flow. Right vertebral              artery was not visualized. Subclavians: Normal flow hemodynamics were seen in bilateral subclavian              arteries. *See table(s) above for measurements and observations.  Vascular consult recommended. Electronically signed by Jamelle Haring on 08/17/2020 at 1:58:17 PM.    Final    CT ANGIO HEAD NECK W WO CM (CODE STROKE)  Result Date: 08/18/2020 CLINICAL DATA:  Stroke/TIA, assess extracranial arteries EXAM: CT ANGIOGRAPHY HEAD AND NECK TECHNIQUE: Multidetector CT imaging of the head and neck was performed using the standard protocol during bolus administration of intravenous contrast. Multiplanar CT image reconstructions and MIPs were obtained to evaluate the vascular anatomy. Carotid stenosis measurements (when applicable) are obtained utilizing NASCET criteria, using the distal internal carotid diameter as the denominator. CONTRAST:  125m OMNIPAQUE IOHEXOL 350 MG/ML SOLN COMPARISON:  08/15/2020 FINDINGS: CT HEAD FINDINGS Brain: There is no mass, hemorrhage or extra-axial collection. The size and configuration of the ventricles and extra-axial CSF spaces are normal. There is hypoattenuation of the periventricular white matter, most commonly indicating chronic ischemic microangiopathy. Skull: The visualized skull base, calvarium and extracranial soft tissues are normal. Sinuses/Orbits: No fluid levels or advanced mucosal thickening of the visualized paranasal sinuses. No mastoid or middle ear effusion. The orbits are normal. CTA NECK FINDINGS SKELETON: There is no bony spinal canal stenosis. No lytic or blastic lesion. OTHER NECK: Normal pharynx, larynx and major salivary glands. No cervical lymphadenopathy. Unremarkable thyroid gland. UPPER CHEST: No pneumothorax or pleural effusion. No nodules or masses. AORTIC ARCH: There is calcific atherosclerosis of the aortic arch. There is no aneurysm, dissection or hemodynamically significant  stenosis of the visualized portion of the aorta. Conventional 3 vessel aortic branching pattern. The visualized proximal subclavian arteries are widely patent. RIGHT CAROTID SYSTEM: Normal without aneurysm, dissection or stenosis. LEFT CAROTID SYSTEM: Atheromatous plaque at the left carotid bifurcation causing severe stenosis with angiographic string sign. The distal ICA is patent. VERTEBRAL ARTERIES: Left dominant configuration. The right vertebral artery is occluded proximally with reconstitution at the C4 level. There is no dissection, occlusion or flow-limiting stenosis to the skull base (V1-V3 segments). CTA HEAD FINDINGS POSTERIOR CIRCULATION: --Vertebral arteries: No enhancement of the diminutive right V4 segment normal left. --Inferior cerebellar arteries: Normal. --Basilar artery:  Normal. --Superior cerebellar arteries: Normal. --Posterior cerebral arteries (PCA): Normal. ANTERIOR CIRCULATION: --Intracranial internal carotid arteries: Normal. --Anterior cerebral arteries (ACA): Normal. Both A1 segments are present. Patent anterior communicating artery (a-comm). --Middle cerebral arteries (MCA): Normal. VENOUS SINUSES: As permitted by contrast timing, patent. ANATOMIC VARIANTS: Fetal origins of both posterior cerebral arteries. Review of the MIP images confirms the above findings. IMPRESSION: 1. No intracranial arterial occlusion or high-grade stenosis. 2. Severe stenosis of the proximal left internal carotid artery with angiographic string sign. 3. 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 Aortic Atherosclerosis (ICD10-I70.0). Electronically Signed   By: Ulyses Jarred M.D.   On: 08/18/2020 01:50

## 2020-08-18 NOTE — Plan of Care (Signed)
  Problem: Education: Goal: Knowledge of disease or condition will improve Outcome: Progressing Goal: Knowledge of secondary prevention will improve Outcome: Progressing Goal: Knowledge of patient specific risk factors addressed and post discharge goals established will improve Outcome: Progressing Goal: Individualized Educational Video(s) Outcome: Progressing   Problem: Coping: Goal: Will verbalize positive feelings about self Outcome: Progressing Goal: Will identify appropriate support needs Outcome: Progressing   Problem: Health Behavior/Discharge Planning: Goal: Ability to manage health-related needs will improve Outcome: Progressing   Problem: Self-Care: Goal: Ability to participate in self-care as condition permits will improve Outcome: Progressing Goal: Verbalization of feelings and concerns over difficulty with self-care will improve Outcome: Progressing Goal: Ability to communicate needs accurately will improve Outcome: Progressing   Problem: Nutrition: Goal: Risk of aspiration will decrease Outcome: Progressing Goal: Dietary intake will improve Outcome: Progressing   Problem: Intracerebral Hemorrhage Tissue Perfusion: Goal: Complications of Intracerebral Hemorrhage will be minimized Outcome: Progressing   Problem: Ischemic Stroke/TIA Tissue Perfusion: Goal: Complications of ischemic stroke/TIA will be minimized Outcome: Progressing   Problem: Spontaneous Subarachnoid Hemorrhage Tissue Perfusion: Goal: Complications of Spontaneous Subarachnoid Hemorrhage will be minimized Outcome: Progressing   Problem: Education: Goal: Knowledge of General Education information will improve Description: Including pain rating scale, medication(s)/side effects and non-pharmacologic comfort measures Outcome: Progressing   Problem: Health Behavior/Discharge Planning: Goal: Ability to manage health-related needs will improve Outcome: Progressing   Problem: Clinical  Measurements: Goal: Ability to maintain clinical measurements within normal limits will improve Outcome: Progressing Goal: Will remain free from infection Outcome: Progressing Goal: Diagnostic test results will improve Outcome: Progressing Goal: Respiratory complications will improve Outcome: Progressing Goal: Cardiovascular complication will be avoided Outcome: Progressing   Problem: Activity: Goal: Risk for activity intolerance will decrease Outcome: Progressing   Problem: Nutrition: Goal: Adequate nutrition will be maintained Outcome: Progressing   Problem: Coping: Goal: Level of anxiety will decrease Outcome: Progressing   Problem: Elimination: Goal: Will not experience complications related to bowel motility Outcome: Progressing Goal: Will not experience complications related to urinary retention Outcome: Progressing   Problem: Pain Managment: Goal: General experience of comfort will improve Outcome: Progressing   Problem: Safety: Goal: Ability to remain free from injury will improve Outcome: Progressing   Problem: Skin Integrity: Goal: Risk for impaired skin integrity will decrease Outcome: Progressing

## 2020-08-18 NOTE — Progress Notes (Signed)
[facility] [place] PROGRESS NOTE  ASSESSMENT / PLAN: [name] is a 55 y.o. female with symptomatic left carotid artery stenosis. Plan left carotid endarterectomy on Wednesday 08/20/20.   SUBJECTIVE: No interval changes. Discussed operative plan  OBJECTIVE: BP (!) 143/74 (BP Location: Left Arm)   Pulse 93   Temp 98 F (36.7 C) (Oral)   Resp (!) 23   Ht '5\' 9"'$  (1.753 m)   Wt 70 kg   SpO2 98%   BMI 22.79 kg/m  No intake or output data in the 24 hours ending 08/18/20 1252   No distress. Eating lunch. Subtle, if any L hand weakness is unchanged.  CBC Latest Ref Rng & Units 08/18/2020 08/17/2020 08/16/2020  WBC 4.0 - 10.5 K/uL 15.0(H) 17.7(H) 13.0(H)  Hemoglobin 12.0 - 15.0 g/dL 14.1 14.8 14.3  Hematocrit 36.0 - 46.0 % 43.9 46.3(H) 45.1  Platelets 150 - 400 K/uL 324 318 308     CMP Latest Ref Rng & Units 08/17/2020 08/16/2020 08/15/2020  Glucose 70 - 99 mg/dL 116(H) 86 106(H)  BUN 6 - 20 mg/dL 26(H) 22(H) 21(H)  Creatinine 0.44 - 1.00 mg/dL 0.96 0.99 1.16(H)  Sodium 135 - 145 mmol/L 137 137 138  Potassium 3.5 - 5.1 mmol/L 3.8 4.1 3.4(L)  Chloride 98 - 111 mmol/L 102 104 106  CO2 22 - 32 mmol/L '29 26 26  '$ Calcium 8.9 - 10.3 mg/dL 8.9 9.1 8.6(L)  Total Protein 6.5 - 8.1 g/dL - 6.8 6.6  Total Bilirubin 0.3 - 1.2 mg/dL - 0.7 0.6  Alkaline Phos 38 - 126 U/L - 62 64  AST 15 - 41 U/L - 14(L) 13(L)  ALT 0 - 44 U/L - 17 16    Estimated Creatinine Clearance: 68.4 mL/min (by C-G formula based on SCr of 0.96 mg/dL).  CTA personally reviewed - anatomy appears favorable for CEA.  [name]. Stanford Breed, MD [facility] Phone Number: [phone] 08/18/2020 12:52 PM

## 2020-08-19 ENCOUNTER — Encounter (HOSPITAL_COMMUNITY): Payer: Self-pay | Admitting: Internal Medicine

## 2020-08-19 DIAGNOSIS — I6522 Occlusion and stenosis of left carotid artery: Secondary | ICD-10-CM

## 2020-08-19 DIAGNOSIS — Z720 Tobacco use: Secondary | ICD-10-CM | POA: Diagnosis not present

## 2020-08-19 DIAGNOSIS — D72829 Elevated white blood cell count, unspecified: Secondary | ICD-10-CM | POA: Diagnosis not present

## 2020-08-19 DIAGNOSIS — I639 Cerebral infarction, unspecified: Secondary | ICD-10-CM | POA: Diagnosis not present

## 2020-08-19 DIAGNOSIS — E782 Mixed hyperlipidemia: Secondary | ICD-10-CM | POA: Diagnosis not present

## 2020-08-19 LAB — PATHOLOGIST SMEAR REVIEW

## 2020-08-19 LAB — RAPID URINE DRUG SCREEN, HOSP PERFORMED
Amphetamines: NOT DETECTED
Barbiturates: NOT DETECTED
Benzodiazepines: NOT DETECTED
Cocaine: NOT DETECTED
Opiates: NOT DETECTED
Tetrahydrocannabinol: NOT DETECTED

## 2020-08-19 MED ORDER — POLYVINYL ALCOHOL 1.4 % OP SOLN
1.0000 [drp] | OPHTHALMIC | Status: DC | PRN
  Filled 2020-08-19 (×2): qty 15

## 2020-08-19 MED ORDER — POLYETHYLENE GLYCOL 3350 17 G PO PACK
17.0000 g | PACK | Freq: Two times a day (BID) | ORAL | Status: DC
  Administered 2020-08-19 – 2020-08-21 (×4): 17 g via ORAL
  Filled 2020-08-19 (×4): qty 1

## 2020-08-19 NOTE — Progress Notes (Signed)
[facility] [place] PROGRESS NOTE  ASSESSMENT / PLAN: [name] is a 55 y.o. female with symptomatic left carotid artery stenosis. Plan left carotid endarterectomy on Wednesday 08/20/20.   SUBJECTIVE: No interval changes. Discussed operative plan  OBJECTIVE: BP 123/78 (BP Location: Right Arm)   Pulse 70   Temp 97.7 F (36.5 C) (Oral)   Resp 20   Ht '5\' 9"'$  (1.753 m)   Wt 70 kg   SpO2 99%   BMI 22.79 kg/m  No intake or output data in the 24 hours ending 08/19/20 1912   No distress. Visiting with family Neurologic exam unchanged.  CBC Latest Ref Rng & Units 08/18/2020 08/17/2020 08/16/2020  WBC 4.0 - 10.5 K/uL 15.0(H) 17.7(H) 13.0(H)  Hemoglobin 12.0 - 15.0 g/dL 14.1 14.8 14.3  Hematocrit 36.0 - 46.0 % 43.9 46.3(H) 45.1  Platelets 150 - 400 K/uL 324 318 308     CMP Latest Ref Rng & Units 08/17/2020 08/16/2020 08/15/2020  Glucose 70 - 99 mg/dL 116(H) 86 106(H)  BUN 6 - 20 mg/dL 26(H) 22(H) 21(H)  Creatinine 0.44 - 1.00 mg/dL 0.96 0.99 1.16(H)  Sodium 135 - 145 mmol/L 137 137 138  Potassium 3.5 - 5.1 mmol/L 3.8 4.1 3.4(L)  Chloride 98 - 111 mmol/L 102 104 106  CO2 22 - 32 mmol/L '29 26 26  '$ Calcium 8.9 - 10.3 mg/dL 8.9 9.1 8.6(L)  Total Protein 6.5 - 8.1 g/dL - 6.8 6.6  Total Bilirubin 0.3 - 1.2 mg/dL - 0.7 0.6  Alkaline Phos 38 - 126 U/L - 62 64  AST 15 - 41 U/L - 14(L) 13(L)  ALT 0 - 44 U/L - 17 16    Estimated Creatinine Clearance: 68.4 mL/min (by C-G formula based on SCr of 0.96 mg/dL).  CTA personally reviewed - anatomy appears favorable for CEA.  [name]. Stanford Breed, MD [facility] Phone Number: [phone] 08/19/2020 7:12 PM

## 2020-08-19 NOTE — Anesthesia Preprocedure Evaluation (Addendum)
Anesthesia Evaluation  Patient identified by MRN, date of birth, ID band  Reviewed: Allergy & Precautions, NPO status , Patient's Chart, lab work & pertinent test results  Airway Mallampati: III  TM Distance: >3 FB Neck ROM: Full    Dental no notable dental hx. (+) Dental Advisory Given   Pulmonary pneumonia, COPD, Current Smoker and Patient abstained from smoking.,    Pulmonary exam normal breath sounds clear to auscultation       Cardiovascular hypertension, Pt. on medications Normal cardiovascular exam+ Valvular Problems/Murmurs  Rhythm:Regular Rate:Normal     Neuro/Psych PSYCHIATRIC DISORDERS Bipolar Disorder CVA    GI/Hepatic negative GI ROS, Neg liver ROS,   Endo/Other  negative endocrine ROS  Renal/GU Renal disease     Musculoskeletal negative musculoskeletal ROS (+)   Abdominal   Peds  Hematology negative hematology ROS (+)   Anesthesia Other Findings   Reproductive/Obstetrics                            Anesthesia Physical Anesthesia Plan  ASA: 3  Anesthesia Plan: General   Post-op Pain Management:    Induction: Intravenous  PONV Risk Score and Plan: 3 and Ondansetron, Dexamethasone, Midazolam and Treatment may vary due to age or medical condition  Airway Management Planned: Oral ETT  Additional Equipment: Arterial line  Intra-op Plan:   Post-operative Plan: Extubation in OR  Informed Consent: I have reviewed the patients History and Physical, chart, labs and discussed the procedure including the risks, benefits and alternatives for the proposed anesthesia with the patient or authorized representative who has indicated his/her understanding and acceptance.     Dental advisory given  Plan Discussed with: CRNA  Anesthesia Plan Comments:        Anesthesia Quick Evaluation

## 2020-08-19 NOTE — Progress Notes (Signed)
PROGRESS NOTE                                                                                                                                                                                                             Patient Demographics:    [name], is a 55 y.o. female, DOB - November 08, 1965, [id number]  Outpatient Primary MD for the patient is Delora Fuel, MD    LOS - 1  Admit date - 08/15/2020    Chief Complaint  Patient presents with   Transient Ischemic Attack       Brief Narrative (HPI from H&P) - 55 year old female with past medical history for hyperlipidemia, hypertension, bipolar disorder and tobacco abuse presented to the emergency room on 8/5 with sudden onset of word finding difficulty, her work-up was suggestive of left MCA infarct along with left carotid artery stenosis and she was admitted to the hospital for stroke work-up and care   Subjective:   Patient in bed, appears comfortable, denies any headache, no fever, no chest pain or pressure, no shortness of breath , no abdominal pain. No new focal weakness.    Assessment  & Plan :     Acute left MCA ischemic infarct along with critical left ICA stenosis - her 3 seems to have resolved and currently has no deficits, on dual antiplatelet therapy and statin, neuro team and vascular surgery following.  Will require left ICA surgical intervention on 08/20/20.  Defer further management to neurology and vascular surgery.  Stroke work-up in pathway per neurology.  A1c was 6.1, LDL was 81 above goal hence placed on statin, echo stable. NPO after midnight, Type - Screen for AM ordered.  2.  Left ICA critical stenosis.  Currently on dual antiplatelet therapy and high intensity statin for secondary prevention, vascular surgery contemplating surgical intervention this admission.  3.  Hypertension.  Allow for permissive hypertension in the light of acute stroke for  the next 2 to 3 days.  4.  Dyslipidemia.  On Lipitor 80 mg increased from 40 for better control   5.  Chronic low back pain.  Supportive care.  6.  Ongoing smoking.  Counseled to quit.  7.  History of bipolar disorder.  Stable no acute issues continue Lamictal.   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       Condition - Fair  Family Communication  :  None Present  Code Status :  Full  Consults  :  Neuro, VVS  PUD Prophylaxis :     Procedures  :     MRI- A - Brain  1. Small foci of restricted diffusion and enhancement in the left frontal lobe most compatible with acute to early subacute MCA infarcts (as opposed to acute demyelination). 2. Advanced chronic white matter disease which may reflect demyelinating disease, chronic small vessel ischemia, or vasculitis.  1. Attenuated/underfilling left ICA, suspect flow limiting stenosis more proximal in the neck. 2. Nonvisualized right A1 segment which could be developmental or pathologic, favor the latter. 3. High-grade narrowings at the left P1 2 junction, although the right PCA is under filled compared to the left.  MRI C Spine -  1. Age advanced degenerative cervical spondylosis with multilevel disc disease and facet disease as detailed above. 2. No cord lesions, syrinx or abnormal cord enhancement. 3. No findings suspicious for discitis, osteomyelitis or epidural abscess. 4. Mild to moderate spinal and bilateral foraminal stenosis, left greater than right at C3-4. 5. Mild spinal and left foraminal encroachment at C5-6. 6. Interbody fusion changes at C6-7. No spinal or foraminal stenosis. 7. Advanced upper thoracic spine disc disease.  Carotid US - Right Carotid: The extracranial vessels were near-normal with only minimal wall thickening or plaque. Left Carotid: Velocities in the left ICA are consistent with a 80-99% stenosis. Vertebrals:  Left vertebral artery demonstrates antegrade flow.  Right vertebral artery was not visualized. Subclavians: Normal flow hemodynamics were seen in bilateral subclavian arteries.  CTA  - 1. No intracranial arterial occlusion or high-grade stenosis. 2. Severe stenosis of the proximal left internal carotid artery with angiographic string sign. 3. 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 Aortic Atherosclerosis (ICD10-I70.0).  TTE - 1. Left ventricular ejection fraction, by estimation, is 55 to 60%. The left ventricle has normal function. The left ventricle has no regional wall motion abnormalities. There is mild left ventricular hypertrophy. Left ventricular diastolic parameters are indeterminate.  2. Right ventricular systolic function is normal. The right ventricular size is normal.  3. The mitral valve is normal in structure. No evidence of mitral valve regurgitation.  4. The aortic valve is normal in structure. Aortic valve regurgitation is not visualized.  5. The inferior vena cava is normal in size with greater than 50% respiratory variability, suggesting right atrial pressure of 3 mmHg.      Disposition Plan  :    Status is: Observation  Dispo: The patient is from: Home              Anticipated d/c is to: Home              Patient currently is not medically stable to d/c.   Difficult to place patient No  DVT Prophylaxis  :  Heparin added  heparin injection 5,000 Units Start: 08/18/20 1400 SCDs Start: 08/16/20 0754  Lab Results  Component Value Date   PLT 324 08/18/2020    Diet :  Diet Order             Diet NPO time specified  Diet effective midnight           Diet Heart Room service appropriate? Yes with Assist; Fluid consistency: Thin  Diet effective now  Inpatient Medications  Scheduled Meds:  aspirin EC  81 mg Oral Daily   atorvastatin  80 mg Oral Daily   clopidogrel  75 mg Oral Daily   heparin injection (subcutaneous)  5,000 Units Subcutaneous Q8H    lamoTRIgine  200 mg Oral Daily   nicotine  14 mg Transdermal Daily   traZODone  100 mg Oral QHS   Continuous Infusions: PRN Meds:.acetaminophen, albuterol  Antibiotics  :    Anti-infectives (From admission, onward)    None        Time Spent in minutes  30   Lala Lund M.D on 08/19/2020 at 11:39 AM  To page go to [email or web address]   [facility] -  Office  [phone]  See all Orders from today for further details    Objective:   Vitals:   08/18/20 0502 08/18/20 1529 08/18/20 2200 08/19/20 0600  BP: (!) 143/74 127/67 139/62 123/78  Pulse: 93 88 76 70  Resp: (!) '23 17 20 20  '$ Temp: 98 F (36.7 C) (!) 97.5 F (36.4 C) 97.7 F (36.5 C) 97.7 F (36.5 C)  TempSrc: Oral Oral Oral Oral  SpO2: 98% 100% 99%   Weight:      Height:        Wt Readings from Last 3 Encounters:  08/15/20 70 kg  11/26/19 70.1 kg  09/13/19 68.9 kg    No intake or output data in the 24 hours ending 08/19/20 1139   Physical Exam  Awake Alert, No new F.N deficits, Normal affect [place].AT,PERRAL Supple Neck,No JVD, No cervical lymphadenopathy appriciated.  Symmetrical Chest wall movement, Good air movement bilaterally, CTAB RRR,No Gallops, Rubs or new Murmurs, No Parasternal Heave +ve B.Sounds, Abd Soft, No tenderness, No organomegaly appriciated, No rebound - guarding or rigidity. No Cyanosis, Clubbing or edema, No new Rash or bruise    Data Review:    CBC Recent Labs  Lab 08/15/20 1637 08/16/20 0754 08/17/20 0510 08/18/20 0428  WBC 16.4* 13.0* 17.7* 15.0*  HGB 14.3 14.3 14.8 14.1  HCT 44.1 45.1 46.3* 43.9  PLT 317 308 318 324  MCV 86.3 86.4 85.7 85.1  MCH 28.0 27.4 27.4 27.3  MCHC 32.4 31.7 32.0 32.1  RDW 14.9 14.9 14.7 14.5  LYMPHSABS 10.2*  --   --   --   MONOABS 0.7  --   --   --   EOSABS 0.0  --   --   --   BASOSABS 0.0  --   --   --     Recent Labs  Lab 08/15/20 1637 08/16/20 0624 08/16/20 0754 08/17/20 0510  NA 138  --  137 137  K 3.4*  --  4.1 3.8   CL 106  --  104 102  CO2 26  --  26 29  GLUCOSE 106*  --  86 116*  BUN 21*  --  22* 26*  CREATININE 1.16*  --  0.99 0.96  CALCIUM 8.6*  --  9.1 8.9  AST 13*  --  14*  --   ALT 16  --  17  --   ALKPHOS 64  --  62  --   BILITOT 0.6  --  0.7  --   ALBUMIN 3.6  --  3.6  --   MG  --   --  2.1  --   PROCALCITON  --   --   --  <0.10  INR  --   --  0.9  --   HGBA1C  --  6.1*  --   --     ------------------------------------------------------------------------------------------------------------------ No results for input(s): CHOL, HDL, LDLCALC, TRIG, CHOLHDL, LDLDIRECT in the last 72 hours.   Lab Results  Component Value Date   HGBA1C 6.1 (H) 08/16/2020   ------------------------------------------------------------------------------------------------------------------ No results for input(s): TSH, T4TOTAL, T3FREE, THYROIDAB in the last 72 hours.  Invalid input(s): FREET3  Cardiac Enzymes No results for input(s): CKMB, TROPONINI, MYOGLOBIN in the last 168 hours.  Invalid input(s): CK ------------------------------------------------------------------------------------------------------------------ No results found for: BNP   Radiology Reports CT Head Wo Contrast  Result Date: 08/15/2020 CLINICAL DATA:  Transient ischemic attack (TIA) EXAM: CT HEAD WITHOUT CONTRAST TECHNIQUE: Contiguous axial images were obtained from the base of the skull through the vertex without intravenous contrast. COMPARISON:  None. FINDINGS: Brain: No evidence of acute large vascular territory infarction, hemorrhage, hydrocephalus, extra-axial collection or mass lesion/mass effect. Predominately periventricular white matter hypodensities, advanced for age. Vascular: No hyperdense vessel identified. Skull: No acute fracture. Sinuses/Orbits: Visualized sinuses are clear. No acute orbital findings. Other: No mastoid effusions. IMPRESSION: Age advanced predominately periventricular white matter hypodensities, which  could be secondary to chronic microvascular ischemic disease or demyelination. Given the patient's neurologic symptoms, recommend MRI with contrast to further evaluate. Electronically Signed   By: Margaretha Sheffield MD   On: 08/15/2020 16:26   MR ANGIO HEAD WO CONTRAST  Result Date: 08/17/2020 CLINICAL DATA:  TIA.  History of smoking. EXAM: MRA HEAD WITHOUT CONTRAST TECHNIQUE: Angiographic images of the Circle of Willis were acquired using MRA technique without intravenous contrast. COMPARISON:  Brain MRI from yesterday FINDINGS: Anterior circulation: Small left ICA in the upper neck with attenuated signal intensity throughout the cavernous sinus and left proximal MCA. No visible right ACA, which could be congenital or pathologic; no acute ACA territory infarcts on preceding brain MRI. Negative for aneurysm. Posterior circulation: Very diminutive right vertebral artery which effectively ends in the PICA. The basilar is widely patent. Significant fetal type contribution to the bilateral PCA with evidence of high-grade atheromatous narrowing at the left P1/2 junction. Less intense flow in the right PCA without discrete underlying stenosis Anatomic variants: As above IMPRESSION: 1. Attenuated/underfilling left ICA, suspect flow limiting stenosis more proximal in the neck. 2. Nonvisualized right A1 segment which could be developmental or pathologic, favor the latter. 3. High-grade narrowings at the left P1 2 junction, although the right PCA is under filled compared to the left. Electronically Signed   By: Monte Fantasia M.D.   On: 08/17/2020 11:56   MR BRAIN W WO CONTRAST  Result Date: 08/16/2020 CLINICAL DATA:  Neuro deficit, acute, stroke suspected. Speech disturbance. EXAM: MRI HEAD WITHOUT AND WITH CONTRAST TECHNIQUE: Multiplanar, multiecho pulse sequences of the brain and surrounding structures were obtained without and with intravenous contrast. CONTRAST:  7.97m GADAVIST GADOBUTROL 1 MMOL/ML IV SOLN  COMPARISON:  Head CT 08/15/2020 FINDINGS: Brain: There are small clustered foci of restricted diffusion involving cortex and subcortical white matter in the posterior left frontal lobe with a small amount of associated enhancement there is also a punctate focus of diffusion abnormality and enhancement involving subcortical white matter of the anterior left frontal lobe. No intracranial hemorrhage, mass, midline shift, or extra-axial fluid collection is identified. There is mild cerebral atrophy. There is widespread chronic cerebral white matter disease characterized by both small discrete and patchy foci of T2 FLAIR hyperintensity in the juxtacortical and deep cerebral white matter as well as more extensive and confluent T2 hyperintensity in the periventricular white matter including involvement  of the temporal lobes and with prominent T1 hypointensity. Some of these are oriented perpendicularly to the lateral ventricles. There also small T2 hyperintensities in both cerebellar hemispheres. The brainstem is normal in signal. Vascular: Major intracranial vascular flow voids are preserved. Skull and upper cervical spine: Unremarkable calvarial bone marrow signal. Cervical spine reported separately. Sinuses/Orbits: Unremarkable orbits. Paranasal sinuses and mastoid air cells are clear. Other: None. IMPRESSION: 1. Small foci of restricted diffusion and enhancement in the left frontal lobe most compatible with acute to early subacute MCA infarcts (as opposed to acute demyelination). 2. Advanced chronic white matter disease which may reflect demyelinating disease, chronic small vessel ischemia, or vasculitis. Electronically Signed   By: Logan Bores M.D.   On: 08/16/2020 12:40   MR Cervical Spine W or Wo Contrast  Result Date: 08/16/2020 CLINICAL DATA:  New onset of inability to speak yesterday. EXAM: MRI CERVICAL SPINE WITHOUT AND WITH CONTRAST TECHNIQUE: Multiplanar and multiecho pulse sequences of the cervical spine,  to include the craniocervical junction and cervicothoracic junction, were obtained without and with intravenous contrast. CONTRAST:  7.45m GADAVIST GADOBUTROL 1 MMOL/ML IV SOLN COMPARISON:  MRI cervical spine 04/14/2019 FINDINGS: Alignment: The overall alignment is maintained. Age advanced degenerative cervical spondylosis with mild multilevel degenerative subluxations. Moderate straightening of the cervical lordosis appears stable. Vertebrae: No acute bony findings or destructive bony changes. There are progressive endplate reactive changes at C3-4. No findings suspicious for discitis, osteomyelitis or epidural abscess. Cord: Normal cord signal intensity. No cord lesions, syrinx or abnormal areas of cord enhancement. Posterior Fossa, vertebral arteries, paraspinal tissues: No significant findings. Disc levels: C2-3: No significant findings. C3-4: Advanced and progressive degenerative disc disease with a bulging degenerated annulus, osteophytic ridging and uncinate spurring. There is flattening of the ventral thecal sac and obliteration of the ventral CSF space. There is also mild to moderate bilateral foraminal stenosis, left greater than right. C4-5: Advanced degenerative disc disease with a bulging degenerated annulus, osteophytic ridging and uncinate spurring. There is also asymmetric left-sided facet disease. Mass effect on the thecal sac asymmetric to the right with narrowing the ventral CSF space. No significant foraminal stenosis. C5-6: Advanced degenerate disc disease with a bulging degenerated annulus, osteophytic ridging and uncinate spurring. Flattening of the ventral thecal sac and narrowing the ventral CSF space. Mild left foraminal encroachment. C6-7: Interbody fusion changes. Mild osteophytic ridging but no spinal or foraminal stenosis. C7-T1: Uncinate spurring changes with mild foraminal encroachment bilaterally, left greater than right. Significant disc disease noted in the upper thoracic spine.  IMPRESSION: 1. Age advanced degenerative cervical spondylosis with multilevel disc disease and facet disease as detailed above. 2. No cord lesions, syrinx or abnormal cord enhancement. 3. No findings suspicious for discitis, osteomyelitis or epidural abscess. 4. Mild to moderate spinal and bilateral foraminal stenosis, left greater than right at C3-4. 5. Mild spinal and left foraminal encroachment at C5-6. 6. Interbody fusion changes at C6-7. No spinal or foraminal stenosis. 7. Advanced upper thoracic spine disc disease. Electronically Signed   By: PMarijo SanesM.D.   On: 08/16/2020 12:30   ECHOCARDIOGRAM COMPLETE  Result Date: 08/16/2020    ECHOCARDIOGRAM REPORT   Patient Name:   Kimberly ZETTLEMOYERDate of Exam: 08/16/2020 Medical Rec #:  [id number]       Height:       69.0 in Accession #:    [id number]      Weight:       154.3 lb Date of Birth:  [birth year]  BSA:          1.850 m Patient Age:    74 years         BP:           120/70 mmHg Patient Gender: F                HR:           70 bpm. Exam Location:  [facility] Procedure: 2D Echo Indications:    TIA  Sonographer:    Carl Best RN Referring Phys: [id number] OLADAPO ADEFESO IMPRESSIONS  1. Left ventricular ejection fraction, by estimation, is 55 to 60%. The left ventricle has normal function. The left ventricle has no regional wall motion abnormalities. There is mild left ventricular hypertrophy. Left ventricular diastolic parameters are indeterminate.  2. Right ventricular systolic function is normal. The right ventricular size is normal.  3. The mitral valve is normal in structure. No evidence of mitral valve regurgitation.  4. The aortic valve is normal in structure. Aortic valve regurgitation is not visualized.  5. The inferior vena cava is normal in size with greater than 50% respiratory variability, suggesting right atrial pressure of 3 mmHg. FINDINGS  Left Ventricle: Left ventricular ejection fraction, by estimation, is 55 to 60%. The left  ventricle has normal function. The left ventricle has no regional wall motion abnormalities. The left ventricular internal cavity size was normal in size. There is  mild left ventricular hypertrophy. Left ventricular diastolic parameters are indeterminate. Right Ventricle: The right ventricular size is normal. Right vetricular wall thickness was not assessed. Right ventricular systolic function is normal. Left Atrium: Left atrial size was normal in size. Right Atrium: Right atrial size was normal in size. Pericardium: Trivial pericardial effusion is present. Mitral Valve: The mitral valve is normal in structure. No evidence of mitral valve regurgitation. Tricuspid Valve: The tricuspid valve is normal in structure. Tricuspid valve regurgitation is trivial. Aortic Valve: The aortic valve is normal in structure. Aortic valve regurgitation is not visualized. Pulmonic Valve: The pulmonic valve was normal in structure. Pulmonic valve regurgitation is not visualized. Aorta: The aortic root and ascending aorta are structurally normal, with no evidence of dilitation. Venous: The inferior vena cava is normal in size with greater than 50% respiratory variability, suggesting right atrial pressure of 3 mmHg. IAS/Shunts: No atrial level shunt detected by color flow Doppler.  LEFT VENTRICLE PLAX 2D LVIDd:         3.70 cm  Diastology LVIDs:         2.90 cm  LV e' medial:    6.85 cm/s LV PW:         1.20 cm  LV E/e' medial:  15.5 LV IVS:        1.00 cm  LV e' lateral:   7.07 cm/s LVOT diam:     1.80 cm  LV E/e' lateral: 15.0 LV SV:         41 LV SV Index:   22 LVOT Area:     2.54 cm  RIGHT VENTRICLE            IVC RV Basal diam:  2.70 cm    IVC diam: 1.30 cm RV S prime:     6.85 cm/s TAPSE (M-mode): 1.7 cm LEFT ATRIUM             Index       RIGHT ATRIUM          Index LA diam:  3.30 cm 1.78 cm/m  RA Area:     9.66 cm LA Vol (A2C):   46.2 ml 24.97 ml/m RA Volume:   22.00 ml 11.89 ml/m LA Vol (A4C):   23.6 ml 12.75 ml/m  LA Biplane Vol: 34.5 ml 18.64 ml/m  AORTIC VALVE LVOT Vmax:   85.40 cm/s LVOT Vmean:  55.100 cm/s LVOT VTI:    0.162 m  AORTA Ao Root diam: 2.70 cm Ao Asc diam:  2.30 cm MITRAL VALVE MV Area (PHT): 3.39 cm     SHUNTS MV Decel Time: 224 msec     Systemic VTI:  0.16 m MV E velocity: 106.00 cm/s  Systemic Diam: 1.80 cm MV A velocity: 111.00 cm/s MV E/A ratio:  0.95 Dorris Carnes MD Electronically signed by Dorris Carnes MD Signature Date/Time: 08/16/2020/2:09:03 PM    Final    ECHOCARDIOGRAM LIMITED BUBBLE STUDY  Result Date: 08/17/2020    ECHOCARDIOGRAM LIMITED REPORT   Patient Name:   [name] Date of Exam: 08/17/2020 Medical Rec #:  [id number]        Height:       69.0 in Accession #:    [id number]       Weight:       154.3 lb Date of Birth:  Aug 06, 1965        BSA:          1.850 m Patient Age:    63 years         BP:           145/75 mmHg Patient Gender: F                HR:           79 bpm. Exam Location:  Inpatient Procedure: Limited Echo and Saline Contrast Bubble Study Indications:    Bubble study only  History:        Patient has prior history of Echocardiogram examinations, most                 recent 08/16/2020. Risk Factors:Hypertension.  Sonographer:    Keyser Referring Phys: [id number] [place]  1. Limited exam for Bubble Study Negative for intracardiac shunt.  2. Agitated saline contrast bubble study was negative, with no evidence of any interatrial shunt. FINDINGS  IAS/Shunts: Agitated saline contrast was given intravenously to evaluate for intracardiac shunting. Agitated saline contrast bubble study was negative, with no evidence of any interatrial shunt. Dorris Carnes MD Electronically signed by Dorris Carnes MD Signature Date/Time: 08/17/2020/9:54:04 AM    Final    VAS US CAROTID  Result Date: 08/17/2020 Carotid Arterial Duplex Study Patient Name:  [name]  Date of Exam:   08/17/2020 Medical Rec #: [id number]         Accession #:    [id number] Date of Birth: October 16, 1965          Patient Gender: F Patient Age:   35 years Exam Location[facility]  [facility] Procedure:      VAS US CAROTID Referring Phys: Gevena Barre --------------------------------------------------------------------------------  Indications:       CVA and Speech disturbance. Comparison Study:  No previous exams Performing Technologist: Jody Hill RVT, RDMS  Examination Guidelines: A complete evaluation includes B-mode imaging, spectral Doppler, color Doppler, and power Doppler as needed of all accessible portions of each vessel. Bilateral testing is considered an integral part of a complete examination. Limited examinations for reoccurring indications may be performed as noted.  Right  Carotid Findings: +----------+--------+--------+--------+------------------+------------------+           PSV cm/sEDV cm/sStenosisPlaque DescriptionComments           +----------+--------+--------+--------+------------------+------------------+ CCA Prox  156     62                                intimal thickening +----------+--------+--------+--------+------------------+------------------+ CCA Distal71      22                                intimal thickening +----------+--------+--------+--------+------------------+------------------+ ICA Prox  81      32                                                   +----------+--------+--------+--------+------------------+------------------+ ICA Distal93      37                                                   +----------+--------+--------+--------+------------------+------------------+ ECA       83      11                                                   +----------+--------+--------+--------+------------------+------------------+ +----------+--------+-------+----------------+-------------------+           PSV cm/sEDV cmsDescribe        Arm Pressure (mmHG) +----------+--------+-------+----------------+-------------------+ [id number]             Multiphasic, WNL                    +----------+--------+-------+----------------+-------------------+ +---------+--------+--------+--------------+ VertebralPSV cm/sEDV cm/sNot identified +---------+--------+--------+--------------+  Left Carotid Findings: +----------+--------+--------+--------+------------------+------------------+           PSV cm/sEDV cm/sStenosisPlaque DescriptionComments           +----------+--------+--------+--------+------------------+------------------+ CCA Prox  67      15                                intimal thickening +----------+--------+--------+--------+------------------+------------------+ CCA Distal62      14                                                   +----------+--------+--------+--------+------------------+------------------+ ICA Prox  702     408     80-99%  hypoechoic                           +----------+--------+--------+--------+------------------+------------------+ ICA Mid   74      23                                                   +----------+--------+--------+--------+------------------+------------------+  ICA Distal27      13                                                   +----------+--------+--------+--------+------------------+------------------+ ECA       96      16                                                   +----------+--------+--------+--------+------------------+------------------+ +----------+--------+--------+----------------+-------------------+           PSV cm/sEDV cm/sDescribe        Arm Pressure (mmHG) +----------+--------+--------+----------------+-------------------+ [id number]             Multiphasic, WNL                    +----------+--------+--------+----------------+-------------------+ +---------+--------+--+--------+--+---------+ VertebralPSV cm/s71EDV cm/s33Antegrade +---------+--------+--+--------+--+---------+ Unable to obtained accurate PSV  of proximal ICA due to level of stenosis.  Summary: Right Carotid: The extracranial vessels were near-normal with only minimal wall                thickening or plaque. Left Carotid: Velocities in the left ICA are consistent with a 80-99% stenosis. Vertebrals:  Left vertebral artery demonstrates antegrade flow. Right vertebral              artery was not visualized. Subclavians: Normal flow hemodynamics were seen in bilateral subclavian              arteries. *See table(s) above for measurements and observations.  Vascular consult recommended. Electronically signed by Jamelle Haring on 08/17/2020 at 1:58:17 PM.    Final    CT ANGIO HEAD NECK W WO CM (CODE STROKE)  Result Date: 08/18/2020 CLINICAL DATA:  Stroke/TIA, assess extracranial arteries EXAM: CT ANGIOGRAPHY HEAD AND NECK TECHNIQUE: Multidetector CT imaging of the head and neck was performed using the standard protocol during bolus administration of intravenous contrast. Multiplanar CT image reconstructions and MIPs were obtained to evaluate the vascular anatomy. Carotid stenosis measurements (when applicable) are obtained utilizing NASCET criteria, using the distal internal carotid diameter as the denominator. CONTRAST:  136m OMNIPAQUE IOHEXOL 350 MG/ML SOLN COMPARISON:  08/15/2020 FINDINGS: CT HEAD FINDINGS Brain: There is no mass, hemorrhage or extra-axial collection. The size and configuration of the ventricles and extra-axial CSF spaces are normal. There is hypoattenuation of the periventricular white matter, most commonly indicating chronic ischemic microangiopathy. Skull: The visualized skull base, calvarium and extracranial soft tissues are normal. Sinuses/Orbits: No fluid levels or advanced mucosal thickening of the visualized paranasal sinuses. No mastoid or middle ear effusion. The orbits are normal. CTA NECK FINDINGS SKELETON: There is no bony spinal canal stenosis. No lytic or blastic lesion. OTHER NECK: Normal pharynx, larynx and major salivary  glands. No cervical lymphadenopathy. Unremarkable thyroid gland. UPPER CHEST: No pneumothorax or pleural effusion. No nodules or masses. AORTIC ARCH: There is calcific atherosclerosis of the aortic arch. There is no aneurysm, dissection or hemodynamically significant stenosis of the visualized portion of the aorta. Conventional 3 vessel aortic branching pattern. The visualized proximal subclavian arteries are widely patent. RIGHT CAROTID SYSTEM: Normal without aneurysm, dissection or stenosis. LEFT CAROTID SYSTEM: Atheromatous plaque at the left carotid bifurcation causing severe stenosis with angiographic string sign. The  distal ICA is patent. VERTEBRAL ARTERIES: Left dominant configuration. The right vertebral artery is occluded proximally with reconstitution at the C4 level. There is no dissection, occlusion or flow-limiting stenosis to the skull base (V1-V3 segments). CTA HEAD FINDINGS POSTERIOR CIRCULATION: --Vertebral arteries: No enhancement of the diminutive right V4 segment normal left. --Inferior cerebellar arteries: Normal. --Basilar artery: Normal. --Superior cerebellar arteries: Normal. --Posterior cerebral arteries (PCA): Normal. ANTERIOR CIRCULATION: --Intracranial internal carotid arteries: Normal. --Anterior cerebral arteries (ACA): Normal. Both A1 segments are present. Patent anterior communicating artery (a-comm). --Middle cerebral arteries (MCA): Normal. VENOUS SINUSES: As permitted by contrast timing, patent. ANATOMIC VARIANTS: Fetal origins of both posterior cerebral arteries. Review of the MIP images confirms the above findings. IMPRESSION: 1. No intracranial arterial occlusion or high-grade stenosis. 2. Severe stenosis of the proximal left internal carotid artery with angiographic string sign. 3. 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 Aortic Atherosclerosis (ICD10-I70.0). Electronically Signed   By: Ulyses Jarred M.D.   On: 08/18/2020 01:50

## 2020-08-19 NOTE — Plan of Care (Signed)
  Problem: Education: Goal: Knowledge of disease or condition will improve Outcome: Progressing Goal: Knowledge of secondary prevention will improve Outcome: Progressing Goal: Knowledge of patient specific risk factors addressed and post discharge goals established will improve Outcome: Progressing Goal: Individualized Educational Video(s) Outcome: Progressing   Problem: Coping: Goal: Will verbalize positive feelings about self Outcome: Progressing Goal: Will identify appropriate support needs Outcome: Progressing   Problem: Health Behavior/Discharge Planning: Goal: Ability to manage health-related needs will improve Outcome: Progressing   Problem: Self-Care: Goal: Ability to participate in self-care as condition permits will improve Outcome: Progressing Goal: Verbalization of feelings and concerns over difficulty with self-care will improve Outcome: Progressing Goal: Ability to communicate needs accurately will improve Outcome: Progressing   Problem: Nutrition: Goal: Risk of aspiration will decrease Outcome: Progressing Goal: Dietary intake will improve Outcome: Progressing   Problem: Intracerebral Hemorrhage Tissue Perfusion: Goal: Complications of Intracerebral Hemorrhage will be minimized Outcome: Progressing   Problem: Ischemic Stroke/TIA Tissue Perfusion: Goal: Complications of ischemic stroke/TIA will be minimized Outcome: Progressing   Problem: Spontaneous Subarachnoid Hemorrhage Tissue Perfusion: Goal: Complications of Spontaneous Subarachnoid Hemorrhage will be minimized Outcome: Progressing   Problem: Education: Goal: Knowledge of General Education information will improve Description: Including pain rating scale, medication(s)/side effects and non-pharmacologic comfort measures Outcome: Progressing   Problem: Health Behavior/Discharge Planning: Goal: Ability to manage health-related needs will improve Outcome: Progressing   Problem: Clinical  Measurements: Goal: Ability to maintain clinical measurements within normal limits will improve Outcome: Progressing Goal: Will remain free from infection Outcome: Progressing Goal: Diagnostic test results will improve Outcome: Progressing Goal: Respiratory complications will improve Outcome: Progressing Goal: Cardiovascular complication will be avoided Outcome: Progressing   Problem: Activity: Goal: Risk for activity intolerance will decrease Outcome: Progressing   Problem: Nutrition: Goal: Adequate nutrition will be maintained Outcome: Progressing   Problem: Coping: Goal: Level of anxiety will decrease Outcome: Progressing   Problem: Elimination: Goal: Will not experience complications related to bowel motility Outcome: Progressing Goal: Will not experience complications related to urinary retention Outcome: Progressing   Problem: Pain Managment: Goal: General experience of comfort will improve Outcome: Progressing   Problem: Safety: Goal: Ability to remain free from injury will improve Outcome: Progressing   Problem: Skin Integrity: Goal: Risk for impaired skin integrity will decrease Outcome: Progressing

## 2020-08-20 ENCOUNTER — Encounter (HOSPITAL_COMMUNITY)
Admission: EM | Disposition: A | Discharge status: Home / Self Care | Payer: Self-pay | Source: Home / Self Care | Attending: Internal Medicine

## 2020-08-20 ENCOUNTER — Inpatient Hospital Stay (HOSPITAL_COMMUNITY): Payer: Medicare Other | Admitting: Anesthesiology

## 2020-08-20 ENCOUNTER — Encounter (HOSPITAL_COMMUNITY): Payer: Self-pay | Admitting: Internal Medicine

## 2020-08-20 DIAGNOSIS — F319 Bipolar disorder, unspecified: Secondary | ICD-10-CM | POA: Diagnosis not present

## 2020-08-20 DIAGNOSIS — I6522 Occlusion and stenosis of left carotid artery: Secondary | ICD-10-CM

## 2020-08-20 DIAGNOSIS — I10 Essential (primary) hypertension: Secondary | ICD-10-CM | POA: Diagnosis not present

## 2020-08-20 DIAGNOSIS — I639 Cerebral infarction, unspecified: Secondary | ICD-10-CM | POA: Diagnosis not present

## 2020-08-20 DIAGNOSIS — E782 Mixed hyperlipidemia: Secondary | ICD-10-CM | POA: Diagnosis not present

## 2020-08-20 HISTORY — PX: ENDARTERECTOMY: SHX5162

## 2020-08-20 HISTORY — PX: PATCH ANGIOPLASTY: SHX6230

## 2020-08-20 LAB — COMPREHENSIVE METABOLIC PANEL
ALT: 16 U/L (ref 0–44)
AST: 15 U/L (ref 15–41)
Albumin: 2.9 g/dL — ABNORMAL LOW (ref 3.5–5.0)
Alkaline Phosphatase: 63 U/L (ref 38–126)
Anion gap: 8 (ref 5–15)
BUN: 22 mg/dL — ABNORMAL HIGH (ref 6–20)
CO2: 27 mmol/L (ref 22–32)
Calcium: 8.7 mg/dL — ABNORMAL LOW (ref 8.9–10.3)
Chloride: 103 mmol/L (ref 98–111)
Creatinine, Ser: 1.22 mg/dL — ABNORMAL HIGH (ref 0.44–1.00)
GFR, Estimated: 52 mL/min — ABNORMAL LOW (ref >60)
Glucose, Bld: 100 mg/dL — ABNORMAL HIGH (ref 70–99)
Potassium: 4.1 mmol/L (ref 3.5–5.1)
Sodium: 138 mmol/L (ref 135–145)
Total Bilirubin: 0.6 mg/dL (ref 0.3–1.2)
Total Protein: 5.7 g/dL — ABNORMAL LOW (ref 6.5–8.1)

## 2020-08-20 LAB — CBC WITH DIFFERENTIAL/PLATELET
Abs Immature Granulocytes: 0 10*3/uL (ref 0.00–0.07)
Basophils Absolute: 0 10*3/uL (ref 0.0–0.1)
Basophils Relative: 0 %
Eosinophils Absolute: 0.3 10*3/uL (ref 0.0–0.5)
Eosinophils Relative: 2 %
HCT: 41.6 % (ref 36.0–46.0)
Hemoglobin: 13.5 g/dL (ref 12.0–15.0)
Lymphocytes Relative: 46 %
Lymphs Abs: 6.4 10*3/uL — ABNORMAL HIGH (ref 0.7–4.0)
MCH: 27.8 pg (ref 26.0–34.0)
MCHC: 32.5 g/dL (ref 30.0–36.0)
MCV: 85.8 fL (ref 80.0–100.0)
Monocytes Absolute: 0.7 10*3/uL (ref 0.1–1.0)
Monocytes Relative: 5 %
Neutro Abs: 6.6 10*3/uL (ref 1.7–7.7)
Neutrophils Relative %: 47 %
Platelets: 295 10*3/uL (ref 150–400)
RBC: 4.85 MIL/uL (ref 3.87–5.11)
RDW: 14.8 % (ref 11.5–15.5)
WBC: 14 10*3/uL — ABNORMAL HIGH (ref 4.0–10.5)
nRBC: 0 % (ref 0.0–0.2)
nRBC: 0 /100 WBC

## 2020-08-20 LAB — SURGICAL PCR SCREEN
MRSA, PCR: NEGATIVE
Staphylococcus aureus: NEGATIVE

## 2020-08-20 LAB — MAGNESIUM: Magnesium: 1.9 mg/dL (ref 1.7–2.4)

## 2020-08-20 LAB — ABO/RH: ABO/RH(D): O POS

## 2020-08-20 SURGERY — ENDARTERECTOMY, CAROTID
Anesthesia: General | Site: Neck | Laterality: Left

## 2020-08-20 MED ORDER — LACTATED RINGERS IV SOLN: INTRAVENOUS | Status: DC | PRN

## 2020-08-20 MED ORDER — MEPERIDINE HCL 25 MG/ML IJ SOLN: 6.2500 mg | INTRAMUSCULAR | Status: DC | PRN

## 2020-08-20 MED ORDER — ORAL CARE MOUTH RINSE: 15.0000 mL | Freq: Once | OROMUCOSAL | Status: AC

## 2020-08-20 MED ORDER — HEPARIN SODIUM (PORCINE) 1000 UNIT/ML IJ SOLN
INTRAMUSCULAR | Status: DC | PRN
  Administered 2020-08-20: 7000 [IU] via INTRAVENOUS

## 2020-08-20 MED ORDER — SODIUM CHLORIDE 0.9 % IV SOLN: 500.0000 mL | Freq: Once | INTRAVENOUS | Status: DC | PRN

## 2020-08-20 MED ORDER — GUAIFENESIN-DM 100-10 MG/5ML PO SYRP
15.0000 mL | ORAL_SOLUTION | ORAL | Status: DC | PRN
Start: 2020-08-20 — End: 2020-08-21

## 2020-08-20 MED ORDER — CEFAZOLIN SODIUM-DEXTROSE 2-4 GM/100ML-% IV SOLN
INTRAVENOUS | Status: AC
  Filled 2020-08-20: qty 100

## 2020-08-20 MED ORDER — PROMETHAZINE HCL 25 MG/ML IJ SOLN: 6.2500 mg | INTRAMUSCULAR | Status: DC | PRN

## 2020-08-20 MED ORDER — FENTANYL CITRATE (PF) 250 MCG/5ML IJ SOLN
INTRAMUSCULAR | Status: AC
  Filled 2020-08-20: qty 5

## 2020-08-20 MED ORDER — HYDROMORPHONE HCL 1 MG/ML IJ SOLN
0.2500 mg | INTRAMUSCULAR | Status: DC | PRN
  Administered 2020-08-20 (×2): 0.5 mg via INTRAVENOUS

## 2020-08-20 MED ORDER — HYDRALAZINE HCL 20 MG/ML IJ SOLN
5.0000 mg | INTRAMUSCULAR | Status: DC | PRN
Start: 2020-08-20 — End: 2020-08-21

## 2020-08-20 MED ORDER — PANTOPRAZOLE SODIUM 40 MG PO TBEC
40.0000 mg | DELAYED_RELEASE_TABLET | Freq: Every day | ORAL | Status: DC
  Administered 2020-08-21: 40 mg via ORAL
  Filled 2020-08-20 (×2): qty 1

## 2020-08-20 MED ORDER — SODIUM CHLORIDE 0.9 % IV SOLN: INTRAVENOUS | Status: DC

## 2020-08-20 MED ORDER — ONDANSETRON HCL 4 MG/2ML IJ SOLN
INTRAMUSCULAR | Status: DC | PRN
  Administered 2020-08-20: 4 mg via INTRAVENOUS

## 2020-08-20 MED ORDER — HEPARIN 6000 UNIT IRRIGATION SOLUTION
Status: DC | PRN
  Administered 2020-08-20: 1

## 2020-08-20 MED ORDER — HEPARIN 6000 UNIT IRRIGATION SOLUTION
Status: AC
  Filled 2020-08-20: qty 500

## 2020-08-20 MED ORDER — LACTATED RINGERS IV SOLN: INTRAVENOUS | Status: DC

## 2020-08-20 MED ORDER — MIDAZOLAM HCL 2 MG/2ML IJ SOLN
INTRAMUSCULAR | Status: AC
  Filled 2020-08-20: qty 2

## 2020-08-20 MED ORDER — CEFAZOLIN SODIUM-DEXTROSE 2-3 GM-%(50ML) IV SOLR
INTRAVENOUS | Status: DC | PRN
  Administered 2020-08-20: 2 g via INTRAVENOUS

## 2020-08-20 MED ORDER — CHLORHEXIDINE GLUCONATE 0.12 % MT SOLN
OROMUCOSAL | Status: AC
  Filled 2020-08-20: qty 15

## 2020-08-20 MED ORDER — LABETALOL HCL 5 MG/ML IV SOLN
10.0000 mg | INTRAVENOUS | Status: DC | PRN
Start: 2020-08-20 — End: 2020-08-21

## 2020-08-20 MED ORDER — MAGNESIUM SULFATE 2 GM/50ML IV SOLN
2.0000 g | Freq: Every day | INTRAVENOUS | Status: DC | PRN
Start: 2020-08-20 — End: 2020-08-21

## 2020-08-20 MED ORDER — HEPARIN SODIUM (PORCINE) 5000 UNIT/ML IJ SOLN
5000.0000 [IU] | Freq: Three times a day (TID) | INTRAMUSCULAR | Status: DC
  Administered 2020-08-21: 5000 [IU] via SUBCUTANEOUS
  Filled 2020-08-20: qty 1

## 2020-08-20 MED ORDER — HYDROMORPHONE HCL 1 MG/ML IJ SOLN: 0.2500 mg | INTRAMUSCULAR | Status: DC

## 2020-08-20 MED ORDER — PHENYLEPHRINE HCL-NACL 20-0.9 MG/250ML-% IV SOLN
INTRAVENOUS | Status: DC | PRN
Start: 2020-08-20 — End: 2020-08-20
  Administered 2020-08-20: 50 ug/min via INTRAVENOUS

## 2020-08-20 MED ORDER — PHENOL 1.4 % MT LIQD: 1.0000 | OROMUCOSAL | Status: DC | PRN

## 2020-08-20 MED ORDER — MIDAZOLAM HCL 2 MG/2ML IJ SOLN
INTRAMUSCULAR | Status: DC | PRN
  Administered 2020-08-20: 2 mg via INTRAVENOUS

## 2020-08-20 MED ORDER — HYDROMORPHONE HCL 1 MG/ML IJ SOLN
INTRAMUSCULAR | Status: AC
  Administered 2020-08-20: 0.5 mg via INTRAVENOUS
  Filled 2020-08-20: qty 1

## 2020-08-20 MED ORDER — FENTANYL CITRATE (PF) 100 MCG/2ML IJ SOLN
INTRAMUSCULAR | Status: AC
  Administered 2020-08-20: 50 ug via INTRAVENOUS
  Filled 2020-08-20: qty 2

## 2020-08-20 MED ORDER — HEMOSTATIC AGENTS (NO CHARGE) OPTIME
TOPICAL | Status: DC | PRN
  Administered 2020-08-20: 1 via TOPICAL

## 2020-08-20 MED ORDER — SODIUM CHLORIDE 0.9 % IV SOLN
0.0125 ug/kg/min | INTRAVENOUS | Status: DC
  Filled 2020-08-20: qty 2000

## 2020-08-20 MED ORDER — FENTANYL CITRATE (PF) 250 MCG/5ML IJ SOLN
INTRAMUSCULAR | Status: DC | PRN
  Administered 2020-08-20: 100 ug via INTRAVENOUS

## 2020-08-20 MED ORDER — CHLORHEXIDINE GLUCONATE 0.12 % MT SOLN
15.0000 mL | Freq: Once | OROMUCOSAL | Status: AC
  Administered 2020-08-20: 15 mL via OROMUCOSAL

## 2020-08-20 MED ORDER — LIDOCAINE HCL (CARDIAC) PF 100 MG/5ML IV SOSY
PREFILLED_SYRINGE | INTRAVENOUS | Status: DC | PRN
  Administered 2020-08-20: 60 mg via INTRATRACHEAL

## 2020-08-20 MED ORDER — CEFAZOLIN SODIUM-DEXTROSE 2-4 GM/100ML-% IV SOLN
2.0000 g | Freq: Three times a day (TID) | INTRAVENOUS | Status: AC
  Administered 2020-08-20 – 2020-08-21 (×2): 2 g via INTRAVENOUS
  Filled 2020-08-20 (×2): qty 100

## 2020-08-20 MED ORDER — SODIUM CHLORIDE 0.9 % IV SOLN
INTRAVENOUS | Status: DC | PRN
  Administered 2020-08-20: .05 ug/kg/min via INTRAVENOUS

## 2020-08-20 MED ORDER — ROCURONIUM BROMIDE 100 MG/10ML IV SOLN
INTRAVENOUS | Status: DC | PRN
  Administered 2020-08-20: 20 mg via INTRAVENOUS
  Administered 2020-08-20: 50 mg via INTRAVENOUS
  Administered 2020-08-20: 20 mg via INTRAVENOUS

## 2020-08-20 MED ORDER — LIDOCAINE HCL 1 % IJ SOLN
INTRAMUSCULAR | Status: AC
  Filled 2020-08-20: qty 20

## 2020-08-20 MED ORDER — PROTAMINE SULFATE 10 MG/ML IV SOLN
INTRAVENOUS | Status: DC | PRN
  Administered 2020-08-20 (×5): 10 mg via INTRAVENOUS

## 2020-08-20 MED ORDER — PHENYLEPHRINE HCL (PRESSORS) 10 MG/ML IV SOLN
INTRAVENOUS | Status: DC | PRN
  Administered 2020-08-20: 40 ug via INTRAVENOUS
  Administered 2020-08-20: 80 ug via INTRAVENOUS
  Administered 2020-08-20: 40 ug via INTRAVENOUS
  Administered 2020-08-20: 80 ug via INTRAVENOUS

## 2020-08-20 MED ORDER — METOPROLOL TARTRATE 5 MG/5ML IV SOLN: 2.0000 mg | INTRAVENOUS | Status: DC | PRN

## 2020-08-20 MED ORDER — ONDANSETRON HCL 4 MG/2ML IJ SOLN
4.0000 mg | Freq: Four times a day (QID) | INTRAMUSCULAR | Status: DC | PRN
  Administered 2020-08-21: 4 mg via INTRAVENOUS
  Filled 2020-08-20: qty 2

## 2020-08-20 MED ORDER — DOCUSATE SODIUM 100 MG PO CAPS
100.0000 mg | ORAL_CAPSULE | Freq: Every day | ORAL | Status: DC
  Administered 2020-08-21: 100 mg via ORAL
  Filled 2020-08-20: qty 1

## 2020-08-20 MED ORDER — PROPOFOL 10 MG/ML IV BOLUS
INTRAVENOUS | Status: DC | PRN
  Administered 2020-08-20: 120 mg via INTRAVENOUS
  Administered 2020-08-20: 40 mg via INTRAVENOUS

## 2020-08-20 MED ORDER — 0.9 % SODIUM CHLORIDE (POUR BTL) OPTIME
TOPICAL | Status: DC | PRN
  Administered 2020-08-20: 3000 mL

## 2020-08-20 MED ORDER — HYDROMORPHONE HCL 1 MG/ML IJ SOLN
0.5000 mg | INTRAMUSCULAR | Status: DC | PRN
  Administered 2020-08-20 – 2020-08-21 (×3): 0.5 mg via INTRAVENOUS
  Filled 2020-08-20 (×3): qty 1

## 2020-08-20 MED ORDER — PROPOFOL 10 MG/ML IV BOLUS
INTRAVENOUS | Status: AC
  Filled 2020-08-20: qty 40

## 2020-08-20 MED ORDER — FENTANYL CITRATE (PF) 100 MCG/2ML IJ SOLN
25.0000 ug | INTRAMUSCULAR | Status: DC | PRN
  Administered 2020-08-20: 50 ug via INTRAVENOUS

## 2020-08-20 MED ORDER — DEXAMETHASONE SODIUM PHOSPHATE 10 MG/ML IJ SOLN
INTRAMUSCULAR | Status: DC | PRN
  Administered 2020-08-20: 10 mg via INTRAVENOUS

## 2020-08-20 MED ORDER — SUGAMMADEX SODIUM 200 MG/2ML IV SOLN
INTRAVENOUS | Status: DC | PRN
  Administered 2020-08-20: 300 mg via INTRAVENOUS

## 2020-08-20 MED ORDER — POTASSIUM CHLORIDE CRYS ER 20 MEQ PO TBCR: 20.0000 meq | EXTENDED_RELEASE_TABLET | Freq: Every day | ORAL | Status: DC | PRN

## 2020-08-20 MED ORDER — OXYCODONE HCL 5 MG PO TABS
5.0000 mg | ORAL_TABLET | Freq: Four times a day (QID) | ORAL | Status: DC | PRN
Start: 2020-08-20 — End: 2020-08-21
  Administered 2020-08-20 – 2020-08-21 (×3): 5 mg via ORAL
  Filled 2020-08-20 (×3): qty 1

## 2020-08-20 SURGICAL SUPPLY — 35 items
ADH SKN CLS APL DERMABOND .7 (GAUZE/BANDAGES/DRESSINGS) ×1
BLADE MINI RND TIP GREEN BEAV (BLADE) ×3 IMPLANT
CANISTER SUCT 3000ML PPV (MISCELLANEOUS) ×2 IMPLANT
CANNULA VESSEL 3MM 2 BLNT TIP (CANNULA) ×4 IMPLANT
CATH ROBINSON RED A/P 18FR (CATHETERS) ×2 IMPLANT
CLIP VESOCCLUDE MED 24/CT (CLIP) ×2 IMPLANT
CLIP VESOCCLUDE SM WIDE 24/CT (CLIP) ×2 IMPLANT
COVER PROBE W GEL 5X96 (DRAPES) ×2 IMPLANT
DERMABOND ADVANCED (GAUZE/BANDAGES/DRESSINGS) ×1
DERMABOND ADVANCED .7 DNX12 (GAUZE/BANDAGES/DRESSINGS) IMPLANT
ELECT REM PT RETURN 9FT ADLT (ELECTROSURGICAL) ×2
ELECTRODE REM PT RTRN 9FT ADLT (ELECTROSURGICAL) ×1 IMPLANT
GLOVE SURG NEOP MICRO LF SZ6.5 (GLOVE) ×3 IMPLANT
GLOVE SURG POLYISO LF SZ8 (GLOVE) ×2 IMPLANT
GOWN STRL REUS W/ TWL LRG LVL3 (GOWN DISPOSABLE) ×2 IMPLANT
GOWN STRL REUS W/ TWL XL LVL3 (GOWN DISPOSABLE) ×1 IMPLANT
GOWN STRL REUS W/TWL LRG LVL3 (GOWN DISPOSABLE) ×6
GOWN STRL REUS W/TWL XL LVL3 (GOWN DISPOSABLE) ×2
HEMOSTAT SNOW SURGICEL 2X4 (HEMOSTASIS) ×1 IMPLANT
KIT BASIN OR (CUSTOM PROCEDURE TRAY) ×2 IMPLANT
KIT SHUNT ARGYLE CAROTID ART 6 (VASCULAR PRODUCTS) ×2 IMPLANT
KIT TURNOVER KIT B (KITS) ×2 IMPLANT
NS IRRIG 1000ML POUR BTL (IV SOLUTION) ×6 IMPLANT
PACK CAROTID (CUSTOM PROCEDURE TRAY) ×2 IMPLANT
PAD ARMBOARD 7.5X6 YLW CONV (MISCELLANEOUS) ×4 IMPLANT
PATCH VASC XENOSURE 1CMX6CM (Vascular Products) ×2 IMPLANT
PATCH VASC XENOSURE 1X6 (Vascular Products) IMPLANT
PENCIL SMOKE EVACUATOR (MISCELLANEOUS) ×2 IMPLANT
POSITIONER HEAD DONUT 9IN (MISCELLANEOUS) ×2 IMPLANT
SUT MNCRL AB 4-0 PS2 18 (SUTURE) ×2 IMPLANT
SUT PROLENE 6 0 BV (SUTURE) ×5 IMPLANT
SUT VIC AB 3-0 SH 27 (SUTURE) ×2
SUT VIC AB 3-0 SH 27X BRD (SUTURE) ×1 IMPLANT
TOWEL GREEN STERILE (TOWEL DISPOSABLE) ×2 IMPLANT
WATER STERILE IRR 1000ML POUR (IV SOLUTION) ×2 IMPLANT

## 2020-08-20 NOTE — Progress Notes (Signed)
Patient transported to OR for the procedure via bed at this time. Patient hemodynamically stable during transport.

## 2020-08-20 NOTE — Progress Notes (Signed)
[facility] [place] PROGRESS NOTE  ASSESSMENT / PLAN: [name] is a 55 y.o. female with symptomatic left carotid artery stenosis. Plan left carotid endarterectomy today in OR.  SUBJECTIVE: No interval changes. Discussed operative plan  OBJECTIVE: BP 128/63   Pulse 70   Temp 98.1 F (36.7 C) (Oral)   Resp 18   Ht '5\' 9"'$  (1.753 m)   Wt 70 kg   SpO2 98%   BMI 22.79 kg/m  No intake or output data in the 24 hours ending 08/20/20 1059   No distress.  Neurologic exam unchanged.  CBC Latest Ref Rng & Units 08/20/2020 08/18/2020 08/17/2020  WBC 4.0 - 10.5 K/uL 14.0(H) 15.0(H) 17.7(H)  Hemoglobin 12.0 - 15.0 g/dL 13.5 14.1 14.8  Hematocrit 36.0 - 46.0 % 41.6 43.9 46.3(H)  Platelets 150 - 400 K/uL 295 324 318     CMP Latest Ref Rng & Units 08/20/2020 08/17/2020 08/16/2020  Glucose 70 - 99 mg/dL 100(H) 116(H) 86  BUN 6 - 20 mg/dL 22(H) 26(H) 22(H)  Creatinine 0.44 - 1.00 mg/dL 1.22(H) 0.96 0.99  Sodium 135 - 145 mmol/L 138 137 137  Potassium 3.5 - 5.1 mmol/L 4.1 3.8 4.1  Chloride 98 - 111 mmol/L 103 102 104  CO2 22 - 32 mmol/L '27 29 26  '$ Calcium 8.9 - 10.3 mg/dL 8.7(L) 8.9 9.1  Total Protein 6.5 - 8.1 g/dL 5.7(L) - 6.8  Total Bilirubin 0.3 - 1.2 mg/dL 0.6 - 0.7  Alkaline Phos 38 - 126 U/L 63 - 62  AST 15 - 41 U/L 15 - 14(L)  ALT 0 - 44 U/L 16 - 17    Estimated Creatinine Clearance: 53.8 mL/min (A) (by C-G formula based on SCr of 1.22 mg/dL (H)).  CTA personally reviewed - anatomy appears favorable for CEA.  [name]. Stanford Breed, MD [facility] Phone Number: [phone] 08/20/2020 10:59 AM

## 2020-08-20 NOTE — Anesthesia Procedure Notes (Signed)
Procedure Name: Intubation Date/Time: 08/20/2020 11:21 AM Performed by: Eligha Bridegroom, CRNA Pre-anesthesia Checklist: Patient identified, Emergency Drugs available, Patient being monitored and Timeout performed Patient Re-evaluated:Patient Re-evaluated prior to induction Oxygen Delivery Method: Circle system utilized Preoxygenation: Pre-oxygenation with 100% oxygen Ventilation: Oral airway inserted - appropriate to patient size and Mask ventilation without difficulty Laryngoscope Size: Mac and 4 Grade View: Grade II Tube type: Oral Tube size: 7.0 mm Number of attempts: 1 Airway Equipment and Method: Stylet Placement Confirmation: ETT inserted through vocal cords under direct vision, breath sounds checked- equal and bilateral and positive ETCO2 Secured at: 22 cm Tube secured with: Tape Dental Injury: Teeth and Oropharynx as per pre-operative assessment

## 2020-08-20 NOTE — Transfer of Care (Signed)
Immediate Anesthesia Transfer of Care Note  Patient: [name]  Procedure(s) Performed: LEFT CAROTID ARTERY ENDARTERECTOMY (Left: Neck) PATCH ANGIOPLASTY WITH 1x6 XENOSURE PATCH (Left: Neck)  Patient Location: PACU  Anesthesia Type:General  Level of Consciousness: awake, alert  and oriented  Airway & Oxygen Therapy: Patient Spontanous Breathing and Patient connected to nasal cannula oxygen  Post-op Assessment: Report given to RN and Post -op Vital signs reviewed and stable  Post vital signs: Reviewed and stable  Last Vitals:  Vitals Value Taken Time  BP 149/79   Temp    Pulse 81 08/20/20 1355  Resp 24 08/20/20 1355  SpO2 100 % 08/20/20 1355  Vitals shown include unvalidated device data.  Last Pain:  Vitals:   08/20/20 1030  TempSrc:   PainSc: 0-No pain      Patients Stated Pain Goal: 0 ([id number])  Complications: No notable events documented.

## 2020-08-20 NOTE — Progress Notes (Addendum)
PROGRESS NOTE  [name] [id number] DOB: 06-13-1965 DOA: 08/15/2020 PCP: Delora Fuel, MD   LOS: 2 days   Brief narrative:  Patient is a 55 years old female with history of hyperlipidemia, hypertension, bipolar disorder and tobacco abuse presented to hospital with sudden onset of word finding difficulty on 08/15/2020.  Work-up revealed a left MCA infarct with a left-sided carotid artery stenosis and patient was admitted hospital for further stroke work-up and treatment.  Plan for carotid endarterectomy 08/20/20  Assessment/Plan:  Principal Problem:   Acute CVA (cerebrovascular accident) [facility]) Active Problems:   Kidney donor   Bipolar 1 disorder ([place])   Tobacco abuse   HTN (hypertension)   Thoracic back pain   Leukocytosis   Hypokalemia   Hyperlipidemia   Transient ischemic attack   Acute left MCA ischemic infarct along with critical left ICA stenosis -  On dual antiplatelets.  Neurology and vascular surgery on board.  Planning for surgical intervention with CEA on 08/20/2020.  Latest hemoglobin A1c of 6.1.  LDL was 81.  2D echocardiogram with preserved LV function.  Neurology has signed off at this time.  Recommend aspirin and Plavix and Lipitor 80.  Left ICA critical stenosis.   Continue dual antiplatelets and high intensity statins.  Vascular surgery for surgical intervention today.    Essential hypertension.  Initially was on permissive hypertension.  Will resume after surgical intervention.  Dyslipidemia.  Lipitor dose was increased to 80 mg.   Chronic low back pain.  Supportive care.   Ongoing smoking.  Continue nicotine patch   History of bipolar disorder.  On Lamictal.  Continue  DVT prophylaxis: heparin injection 5,000 Units Start: 08/18/20 1400 SCDs Start: 08/16/20 0754   Code Status:  Full  code  Family Communication: None  Status is: Inpatient  Remains inpatient appropriate because:Ongoing diagnostic testing needed not appropriate for outpatient  work up, IV treatments appropriate due to intensity of illness or inability to take PO, and Inpatient level of care appropriate due to severity of illness  Dispo: The patient is from: Home              Anticipated d/c is to: Home.  Patient was seen by physical therapy who recommended no acute PT needs              patient currently is not medically stable to d/c.   Difficult to place patient No  Consultants: Vascular surgery Neurology  Procedures: None so far.  Plan for carotid endarterectomy 08/20/2020   anti-infectives: none  Anti-infectives (From admission, onward)    None       Subjective: Today, patient was seen and examined at bedside.  Getting ready for surgical intervention.  Denies any interval symptoms.  Objective: Vitals:   08/20/20 0540 08/20/20 0726  BP: 127/75 (!) 130/106  Pulse: 89 86  Resp: 17 18  Temp: 98.1 F (36.7 C)   SpO2: 96% 96%   No intake or output data in the 24 hours ending 08/20/20 0943 Filed Weights   08/15/20 1543  Weight: 70 kg   Body mass index is 22.79 kg/m.   Physical Exam: GENERAL: Patient is alert awake and oriented. Not in obvious distress. HENT: No scleral pallor or icterus. Pupils equally reactive to light. Oral mucosa is moist NECK: is supple, no gross swelling noted. CHEST: Clear to auscultation. No crackles or wheezes.  Diminished breath sounds bilaterally. CVS: S1 and S2 heard, no murmur. Regular rate and rhythm.  ABDOMEN: Soft, non-tender, bowel sounds  are present. EXTREMITIES: No edema. CNS: No focal weakness.  Speech distinct SKIN: warm and dry without rashes.  Data Review: I have personally reviewed the following laboratory data and studies,  CBC: Recent Labs  Lab 08/15/20 1637 08/16/20 0754 08/17/20 0510 08/18/20 0428 08/20/20 0422  WBC 16.4* 13.0* 17.7* 15.0* 14.0*  NEUTROABS 5.6  --   --   --  6.6  HGB 14.3 14.3 14.8 14.1 13.5  HCT 44.1 45.1 46.3* 43.9 41.6  MCV 86.3 86.4 85.7 85.1 85.8  PLT 317  308 318 324 [id number]   Basic Metabolic Panel: Recent Labs  Lab 08/15/20 1637 08/16/20 0754 08/17/20 0510 08/20/20 0422  NA 138 137 137 138  K 3.4* 4.1 3.8 4.1  CL 106 104 102 103  CO2 '26 26 29 27  '$ GLUCOSE 106* 86 116* 100*  BUN 21* 22* 26* 22*  CREATININE 1.16* 0.99 0.96 1.22*  CALCIUM 8.6* 9.1 8.9 8.7*  MG  --  2.1  --  1.9  PHOS  --  4.5  --   --    Liver Function Tests: Recent Labs  Lab 08/15/20 1637 08/16/20 0754 08/20/20 0422  AST 13* 14* 15  ALT '16 17 16  '$ ALKPHOS 64 62 63  BILITOT 0.6 0.7 0.6  PROT 6.6 6.8 5.7*  ALBUMIN 3.6 3.6 2.9*   No results for input(s): LIPASE, AMYLASE in the last 168 hours. No results for input(s): AMMONIA in the last 168 hours. Cardiac Enzymes: No results for input(s): CKTOTAL, CKMB, CKMBINDEX, TROPONINI in the last 168 hours. BNP (last 3 results) No results for input(s): BNP in the last 8760 hours.  ProBNP (last 3 results) No results for input(s): PROBNP in the last 8760 hours.  CBG: No results for input(s): GLUCAP in the last 168 hours. Recent Results (from the past 240 hour(s))  Resp Panel by RT-PCR (Flu A&B, Covid) Nasopharyngeal Swab     Status: None   Collection Time: 08/15/20  8:06 PM   Specimen: Nasopharyngeal Swab; Nasopharyngeal(NP) swabs in vial transport medium  Result Value Ref Range Status   SARS Coronavirus 2 by RT PCR NEGATIVE NEGATIVE Final    Comment: (NOTE) SARS-CoV-2 target nucleic acids are NOT DETECTED.  The SARS-CoV-2 RNA is generally detectable in upper respiratory specimens during the acute phase of infection. The lowest concentration of SARS-CoV-2 viral copies this assay can detect is 138 copies/mL. A negative result does not preclude SARS-Cov-2 infection and should not be used as the sole basis for treatment or other patient management decisions. A negative result may occur with  improper specimen collection/handling, submission of specimen other than nasopharyngeal swab, presence of viral mutation(s)  within the areas targeted by this assay, and inadequate number of viral copies(<138 copies/mL). A negative result must be combined with clinical observations, patient history, and epidemiological information. The expected result is Negative.  Fact Sheet for Patients:  EntrepreneurPulse.com.au  Fact Sheet for Healthcare Providers:  IncredibleEmployment.be  This test is no t yet approved or cleared by the [place] FDA and  has been authorized for detection and/or diagnosis of SARS-CoV-2 by FDA under an Emergency Use Authorization (EUA). This EUA will remain  in effect (meaning this test can be used) for the duration of the COVID-19 declaration under Section 564(b)(1) of the Act, 21 U.S.C.section 360bbb-3(b)(1), unless the authorization is terminated  or revoked sooner.       Influenza A by PCR NEGATIVE NEGATIVE Final   Influenza B by PCR NEGATIVE NEGATIVE Final  Comment: (NOTE) The Xpert Xpress SARS-CoV-2/FLU/RSV plus assay is intended as an aid in the diagnosis of influenza from Nasopharyngeal swab specimens and should not be used as a sole basis for treatment. Nasal washings and aspirates are unacceptable for Xpert Xpress SARS-CoV-2/FLU/RSV testing.  Fact Sheet for Patients: EntrepreneurPulse.com.au  Fact Sheet for Healthcare Providers: IncredibleEmployment.be  This test is not yet approved or cleared by the [place] FDA and has been authorized for detection and/or diagnosis of SARS-CoV-2 by FDA under an Emergency Use Authorization (EUA). This EUA will remain in effect (meaning this test can be used) for the duration of the COVID-19 declaration under Section 564(b)(1) of the Act, 21 U.S.C. section 360bbb-3(b)(1), unless the authorization is terminated or revoked.  Performed at [facility], [place] [address] [place], [place] [address]      Studies: No results  found.    Flora Lipps, MD  [facility] 08/20/2020  If 7PM-7AM, please contact night-coverage

## 2020-08-20 NOTE — Anesthesia Procedure Notes (Signed)
Arterial Line Insertion Start/End8/10/2020 10:00 AM, 08/20/2020 10:15 AM Performed by: Nolon Nations, MD, anesthesiologist  Patient location: Pre-op. Preanesthetic checklist: patient identified, IV checked, site marked, risks and benefits discussed, surgical consent, monitors and equipment checked, pre-op evaluation, timeout performed and anesthesia consent Lidocaine 1% used for infiltration Right, radial was placed Catheter size: 20 G Hand hygiene performed , maximum sterile barriers used  and Seldinger technique used  Attempts: 3 (2 x by Elby Beck, CRNA, 1 x Naveyah Iacovelli with U/S) Procedure performed using ultrasound guided technique. Ultrasound Notes:anatomy identified, needle tip was noted to be adjacent to the nerve/plexus identified, no ultrasound evidence of intravascular and/or intraneural injection and image(s) printed for medical record Following insertion, dressing applied and Biopatch. Post procedure assessment: normal and unchanged

## 2020-08-20 NOTE — Discharge Instructions (Signed)
   [facility] [place]  Discharge Instructions   Carotid Surgery  Please refer to the following instructions for your post-procedure care. Your surgeon or physician assistant will discuss any changes with you.  Activity  You are encouraged to walk as much as you can. You can slowly return to normal activities but must avoid strenuous activity and heavy lifting until your doctor tell you it's okay. Avoid activities such as vacuuming or swinging a golf club. You can drive after one week if you are comfortable and you are no longer taking prescription pain medications. It is normal to feel tired for serval weeks after your surgery. It is also normal to have difficulty with sleep habits, eating, and bowel movements after surgery. These will go away with time.  Bathing/Showering  Shower daily after you go home. Do not soak in a bathtub, hot tub, or swim until the incision heals completely.  Incision Care  Shower every day. Clean your incision with mild soap and water. Pat the area dry with a clean towel. You do not need a bandage unless otherwise instructed. Do not apply any ointments or creams to your incision. You may have skin glue on your incision. Do not peel it off. It will come off on its own in about one week. Your incision may feel thickened and raised for several weeks after your surgery. This is normal and the skin will soften over time.   For Men Only: It's okay to shave around the incision but do not shave the incision itself for 2 weeks. It is common to have numbness under your chin that could last for several months.  Diet  Resume your normal diet. There are no special food restrictions following this procedure. A low fat/low cholesterol diet is recommended for all patients with vascular disease. In order to heal from your surgery, it is CRITICAL to get adequate nutrition. Your body requires vitamins, minerals, and protein. Vegetables are the best source of  vitamins and minerals. Vegetables also provide the perfect balance of protein. Processed food has little nutritional value, so try to avoid this.  Medications  Resume taking all of your medications unless your doctor or physician assistant tells you not to. If your incision is causing pain, you may take over-the- counter pain relievers such as acetaminophen (Tylenol). If you were prescribed a stronger pain medication, please be aware these medications can cause nausea and constipation. Prevent nausea by taking the medication with a snack or meal. Avoid constipation by drinking plenty of fluids and eating foods with a high amount of fiber, such as fruits, vegetables, and grains.   Do not take Tylenol if you are taking prescription pain medications.  Follow Up  Our office will schedule a follow up appointment 2-3 weeks following discharge.  Please call us immediately for any of the following conditions  . Increased pain, redness, drainage (pus) from your incision site. . Fever of 101 degrees or higher. . If you should develop stroke (slurred speech, difficulty swallowing, weakness on one side of your body, loss of vision) you should call 911 and go to the nearest emergency room. .  Reduce your risk of vascular disease:  . Stop smoking. If you would like help call [facility] at 1-800-QUIT-NOW (1-800-784-8669) or [place] at 336-586-4000. . Manage your cholesterol . Maintain a desired weight . Control your diabetes . Keep your blood pressure down .  If you have any questions, please call the office at [phone]. 

## 2020-08-20 NOTE — Anesthesia Postprocedure Evaluation (Signed)
Anesthesia Post Note  Patient: [name]  Procedure(s) Performed: LEFT CAROTID ARTERY ENDARTERECTOMY (Left: Neck) PATCH ANGIOPLASTY WITH 1x6 XENOSURE PATCH (Left: Neck)     Patient location during evaluation: PACU Anesthesia Type: General Level of consciousness: sedated and patient cooperative Pain management: pain level controlled Vital Signs Assessment: post-procedure vital signs reviewed and stable Respiratory status: spontaneous breathing Cardiovascular status: stable Anesthetic complications: no   No notable events documented.  Last Vitals:  Vitals:   08/20/20 1650 08/20/20 1719  BP:  91/74  Pulse: 86 85  Resp: 17 16  Temp:  36.7 C  SpO2: 96% 99%    Last Pain:  Vitals:   08/20/20 1719  TempSrc: Oral  PainSc:                  Nolon Nations

## 2020-08-20 NOTE — Progress Notes (Signed)
Pt arrived to 4e from PACU. Pt oriented to room and staff. CHG bath done. Vitals obtained. NIH completed. Pt denies needs.

## 2020-08-21 ENCOUNTER — Encounter (HOSPITAL_COMMUNITY): Payer: Self-pay | Admitting: Vascular Surgery

## 2020-08-21 DIAGNOSIS — I10 Essential (primary) hypertension: Secondary | ICD-10-CM | POA: Diagnosis not present

## 2020-08-21 DIAGNOSIS — I63239 Cerebral infarction due to unspecified occlusion or stenosis of unspecified carotid arteries: Secondary | ICD-10-CM | POA: Diagnosis not present

## 2020-08-21 DIAGNOSIS — I639 Cerebral infarction, unspecified: Secondary | ICD-10-CM | POA: Diagnosis not present

## 2020-08-21 DIAGNOSIS — F319 Bipolar disorder, unspecified: Secondary | ICD-10-CM | POA: Diagnosis not present

## 2020-08-21 LAB — TYPE AND SCREEN
ABO/RH(D): O POS
Antibody Screen: POSITIVE
Donor AG Type: NEGATIVE
Donor AG Type: NEGATIVE
PT AG Type: NEGATIVE
Unit division: 0
Unit division: 0

## 2020-08-21 LAB — CBC WITH DIFFERENTIAL/PLATELET
Abs Immature Granulocytes: 0.14 10*3/uL — ABNORMAL HIGH (ref 0.00–0.07)
Basophils Absolute: 0 10*3/uL (ref 0.0–0.1)
Basophils Relative: 0 %
Eosinophils Absolute: 0 10*3/uL (ref 0.0–0.5)
Eosinophils Relative: 0 %
HCT: 37.9 % (ref 36.0–46.0)
Hemoglobin: 12.2 g/dL (ref 12.0–15.0)
Immature Granulocytes: 1 %
Lymphocytes Relative: 16 %
Lymphs Abs: 3 10*3/uL (ref 0.7–4.0)
MCH: 27.7 pg (ref 26.0–34.0)
MCHC: 32.2 g/dL (ref 30.0–36.0)
MCV: 86.1 fL (ref 80.0–100.0)
Monocytes Absolute: 0.8 10*3/uL (ref 0.1–1.0)
Monocytes Relative: 4 %
Neutro Abs: 14.6 10*3/uL — ABNORMAL HIGH (ref 1.7–7.7)
Neutrophils Relative %: 79 %
Platelets: 276 10*3/uL (ref 150–400)
RBC: 4.4 MIL/uL (ref 3.87–5.11)
RDW: 14.6 % (ref 11.5–15.5)
WBC: 18.5 10*3/uL — ABNORMAL HIGH (ref 4.0–10.5)
nRBC: 0 % (ref 0.0–0.2)

## 2020-08-21 LAB — COMPREHENSIVE METABOLIC PANEL
ALT: 16 U/L (ref 0–44)
AST: 17 U/L (ref 15–41)
Albumin: 2.8 g/dL — ABNORMAL LOW (ref 3.5–5.0)
Alkaline Phosphatase: 56 U/L (ref 38–126)
Anion gap: 10 (ref 5–15)
BUN: 21 mg/dL — ABNORMAL HIGH (ref 6–20)
CO2: 23 mmol/L (ref 22–32)
Calcium: 8.7 mg/dL — ABNORMAL LOW (ref 8.9–10.3)
Chloride: 104 mmol/L (ref 98–111)
Creatinine, Ser: 1.09 mg/dL — ABNORMAL HIGH (ref 0.44–1.00)
GFR, Estimated: 60 mL/min — ABNORMAL LOW (ref >60)
Glucose, Bld: 113 mg/dL — ABNORMAL HIGH (ref 70–99)
Potassium: 4.3 mmol/L (ref 3.5–5.1)
Sodium: 137 mmol/L (ref 135–145)
Total Bilirubin: 0.6 mg/dL (ref 0.3–1.2)
Total Protein: 5.6 g/dL — ABNORMAL LOW (ref 6.5–8.1)

## 2020-08-21 LAB — BPAM RBC
Blood Product Expiration Date: 202208242359
Blood Product Expiration Date: 202208242359
Unit Type and Rh: 5100
Unit Type and Rh: 5100

## 2020-08-21 LAB — LIPID PANEL
Cholesterol: 149 mg/dL (ref 0–200)
HDL: 58 mg/dL (ref >40)
LDL Cholesterol: 70 mg/dL (ref 0–99)
Total CHOL/HDL Ratio: 2.6 RATIO
Triglycerides: 106 mg/dL (ref <150)
VLDL: 21 mg/dL (ref 0–40)

## 2020-08-21 LAB — POCT ACTIVATED CLOTTING TIME
Activated Clotting Time: 289 seconds
Activated Clotting Time: 242 seconds

## 2020-08-21 LAB — MAGNESIUM: Magnesium: 1.7 mg/dL (ref 1.7–2.4)

## 2020-08-21 MED ORDER — HYDROCODONE-ACETAMINOPHEN 5-325 MG PO TABS: 1.0000 | ORAL_TABLET | Freq: Three times a day (TID) | ORAL | 0 refills | Status: DC | PRN

## 2020-08-21 MED ORDER — CLOPIDOGREL BISULFATE 75 MG PO TABS: 75.0000 mg | ORAL_TABLET | Freq: Every day | ORAL | 0 refills | Status: DC

## 2020-08-21 NOTE — Discharge Summary (Signed)
Physician Discharge Summary  [name] [id number] DOB: 1965-09-22 DOA: 08/15/2020  PCP: Delora Fuel, MD  Admit date: 08/15/2020 Discharge date: 08/21/2020  Admitted From: Home  Discharge disposition: Home  Recommendations for Outpatient Follow-Up:   Follow up with  [facility] in 3 to 4 weeks, vascular surgery in 3 weeks.  Office to set up appointments. Check CBC, BMP, magnesium in the next visit   Discharge Diagnosis:   Principal Problem:   Acute CVA (cerebrovascular accident) ([place]) Active Problems:   Bipolar 1 disorder ([name])   Tobacco abuse   HTN (hypertension)   Thoracic back pain   Leukocytosis   Hypokalemia   Hyperlipidemia   Carotid stenosis, symptomatic, with infarction [facility])   Discharge Condition: Improved.  Diet recommendation: Low sodium, heart healthy.    Wound care: Wound care on the neck  Code status: Full.   History of Present Illness:   Patient is a 55 years old female with history of hyperlipidemia, hypertension, bipolar disorder and tobacco abuse presented to the hospital with sudden onset of word finding difficulty on 08/15/2020.  Work-up revealed a left MCA infarct with a left-sided carotid artery stenosis and patient was admitted hospital for further stroke work-up and treatment.   Hospital Course:   Following conditions were addressed during hospitalization as listed below,  Acute left MCA ischemic infarct along with critical left ICA stenosis -   Neurology and vascular surgery followed patient during hospitalization.  Patient underwent CEA on 08/20/2020 by vascular surgery..  Vascular surgery followed the patient postoperatively and recommend outpatient follow-up in 3 weeks.  Latest hemoglobin A1c of 6.1.  LDL was 81.  2D echocardiogram with preserved LV function.  Neurology recommend aspirin and Plavix and Lipitor 80 milligrams on discharge.  Patient will however continue aspirin alone after 3 weeks.   Left ICA  critical stenosis, status post left carotid endarterectomy. Continue dual antiplatelets and high intensity statins.  Status post left carotid endarterectomy.  Follow-up with vascular surgery in 3 weeks.  Essential hypertension.  Resume losartan from home on discharge.  Dyslipidemia.  Lipitor dose was increased to 80 mg daily..   Chronic low back pain.  Supportive care.  Norco as prescribed.   Ongoing smoking.  Continue nicotine patch on discharge.  Counseling was done.   History of bipolar disorder.  On Lamictal.  Continue on discharge.  Disposition.  At this time, patient is stable for disposition home with outpatient PCP, vascular surgery and neurology follow up.  Medical Consultants:   Vascular surgery Neurology  Procedures:    Left carotid endarterectomy 08/20/20 by vascular surgery Dr. Stanford Breed Subjective:   Today, patient was seen and examined at bedside.  Denies any dizziness, lightheadedness headache.  Complains of mild neck discomfort after surgery  Discharge Exam:   Vitals:   08/21/20 0500 08/21/20 0645  BP: 126/70   Pulse:    Resp: 15 20  Temp:    SpO2:     Vitals:   08/21/20 0300 08/21/20 0400 08/21/20 0500 08/21/20 0645  BP:  127/74 126/70   Pulse:  94    Resp: '15 15 15 20  '$ Temp:  98 F (36.7 C)    TempSrc:  Oral    SpO2:  100%    Weight:      Height:       General: Alert awake, not in obvious distress HENT: pupils equally reacting to light,  No scleral pallor or icterus noted. Oral mucosa is moist.  Left neck carotid  endarterectomy site appears clean dry and intact, without any hematoma. Chest:  Clear breath sounds.  Diminished breath sounds bilaterally. No crackles or wheezes.  CVS: S1 &S2 heard. No murmur.  Regular rate and rhythm. Abdomen: Soft, nontender, nondistended.  Bowel sounds are heard.   Extremities: No cyanosis, clubbing or edema.  Peripheral pulses are palpable. Psych: Alert, awake and oriented, normal mood CNS:  No cranial nerve  deficits.  Power equal in all extremities.   Skin: Warm and dry.  Left neck with surgical incision  The results of significant diagnostics from this hospitalization (including imaging, microbiology, ancillary and laboratory) are listed below for reference.     Diagnostic Studies:   CT Head Wo Contrast  Result Date: 08/15/2020 CLINICAL DATA:  Transient ischemic attack (TIA) EXAM: CT HEAD WITHOUT CONTRAST TECHNIQUE: Contiguous axial images were obtained from the base of the skull through the vertex without intravenous contrast. COMPARISON:  None. FINDINGS: Brain: No evidence of acute large vascular territory infarction, hemorrhage, hydrocephalus, extra-axial collection or mass lesion/mass effect. Predominately periventricular white matter hypodensities, advanced for age. Vascular: No hyperdense vessel identified. Skull: No acute fracture. Sinuses/Orbits: Visualized sinuses are clear. No acute orbital findings. Other: No mastoid effusions. IMPRESSION: Age advanced predominately periventricular white matter hypodensities, which could be secondary to chronic microvascular ischemic disease or demyelination. Given the patient's neurologic symptoms, recommend MRI with contrast to further evaluate. Electronically Signed   By: Margaretha Sheffield MD   On: 08/15/2020 16:26   MR BRAIN W WO CONTRAST  Result Date: 08/16/2020 CLINICAL DATA:  Neuro deficit, acute, stroke suspected. Speech disturbance. EXAM: MRI HEAD WITHOUT AND WITH CONTRAST TECHNIQUE: Multiplanar, multiecho pulse sequences of the brain and surrounding structures were obtained without and with intravenous contrast. CONTRAST:  7.7m GADAVIST GADOBUTROL 1 MMOL/ML IV SOLN COMPARISON:  Head CT 08/15/2020 FINDINGS: Brain: There are small clustered foci of restricted diffusion involving cortex and subcortical white matter in the posterior left frontal lobe with a small amount of associated enhancement there is also a punctate focus of diffusion abnormality and  enhancement involving subcortical white matter of the anterior left frontal lobe. No intracranial hemorrhage, mass, midline shift, or extra-axial fluid collection is identified. There is mild cerebral atrophy. There is widespread chronic cerebral white matter disease characterized by both small discrete and patchy foci of T2 FLAIR hyperintensity in the juxtacortical and deep cerebral white matter as well as more extensive and confluent T2 hyperintensity in the periventricular white matter including involvement of the temporal lobes and with prominent T1 hypointensity. Some of these are oriented perpendicularly to the lateral ventricles. There also small T2 hyperintensities in both cerebellar hemispheres. The brainstem is normal in signal. Vascular: Major intracranial vascular flow voids are preserved. Skull and upper cervical spine: Unremarkable calvarial bone marrow signal. Cervical spine reported separately. Sinuses/Orbits: Unremarkable orbits. Paranasal sinuses and mastoid air cells are clear. Other: None. IMPRESSION: 1. Small foci of restricted diffusion and enhancement in the left frontal lobe most compatible with acute to early subacute MCA infarcts (as opposed to acute demyelination). 2. Advanced chronic white matter disease which may reflect demyelinating disease, chronic small vessel ischemia, or vasculitis. Electronically Signed   By: ALogan BoresM.D.   On: 08/16/2020 12:40   MR Cervical Spine W or Wo Contrast  Result Date: 08/16/2020 CLINICAL DATA:  New onset of inability to speak yesterday. EXAM: MRI CERVICAL SPINE WITHOUT AND WITH CONTRAST TECHNIQUE: Multiplanar and multiecho pulse sequences of the cervical spine, to include the craniocervical junction and cervicothoracic  junction, were obtained without and with intravenous contrast. CONTRAST:  7.71m GADAVIST GADOBUTROL 1 MMOL/ML IV SOLN COMPARISON:  MRI cervical spine 04/14/2019 FINDINGS: Alignment: The overall alignment is maintained. Age advanced  degenerative cervical spondylosis with mild multilevel degenerative subluxations. Moderate straightening of the cervical lordosis appears stable. Vertebrae: No acute bony findings or destructive bony changes. There are progressive endplate reactive changes at C3-4. No findings suspicious for discitis, osteomyelitis or epidural abscess. Cord: Normal cord signal intensity. No cord lesions, syrinx or abnormal areas of cord enhancement. Posterior Fossa, vertebral arteries, paraspinal tissues: No significant findings. Disc levels: C2-3: No significant findings. C3-4: Advanced and progressive degenerative disc disease with a bulging degenerated annulus, osteophytic ridging and uncinate spurring. There is flattening of the ventral thecal sac and obliteration of the ventral CSF space. There is also mild to moderate bilateral foraminal stenosis, left greater than right. C4-5: Advanced degenerative disc disease with a bulging degenerated annulus, osteophytic ridging and uncinate spurring. There is also asymmetric left-sided facet disease. Mass effect on the thecal sac asymmetric to the right with narrowing the ventral CSF space. No significant foraminal stenosis. C5-6: Advanced degenerate disc disease with a bulging degenerated annulus, osteophytic ridging and uncinate spurring. Flattening of the ventral thecal sac and narrowing the ventral CSF space. Mild left foraminal encroachment. C6-7: Interbody fusion changes. Mild osteophytic ridging but no spinal or foraminal stenosis. C7-T1: Uncinate spurring changes with mild foraminal encroachment bilaterally, left greater than right. Significant disc disease noted in the upper thoracic spine. IMPRESSION: 1. Age advanced degenerative cervical spondylosis with multilevel disc disease and facet disease as detailed above. 2. No cord lesions, syrinx or abnormal cord enhancement. 3. No findings suspicious for discitis, osteomyelitis or epidural abscess. 4. Mild to moderate spinal and  bilateral foraminal stenosis, left greater than right at C3-4. 5. Mild spinal and left foraminal encroachment at C5-6. 6. Interbody fusion changes at C6-7. No spinal or foraminal stenosis. 7. Advanced upper thoracic spine disc disease. Electronically Signed   By: PMarijo SanesM.D.   On: 08/16/2020 12:30   ECHOCARDIOGRAM COMPLETE  Result Date: 08/16/2020    ECHOCARDIOGRAM REPORT   Patient Name:   SSTARRLA METOXENDate of Exam: 08/16/2020 Medical Rec #:  [id number]       Height:       69.0 in Accession #:    [id number]      Weight:       154.3 lb Date of Birth:  [birth year]       BSA:          1.850 m Patient Age:    89years         BP:           120/70 mmHg Patient Gender: F                HR:           70 bpm. Exam Location:  [facility] NaProcedure: 2D Echo Indications:    TIA  Sonographer:    LCarl BestRN Referring Phys: [id number] ADEFESO IMPRESSIONS  1. Left ventricular ejection fraction, by estimation, is 55 to 60%. The left ventricle has normal function. The left ventricle has no regional wall motion abnormalities. There is mild left ventricular hypertrophy. Left ventricular diastolic parameters are indeterminate.  2. Right ventricular systolic function is normal. The right ventricular size is normal.  3. The mitral valve is normal in structure. No evidence of mitral valve regurgitation.  4. The  aortic valve is normal in structure. Aortic valve regurgitation is not visualized.  5. The inferior vena cava is normal in size with greater than 50% respiratory variability, suggesting right atrial pressure of 3 mmHg. FINDINGS  Left Ventricle: Left ventricular ejection fraction, by estimation, is 55 to 60%. The left ventricle has normal function. The left ventricle has no regional wall motion abnormalities. The left ventricular internal cavity size was normal in size. There is  mild left ventricular hypertrophy. Left ventricular diastolic parameters are indeterminate. Right Ventricle: The right ventricular  size is normal. Right vetricular wall thickness was not assessed. Right ventricular systolic function is normal. Left Atrium: Left atrial size was normal in size. Right Atrium: Right atrial size was normal in size. Pericardium: Trivial pericardial effusion is present. Mitral Valve: The mitral valve is normal in structure. No evidence of mitral valve regurgitation. Tricuspid Valve: The tricuspid valve is normal in structure. Tricuspid valve regurgitation is trivial. Aortic Valve: The aortic valve is normal in structure. Aortic valve regurgitation is not visualized. Pulmonic Valve: The pulmonic valve was normal in structure. Pulmonic valve regurgitation is not visualized. Aorta: The aortic root and ascending aorta are structurally normal, with no evidence of dilitation. Venous: The inferior vena cava is normal in size with greater than 50% respiratory variability, suggesting right atrial pressure of 3 mmHg. IAS/Shunts: No atrial level shunt detected by color flow Doppler.  LEFT VENTRICLE PLAX 2D LVIDd:         3.70 cm  Diastology LVIDs:         2.90 cm  LV e' medial:    6.85 cm/s LV PW:         1.20 cm  LV E/e' medial:  15.5 LV IVS:        1.00 cm  LV e' lateral:   7.07 cm/s LVOT diam:     1.80 cm  LV E/e' lateral: 15.0 LV SV:         41 LV SV Index:   22 LVOT Area:     2.54 cm  RIGHT VENTRICLE            IVC RV Basal diam:  2.70 cm    IVC diam: 1.30 cm RV S prime:     6.85 cm/s TAPSE (M-mode): 1.7 cm LEFT ATRIUM             Index       RIGHT ATRIUM          Index LA diam:        3.30 cm 1.78 cm/m  RA Area:     9.66 cm LA Vol (A2C):   46.2 ml 24.97 ml/m RA Volume:   22.00 ml 11.89 ml/m LA Vol (A4C):   23.6 ml 12.75 ml/m LA Biplane Vol: 34.5 ml 18.64 ml/m  AORTIC VALVE LVOT Vmax:   85.40 cm/s LVOT Vmean:  55.100 cm/s LVOT VTI:    0.162 m  AORTA Ao Root diam: 2.70 cm Ao Asc diam:  2.30 cm MITRAL VALVE MV Area (PHT): 3.39 cm     SHUNTS MV Decel Time: 224 msec     Systemic VTI:  0.16 m MV E velocity: 106.00 cm/s   Systemic Diam: 1.80 cm MV A velocity: 111.00 cm/s MV E/A ratio:  0.95 Dorris Carnes MD Electronically signed by Dorris Carnes MD Signature Date/Time: 08/16/2020/2:09:03 PM    Final      Labs:   Basic Metabolic Panel: Recent Labs  Lab 08/15/20 1637 08/16/20 0754 08/17/20 0510  08/20/20 0422 08/21/20 0520  NA 138 137 137 138 137  K 3.4* 4.1 3.8 4.1 4.3  CL 106 104 102 103 104  CO2 '26 26 29 27 23  '$ GLUCOSE 106* 86 116* 100* 113*  BUN 21* 22* 26* 22* 21*  CREATININE 1.16* 0.99 0.96 1.22* 1.09*  CALCIUM 8.6* 9.1 8.9 8.7* 8.7*  MG  --  2.1  --  1.9 1.7  PHOS  --  4.5  --   --   --    GFR Estimated Creatinine Clearance: 60.2 mL/min (A) (by C-G formula based on SCr of 1.09 mg/dL (H)). Liver Function Tests: Recent Labs  Lab 08/15/20 1637 08/16/20 0754 08/20/20 0422 08/21/20 0520  AST 13* 14* 15 17  ALT '16 17 16 16  '$ ALKPHOS 64 62 63 56  BILITOT 0.6 0.7 0.6 0.6  PROT 6.6 6.8 5.7* 5.6*  ALBUMIN 3.6 3.6 2.9* 2.8*   No results for input(s): LIPASE, AMYLASE in the last 168 hours. No results for input(s): AMMONIA in the last 168 hours. Coagulation profile Recent Labs  Lab 08/16/20 0754  INR 0.9    CBC: Recent Labs  Lab 08/15/20 1637 08/16/20 0754 08/17/20 0510 08/18/20 0428 08/20/20 0422 08/21/20 0520  WBC 16.4* 13.0* 17.7* 15.0* 14.0* 18.5*  NEUTROABS 5.6  --   --   --  6.6 14.6*  HGB 14.3 14.3 14.8 14.1 13.5 12.2  HCT 44.1 45.1 46.3* 43.9 41.6 37.9  MCV 86.3 86.4 85.7 85.1 85.8 86.1  PLT 317 308 318 324 295 276   Cardiac Enzymes: No results for input(s): CKTOTAL, CKMB, CKMBINDEX, TROPONINI in the last 168 hours. BNP: Invalid input(s): POCBNP CBG: No results for input(s): GLUCAP in the last 168 hours. D-Dimer No results for input(s): DDIMER in the last 72 hours. Hgb A1c No results for input(s): HGBA1C in the last 72 hours. Lipid Profile Recent Labs    08/21/20 0520  CHOL 149  HDL 58  LDLCALC 70  TRIG 106  CHOLHDL 2.6   Thyroid function studies No results  for input(s): TSH, T4TOTAL, T3FREE, THYROIDAB in the last 72 hours.  Invalid input(s): FREET3 Anemia work up No results for input(s): VITAMINB12, FOLATE, FERRITIN, TIBC, IRON, RETICCTPCT in the last 72 hours. Microbiology Recent Results (from the past 240 hour(s))  Resp Panel by RT-PCR (Flu A&B, Covid) Nasopharyngeal Swab     Status: None   Collection Time: 08/15/20  8:06 PM   Specimen: Nasopharyngeal Swab; Nasopharyngeal(NP) swabs in vial transport medium  Result Value Ref Range Status   SARS Coronavirus 2 by RT PCR NEGATIVE NEGATIVE Final    Comment: (NOTE) SARS-CoV-2 target nucleic acids are NOT DETECTED.  The SARS-CoV-2 RNA is generally detectable in upper respiratory specimens during the acute phase of infection. The lowest concentration of SARS-CoV-2 viral copies this assay can detect is 138 copies/mL. A negative result does not preclude SARS-Cov-2 infection and should not be used as the sole basis for treatment or other patient management decisions. A negative result may occur with  improper specimen collection/handling, submission of specimen other than nasopharyngeal swab, presence of viral mutation(s) within the areas targeted by this assay, and inadequate number of viral copies(<138 copies/mL). A negative result must be combined with clinical observations, patient history, and epidemiological information. The expected result is Negative.  Fact Sheet for Patients:  EntrepreneurPulse.com.au  Fact Sheet for Healthcare Providers:  IncredibleEmployment.be  This test is no t yet approved or cleared by the [place] and  has been authorized  for detection and/or diagnosis of SARS-CoV-2 by FDA under an Emergency Use Authorization (EUA). This EUA will remain  in effect (meaning this test can be used) for the duration of the COVID-19 declaration under Section 564(b)(1) of the Act, 21 U.S.C.section 360bbb-3(b)(1), unless the  authorization is terminated  or revoked sooner.       Influenza A by PCR NEGATIVE NEGATIVE Final   Influenza B by PCR NEGATIVE NEGATIVE Final    Comment: (NOTE) The Xpert Xpress SARS-CoV-2/FLU/RSV plus assay is intended as an aid in the diagnosis of influenza from Nasopharyngeal swab specimens and should not be used as a sole basis for treatment. Nasal washings and aspirates are unacceptable for Xpert Xpress SARS-CoV-2/FLU/RSV testing.  Fact Sheet for Patients: EntrepreneurPulse.com.au  Fact Sheet for Healthcare Providers: IncredibleEmployment.be  This test is not yet approved or cleared by the [place] FDA and has been authorized for detection and/or diagnosis of SARS-CoV-2 by FDA under an Emergency Use Authorization (EUA). This EUA will remain in effect (meaning this test can be used) for the duration of the COVID-19 declaration under Section 564(b)(1) of the Act, 21 U.S.C. section 360bbb-3(b)(1), unless the authorization is terminated or revoked.  Performed at [facility], [place] [address], [place], [place] [address]   Surgical pcr screen     Status: None   Collection Time: 08/20/20 10:38 AM   Specimen: Nasal Mucosa; Nasal Swab  Result Value Ref Range Status   MRSA, PCR NEGATIVE NEGATIVE Final   Staphylococcus aureus NEGATIVE NEGATIVE Final    Comment: (NOTE) The Xpert SA Assay (FDA approved for NASAL specimens in patients 76 years of age and older), is one component of a comprehensive surveillance program. It is not intended to diagnose infection nor to guide or monitor treatment. Performed at [facility], [place] [address] [place], [place] [address]      Discharge Instructions:   Discharge Instructions     Ambulatory referral to Neurology   Complete by: As directed    Follow up with Dr. Krista Blue at [facility] in 4 weeks. Dr. Krista Blue is pt mom's neurologist, and the patient would like to see Dr Krista Blue also. Thanks.    Call MD for:  redness, tenderness, or signs of infection (pain, swelling, redness, odor or green/yellow discharge around incision site)   Complete by: As directed    Call MD for:  severe uncontrolled pain   Complete by: As directed    Call MD for:  temperature >100.4   Complete by: As directed    Diet - low sodium heart healthy   Complete by: As directed    Discharge instructions   Complete by: As directed    Follow up with vascular surgery in 3 weeks and with neurology ( as scheduled by the clinic). Continue medications as prescribed.   Discharge wound care:   Complete by: As directed    Local wound care   Increase activity slowly   Complete by: As directed       Allergies as of 08/21/2020       Reactions   Iohexol Hives   Hives 06/06/04, needs pre meds   Morphine And Related Hives, Itching   Denies Airway involvement   Sulfa Antibiotics Itching, Rash   Reported with TMP/ SMX  Denies Airway involvement        Medication List     STOP taking these medications    predniSONE 10 MG (21) Tbpk tablet Commonly known as: STERAPRED UNI-PAK 21 TAB  TAKE these medications    albuterol 108 (90 Base) MCG/ACT inhaler Commonly known as: VENTOLIN HFA TAKE 2 PUFFS BY MOUTH EVERY 6 HOURS AS NEEDED FOR WHEEZE What changed: See the new instructions.   aspirin 81 MG EC tablet Take 1 tablet (81 mg total) by mouth daily. Swallow whole.   atorvastatin 80 MG tablet Commonly known as: LIPITOR Take 1 tablet (80 mg total) by mouth daily. What changed:  medication strength how much to take   clopidogrel 75 MG tablet Commonly known as: PLAVIX Take 1 tablet (75 mg total) by mouth daily.   HYDROcodone-acetaminophen 5-325 MG tablet Commonly known as: NORCO/VICODIN Take 1 tablet by mouth 3 (three) times daily as needed for moderate pain or severe pain.   lamoTRIgine 200 MG tablet Commonly known as: LAMICTAL TAKE 1 TABLET BY MOUTH EVERY DAY   losartan 25 MG  tablet Commonly known as: COZAAR TAKE 1 TABLET BY MOUTH EVERYDAY AT BEDTIME   nicotine polacrilex 2 MG gum Commonly known as: NICORETTE Take 1 each (2 mg total) by mouth as needed for smoking cessation.   traZODone 100 MG tablet Commonly known as: DESYREL TAKE 1 TABLET BY MOUTH EVERYDAY AT BEDTIME               Discharge Care Instructions  (From admission, onward)           Start     Ordered   08/21/20 0000  Discharge wound care:       Comments: Local wound care   08/21/20 0850            Follow-up Information     Marcial Pacas, MD. Schedule an appointment as soon as possible for a visit in 1 month(s).   Specialty: Neurology Contact information: [place] [address] [phone]         Cherre Robins, MD Follow up in 3 week(s).   Specialties: Vascular Surgery, Interventional Cardiology Contact information: [address] [place] [address] [phone]                  Time coordinating discharge: 39 minutes  Signed:  Reece Fehnel  [facility] 08/21/2020, 8:51 AM

## 2020-08-21 NOTE — Progress Notes (Addendum)
  Progress Note    08/21/2020 7:40 AM 1 Day Post-Op  Subjective:  No neuro events overnight   Vitals:   08/21/20 0500 08/21/20 0645  BP: 126/70   Pulse:    Resp: 15 20  Temp:    SpO2:     Physical Exam: Lungs:  non labored Incisions:  L neck incision c/d/i Extremities:  moving all extremities well Neurologic: CN grossly intact  CBC    Component Value Date/Time   WBC 18.5 (H) 08/21/2020 0520   RBC 4.40 08/21/2020 0520   HGB 12.2 08/21/2020 0520   HGB 14.2 11/22/2017 1054   HCT 37.9 08/21/2020 0520   HCT 42.4 11/22/2017 1054   PLT 276 08/21/2020 0520   PLT 415 11/22/2017 1054   MCV 86.1 08/21/2020 0520   MCV 80 11/22/2017 1054   MCH 27.7 08/21/2020 0520   MCHC 32.2 08/21/2020 0520   RDW 14.6 08/21/2020 0520   RDW 13.1 11/22/2017 1054   LYMPHSABS 3.0 08/21/2020 0520   LYMPHSABS 5.1 (H) 11/22/2017 1054   MONOABS 0.8 08/21/2020 0520   EOSABS 0.0 08/21/2020 0520   EOSABS 0.0 11/22/2017 1054   BASOSABS 0.0 08/21/2020 0520   BASOSABS 0.1 11/22/2017 1054    BMET    Component Value Date/Time   NA 137 08/21/2020 0520   NA 142 11/26/2019 1117   K 4.3 08/21/2020 0520   CL 104 08/21/2020 0520   CO2 23 08/21/2020 0520   GLUCOSE 113 (H) 08/21/2020 0520   BUN 21 (H) 08/21/2020 0520   BUN 14 11/26/2019 1117   CREATININE 1.09 (H) 08/21/2020 0520   CREATININE 1.01 11/25/2015 1408   CALCIUM 8.7 (L) 08/21/2020 0520   GFRNONAA 60 (L) 08/21/2020 0520   GFRNONAA 71 11/29/2013 1032   GFRAA 68 11/26/2019 1117   GFRAA 82 11/29/2013 1032    INR    Component Value Date/Time   INR 0.9 08/16/2020 0754     Intake/Output Summary (Last 24 hours) at 08/21/2020 0740 Last data filed at 08/21/2020 0307 Gross per 24 hour  Intake 2995.56 ml  Output 500 ml  Net 2495.56 ml     Assessment/Plan:  55 y.o. female is s/p L CEA 1 Day Post-Op   Neuro exam remains at baseline L neck incision without hematoma Ok for discharge from vascular standpoint Office will arrange follow up  in about 2-3 weeks   Dagoberto Ligas, PA-C [facility] [phone] 08/21/2020 7:40 AM  VASCULAR STAFF ADDENDUM: I have independently interviewed and examined the patient. I agree with the above.  Ready for discharge. Follow up in 2-3 weeks.  [name]. Stanford Breed, MD [facility] Phone Number: [phone] 08/21/2020 9:34 AM

## 2020-08-21 NOTE — Progress Notes (Signed)
Discharge instructions (including medications) discussed with and copy provided to patient/caregiver. All belongings sent with patient. 

## 2020-08-21 NOTE — Progress Notes (Signed)
PHARMACIST LIPID MONITORING   [name] is a 55 y.o. female admitted on 08/15/2020 with L carotid stenosis s/p endarterectomy in the setting of CVA.  Pharmacy has been consulted to optimize lipid-lowering therapy with the indication of secondary prevention for clinical ASCVD.  Recent Labs:  Lipid Panel (last 6 months):   Lab Results  Component Value Date   CHOL 149 08/21/2020   TRIG 106 08/21/2020   HDL 58 08/21/2020   CHOLHDL 2.6 08/21/2020   VLDL 21 08/21/2020   LDLCALC 70 08/21/2020    Hepatic function panel (last 6 months):   Lab Results  Component Value Date   AST 17 08/21/2020   ALT 16 08/21/2020   ALKPHOS 56 08/21/2020   BILITOT 0.6 08/21/2020    SCr (since admission):   Serum creatinine: 1.09 mg/dL (H) 08/21/20 0520 Estimated creatinine clearance: 60.2 mL/min (A)  Current therapy and lipid therapy tolerance Current lipid-lowering therapy: atorvastatin '80mg'$   Previous lipid-lowering therapies (if applicable): n/a Documented or reported allergies or intolerances to lipid-lowering therapies (if applicable): none  Assessment:   Patient prefers no changes in lipid-lowering therapy at this time due to not indicated. LDL above goal at the time of stroke, now improved to goal.   Plan:    1.Statin intensity (high intensity recommended for all patients regardless of the LDL):  No statin changes. The patient is already on a high intensity statin.  2.Add ezetimibe (if any one of the following):   Not indicated at this time.  3.Refer to lipid clinic:   No  4.Follow-up with:  Cardiology provider - Buford Dresser, MD  5.Follow-up labs after discharge:  No changes in lipid therapy, repeat a lipid panel in one year.     Benetta Spar, PharmD, BCPS, BCCP Clinical Pharmacist  Please check AMION for all [place] phone numbers After 10:00 PM, call [place] [phone]

## 2020-08-22 ENCOUNTER — Other Ambulatory Visit: Payer: Self-pay | Admitting: Physician Assistant

## 2020-08-22 ENCOUNTER — Telehealth: Payer: Self-pay

## 2020-08-22 NOTE — Telephone Encounter (Signed)
Pt called with c/o L CEA incision feeling very painful, like a "hot fire through neck". She is taking Norco with no relief. Pt states she has a high tolerance to pain medication due to previous back issues. She denies difficulty swallowing. Incision looks like it did when she came home from hospital. Per APP, pt has been advised to go to ED to be seen there. Pt verbalized understanding.

## 2020-08-25 ENCOUNTER — Other Ambulatory Visit: Payer: Self-pay | Admitting: Obstetrics and Gynecology

## 2020-08-25 DIAGNOSIS — Z1231 Encounter for screening mammogram for malignant neoplasm of breast: Secondary | ICD-10-CM

## 2020-08-25 NOTE — Op Note (Signed)
DATE OF SERVICE: 08/20/20  PATIENT:  [name]  55 y.o. female  PRE-OPERATIVE DIAGNOSIS:  symptomatic left carotid artery stenosis  POST-OPERATIVE DIAGNOSIS:  Same  PROCEDURE:   Left carotid endarterectomy and bovine pericardial patch angioplasty  SURGEON:  Surgeon(s) and Role:    * Cherre Robins, MD - Primary  ASSISTANT: Leontine Locket, PA-C  An assistant was required to facilitate exposure and expedite the case.  ANESTHESIA:   general  EBL: 168m  BLOOD ADMINISTERED:none  DRAINS: none   LOCAL MEDICATIONS USED:  NONE  SPECIMEN:  none  COUNTS: confirmed correct .  TOURNIQUET:  none  PATIENT DISPOSITION:  PACU - hemodynamically stable.   Delay start of Pharmacological VTE agent (>24hrs) due to surgical blood loss or risk of bleeding: no  INDICATION FOR PROCEDURE: Kimberly KHUONGis a 55y.o. female with symptomatic severe left carotid artery stenosis. Pre operative imaging showed anatomic detail amenable to endarterectomy. After careful discussion of risks, benefits, and alternatives the patient was offered carotid endarterectomy. We specifically discussed risk of stroke, cranial nerve injury, hematoma. The patient understood and wished to proceed.  OPERATIVE FINDINGS: Severe focal left internal carotid artery stenosis.  10 French shunt used throughout the case.  Good technical result from endarterectomy.  Unremarkable patch angioplasty.  Patient awoke neurologically intact.  DESCRIPTION OF PROCEDURE: After identification of the patient in the pre-operative holding area, the patient was transferred to the operating room. The patient was positioned supine on the operating room table. Anesthesia was induced. The left neck was prepped and draped in standard fashion. A surgical pause was performed confirming correct patient, procedure, and operative location.  Intraoperative ultrasound the course of the carotid artery was marked on the skin on the left neck.  An  incision was made anterior to the sternocleidomastoid and carried down through the subcutaneous tissue and platysma using Bovie electrocautery.  The platysma was grasped and elevated.  An avascular plane was developed over the sternocleidomastoid retracting this posteriorly and laterally.  The carotid sheath was identified.  The jugular vein was skeletonized.  The facial vein was identified, skeletonized, ligated, and divided.  This exposed the carotid artery bifurcation.  The carotid arteries were exposed from healthy common carotid artery to healthy internal carotid artery.  The vagus nerve was identified and protected.  The hypoglossal nerve was identified and protected.  Patient was systemically heparinized.  Activated clotting time measurements were used throughout the case to confirm adequate anticoagulation.  The internal carotid artery, common carotid artery, external carotid artery were clamped in order.  An anterior arteriotomy was made on the common carotid artery and extended with Potts scissors.  We confirmed a critical stenosis of the proximal internal carotid artery.  A 10 French shunt was inserted into the internal carotid artery, followed by the common carotid artery.  Previously placed Silastic Vesseloops and Rummel tourniquet were cinched down and hemostasis was good around the shunt.  Doppler machine was used to confirm flow within the shunt.  Endarterectomy was then performed using a [employer] scientist  The entire plaque was removed.  Both the proximal and distal endpoints were carefully inspected.  No flaps or loose debris were identified.  The external carotid artery orifice underwent eversion endarterectomy.  We again carefully inspected the endarterectomy plane.  No loose debris or flaps were identified.  A bovine pericardial patch was brought onto the field and shaped for patch angioplasty.  This was sewn into the arteriotomy using continuous running suture of 6-0  Prolene.  Immediately prior  to completion the shunt was removed from the internal carotid artery followed by the common carotid artery.  Clamps were reapplied.  The patch was completed.  Before tying the patch the repair was flushed and de-aired.  The repair was completed.  Clamps were released on the external carotid artery, followed by the common carotid artery.  Several beats of systole were allowed to occur before releasing the clamp on the internal carotid artery.  A Doppler machine was brought in the field and used to interrogate the repair.  Continuous forward diastolic flow was heard in the internal carotid artery.  Sharp high resistance waveform was heard in the external carotid artery.  Mixed waveform was heard in the common carotid artery.  Intraoperative ultrasound was then used to evaluate the repair.  I saw no intraluminal debris or flaps.  Satisfied we ended the intervention here.  Heparin was reversed with protamine.  Hemostasis was achieved in the patch and the surgical bed.  The incision was closed in layers using 3-0 Vicryl for the platysma and 4-0 Monocryl for the subcuticular layer.  Dermabond was applied.  The patient awoke neurologically intact.  Upon completion of the case instrument and sharps counts were confirmed correct. The patient was transferred to the PACU in good condition. I was present for all portions of the procedure.  [name]. Stanford Breed, MD [facility] Phone Number: [phone] 08/25/2020 7:51 AM

## 2020-09-02 DIAGNOSIS — L02214 Cutaneous abscess of groin: Secondary | ICD-10-CM | POA: Diagnosis not present

## 2020-09-04 ENCOUNTER — Ambulatory Visit: Payer: Medicare Other

## 2020-09-05 ENCOUNTER — Other Ambulatory Visit: Payer: Self-pay

## 2020-09-05 ENCOUNTER — Ambulatory Visit
Admission: RE | Admit: 2020-09-05 | Discharge: 2020-09-05 | Disposition: A | Discharge status: Home / Self Care | Payer: Medicare Other | Source: Ambulatory Visit | Attending: Obstetrics and Gynecology | Admitting: Obstetrics and Gynecology

## 2020-09-05 DIAGNOSIS — Z1231 Encounter for screening mammogram for malignant neoplasm of breast: Secondary | ICD-10-CM

## 2020-09-18 ENCOUNTER — Other Ambulatory Visit: Payer: Self-pay

## 2020-09-18 DIAGNOSIS — I63239 Cerebral infarction due to unspecified occlusion or stenosis of unspecified carotid arteries: Secondary | ICD-10-CM

## 2020-09-18 NOTE — Progress Notes (Deleted)
Error

## 2020-09-18 NOTE — Addendum Note (Signed)
Addended byDoylene Bode on: 09/18/2020 03:56 PM   Modules accepted: Orders

## 2020-09-19 ENCOUNTER — Other Ambulatory Visit: Payer: Self-pay | Admitting: Family Medicine

## 2020-09-23 ENCOUNTER — Ambulatory Visit (HOSPITAL_COMMUNITY)
Admission: RE | Admit: 2020-09-23 | Discharge: 2020-09-23 | Disposition: A | Discharge status: Home / Self Care | Payer: Medicare Other | Source: Ambulatory Visit | Attending: Vascular Surgery | Admitting: Vascular Surgery

## 2020-09-23 ENCOUNTER — Ambulatory Visit (INDEPENDENT_AMBULATORY_CARE_PROVIDER_SITE_OTHER): Payer: Medicare Other | Admitting: Vascular Surgery

## 2020-09-23 ENCOUNTER — Other Ambulatory Visit: Payer: Self-pay

## 2020-09-23 ENCOUNTER — Encounter: Payer: Self-pay | Admitting: Vascular Surgery

## 2020-09-23 VITALS — BP 134/81 | HR 103 | Temp 97.9°F | Resp 20 | Ht 69.0 in | Wt 173.0 lb

## 2020-09-23 DIAGNOSIS — I63239 Cerebral infarction due to unspecified occlusion or stenosis of unspecified carotid arteries: Secondary | ICD-10-CM | POA: Insufficient documentation

## 2020-09-23 DIAGNOSIS — Z9889 Other specified postprocedural states: Secondary | ICD-10-CM

## 2020-09-23 NOTE — Progress Notes (Signed)
[facility] [place] PROGRESS NOTE  ASSESSMENT / PLAN: SARSHA Duncan is a 55 y.o. female status post left carotid endarterectomy 08/21/20 for symptomatic left carotid artery stenosis.  Patient has done extremely well postoperatively.  Counseled her to continue aspirin, statin therapies indefinitely.  Follow-up with [place] in 1 year with repeat carotid artery duplex.  SUBJECTIVE: Patient returns to clinic for postop evaluation.  As usual, she has maintained a excellent sense of humor.  She reports she has done quite well since surgery.  Her only complaint is some numbness about the neck below the left mandible.  OBJECTIVE: BP 134/81 (BP Location: Left Arm, Patient Position: Sitting, Cuff Size: Large)   Pulse (!) 103   Temp 97.9 F (36.6 C)   Resp 20   Ht '5\' 9"'$  (1.753 m)   Wt 173 lb (78.5 kg)   SpO2 99%   BMI 25.55 kg/m   No acute distress Left neck soft with well-healing incision Neurologically intact, cranial nerves intact, 5 out of 5 strength throughout, normal gait and station Regular rate and rhythm Unlabored breathing  CBC Latest Ref Rng & Units 08/21/2020 08/20/2020 08/18/2020  WBC 4.0 - 10.5 K/uL 18.5(H) 14.0(H) 15.0(H)  Hemoglobin 12.0 - 15.0 g/dL 12.2 13.5 14.1  Hematocrit 36.0 - 46.0 % 37.9 41.6 43.9  Platelets 150 - 400 K/uL 276 295 324     CMP Latest Ref Rng & Units 08/21/2020 08/20/2020 08/17/2020  Glucose 70 - 99 mg/dL 113(H) 100(H) 116(H)  BUN 6 - 20 mg/dL 21(H) 22(H) 26(H)  Creatinine 0.44 - 1.00 mg/dL 1.09(H) 1.22(H) 0.96  Sodium 135 - 145 mmol/L 137 138 137  Potassium 3.5 - 5.1 mmol/L 4.3 4.1 3.8  Chloride 98 - 111 mmol/L 104 103 102  CO2 22 - 32 mmol/L '23 27 29  '$ Calcium 8.9 - 10.3 mg/dL 8.7(L) 8.7(L) 8.9  Total Protein 6.5 - 8.1 g/dL 5.6(L) 5.7(L) -  Total Bilirubin 0.3 - 1.2 mg/dL 0.6 0.6 -  Alkaline Phos 38 - 126 U/L 56 63 -  AST 15 - 41 U/L 17 15 -  ALT 0 - 44 U/L 16 16 -    CrCl cannot be calculated (Patient's most recent lab result  is older than the maximum 21 days allowed.).  Duplex personally reviewed No evidence of hemodynamically significant stenosis in carotid arteries bilaterally; no technical problems with the patch repair.  [name]. Stanford Breed, MD [facility] Phone Number: [phone] 09/23/2020 4:46 PM

## 2020-10-04 DIAGNOSIS — Z23 Encounter for immunization: Secondary | ICD-10-CM | POA: Diagnosis not present

## 2020-10-27 ENCOUNTER — Other Ambulatory Visit: Payer: Self-pay | Admitting: Family Medicine

## 2020-10-28 ENCOUNTER — Ambulatory Visit (INDEPENDENT_AMBULATORY_CARE_PROVIDER_SITE_OTHER): Payer: Medicare Other | Admitting: Neurology

## 2020-10-28 VITALS — BP 136/77 | HR 99 | Ht 69.0 in | Wt 176.5 lb

## 2020-10-28 DIAGNOSIS — N882 Stricture and stenosis of cervix uteri: Secondary | ICD-10-CM

## 2020-10-28 DIAGNOSIS — E559 Vitamin D deficiency, unspecified: Secondary | ICD-10-CM | POA: Diagnosis not present

## 2020-10-28 DIAGNOSIS — R930 Abnormal findings on diagnostic imaging of skull and head, not elsewhere classified: Secondary | ICD-10-CM | POA: Diagnosis not present

## 2020-10-28 DIAGNOSIS — I639 Cerebral infarction, unspecified: Secondary | ICD-10-CM | POA: Insufficient documentation

## 2020-10-28 DIAGNOSIS — R6889 Other general symptoms and signs: Secondary | ICD-10-CM | POA: Diagnosis not present

## 2020-10-28 NOTE — Progress Notes (Signed)
Chief Complaint  Patient presents with   New Patient (Initial Visit)    New room. Pt alone. She reports she is doing well.       ASSESSMENT AND PLAN  [name] is a 55 y.o. female   Acute left frontal cortical stroke on August 15, 2020 (watershed area of left MCA/ACA) High-grade left internal carotid artery stenosis, status post left endarterectomy on August 21, 2020,  Vascular risk factor of aging, smoking, hypertension, hyperlipidemia  On aspirin 81 mg daily  Significant abnormal MRI of the brain, multiple black holes, thinning of corpus callosum, periventricular lesions, perpendicular to ventricle, suggest the possibility of multiple sclerosis,  Laboratory evaluation to rule out inflammation, infections etiology  May consider lumbar puncture for oligoclonal band, 6 months post her stroke in August 2022.  Cervical stenosis at C 3-4, no cervical cord signal abnormalities    Return to clinic in 6 months, call for acute issues     DIAGNOSTIC DATA (LABS, IMAGING, TESTING) - I reviewed patient records, labs, notes, testing and imaging myself where available.  Laboratory evaluation 2022, lipid panel: LDL 70, CBC, elevated WBC of 18.5, hemoglobin of 12.2, CMP, mild elevated creatinine 1.09, BUN of 21, A1c 6.1 MEDICAL HISTORY:  [name], is a 55 year old female, seen in request by neuro hospitalist Dr. Rosalin Hawking to follow-up on stroke, her primary care physician is [name], [name], initial evaluation was October 28, 2020  I reviewed and summarized the referring note. PMHx. HTN HLD Smoke, 11ppd x 20 years. 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 [name] regularly, on July 28th, 2022, she received epidural injection for upper back pain,  Next day on August 08, 2020 she woke up from overnight sleep, noticed right hand  weakness, has difficulty using the right first and second fingers, Dr. [name] call in prednisone package for her, She was Right hand weakness gradually improved, lasting for about a week, on August 15, 2020, she was talking with her colleague, had a sudden onset of word finding difficulties, which lasted less than 1 minute, she drove herself to [facility] emergency room, worried about a stroke,  She was admitted to hospital for possible TIA, personally MRI of the brain and reviewed with neuro radiologist Dr. Felecia Shelling, positive DWI lesion at left frontal lobe, watershed area, advanced chronic white matter disease, perpendicular to ventricle, morphology is very suggestive of demyelinating disease  CT angiogram of head and neck showed severe stenosis of proximal left internal carotid artery, occlusion of proximal right vertebral artery, no significant right internal carotid artery stenosis  She underwent left carotid endarterectomy on August 21, 2020, recovering well  She now declines focal deficit, there was no history of sudden visual loss, no gait abnormality,  Her maternal aunt suffered multiple sclerosis  She had long known history of cervical spinal stenosis, MRI of cervical spine with without contrast August 16, 2020, age advanced degenerative cervical spondylosis, with multilevel disc disease, facet disease, moderate spinal stenosis, bilateral foraminal narrowing at C3-4, no cord signal changes,     PHYSICAL EXAM:   Vitals:   10/28/20 1251  BP: 136/77  Pulse: 99  Weight: 176 lb 8 oz (80.1 kg)  Height: 5\' 9"  (1.753 m)   Not recorded     Body mass index is 26.06 kg/m.  PHYSICAL EXAMNIATION:  Gen: NAD, conversant, well nourised,  well groomed                     Cardiovascular: Regular rate rhythm, no peripheral edema, warm, nontender. Eyes: Conjunctivae clear without exudates or hemorrhage Neck: Supple, no carotid bruits. Pulmonary: Clear to auscultation bilaterally    NEUROLOGICAL EXAM:  MENTAL STATUS: Speech:    Speech is normal; fluent and spontaneous with normal comprehension.  Cognition:     Orientation to time, place and person     Normal recent and remote memory     Normal Attention span and concentration     Normal Language, naming, repeating,spontaneous speech     Fund of knowledge   CRANIAL NERVES: CN II: Visual fields are full to confrontation. Pupils are round equal and briskly reactive to light. CN III, IV, VI: extraocular movement are normal. No ptosis. CN V: Facial sensation is intact to light touch CN VII: Face is symmetric with normal eye closure  CN VIII: Hearing is normal to causal conversation. CN IX, X: Phonation is normal. CN XI: Head turning and shoulder shrug are intact  MOTOR: There is no pronator drift of out-stretched arms. Muscle bulk and tone are normal. Muscle strength is normal.  REFLEXES: Reflexes are 2+ and symmetric at the biceps, triceps, knees, and ankles. Plantar responses are flexor.  SENSORY: Intact to light touch, pinprick and vibratory sensation are intact in fingers and toes.  COORDINATION: There is no trunk or limb dysmetria noted.  GAIT/STANCE: Posture is normal. Gait is steady with normal steps, base, arm swing, and turning. Heel and toe walking are normal. Tandem gait is normal.  Romberg is absent.  REVIEW OF SYSTEMS:  Full 14 system review of systems performed and notable only for as above All other review of systems were negative.   ALLERGIES: Allergies  Allergen Reactions   Iohexol Hives    Hives 06/06/04, needs pre meds    Morphine And Related Hives and Itching    Denies Airway involvement   Sulfa Antibiotics Itching and Rash    Reported with TMP/ SMX  Denies Airway involvement    HOME MEDICATIONS: Current Outpatient Medications  Medication Sig Dispense Refill   albuterol (VENTOLIN HFA) 108 (90 Base) MCG/ACT inhaler TAKE 2 PUFFS BY MOUTH EVERY 6 HOURS AS NEEDED FOR WHEEZE  8 g 5   aspirin EC 81 MG EC tablet Take 1 tablet (81 mg total) by mouth daily. Swallow whole. 30 tablet 11   atorvastatin (LIPITOR) 80 MG tablet TAKE 1 TABLET BY MOUTH EVERY DAY 30 tablet 1   lamoTRIgine (LAMICTAL) 200 MG tablet TAKE 1 TABLET BY MOUTH EVERY DAY 90 tablet 2   losartan (COZAAR) 25 MG tablet TAKE 1 TABLET BY MOUTH EVERYDAY AT BEDTIME 90 tablet 0   nicotine polacrilex (NICORETTE) 2 MG gum Take 1 each (2 mg total) by mouth as needed for smoking cessation. 100 tablet 0   traZODone (DESYREL) 100 MG tablet TAKE 1 TABLET BY MOUTH EVERYDAY AT BEDTIME 90 tablet 2   clopidogrel (PLAVIX) 75 MG tablet Take 1 tablet (75 mg total) by mouth daily. (Patient not taking: Reported on 10/28/2020) 15 tablet 0   No current facility-administered medications for this visit.    PAST MEDICAL HISTORY: Past Medical History:  Diagnosis Date   Bipolar 1 disorder ([place])    Chronic kidney disease    donated left kidney   Hypertension    Pneumonia 2010   had 2 episodes/ was sent home on O2   Pulmonary embolism (  [place]) 1994   Only 1 clot, not on lifelong anticoagulation    PAST SURGICAL HISTORY: Past Surgical History:  Procedure Laterality Date   APPENDECTOMY  2001   ENDARTERECTOMY Left 08/20/2020   Procedure: LEFT CAROTID ARTERY ENDARTERECTOMY;  Surgeon: Cherre Robins, MD;  Location: [facility] OR;  Service: Vascular;  Laterality: Left;   Endometriosis s/p left fallopian tube removal.  2001   [place]   Donated kidney to sister   PATCH ANGIOPLASTY Left 08/20/2020   Procedure: PATCH ANGIOPLASTY WITH 1x6 Weyman Pedro;  Surgeon: Cherre Robins, MD;  Location: [facility] OR;  Service: Vascular;  Laterality: Left;   TONSILLECTOMY      FAMILY HISTORY: Family History  Problem Relation Age of Onset   Breast cancer Mother    CVA Mother    Diabetes Sister    Kidney disease Sister    Breast cancer Maternal Grandmother    Colon cancer Paternal Grandmother    Breast cancer Maternal Aunt      SOCIAL HISTORY: Social History   Socioeconomic History   Marital status: Divorced    Spouse name: Not on file   Number of children: Not on file   Years of education: Not on file   Highest education level: Not on file  Occupational History   Not on file  Tobacco Use   Smoking status: Every Day    Packs/day: 0.50    Years: 20.00    Pack years: 10.00    Types: Cigarettes   Smokeless tobacco: Never  Vaping Use   Vaping Use: Some days  Substance and Sexual Activity   Alcohol use: No    Alcohol/week: 0.0 standard drinks   Drug use: No   Sexual activity: Yes    Birth control/protection: Condom    Comment: Partner with Vasectomy  Other Topics Concern   Not on file  Social History Narrative   Not on file   Social Determinants of Health   Financial Resource Strain: Not on file  Food Insecurity: Not on file  Transportation Needs: Not on file  Physical Activity: Not on file  Stress: Not on file  Social Connections: Not on file  Intimate Partner Violence: Not on file    Total time spent reviewing the chart, obtaining history, examined patient, ordering tests, documentation, consultations and family, care coordination was 40 minutes     Marcial Pacas, M.D. Ph.D.  [facility] [address], [place], [place] [address] Ph: [phone] Fax: [phone]  CC:  Rosalin Hawking, MD [address] [place],  [place] [address]  Gerlene Fee, DO

## 2020-10-30 LAB — VITAMIN B12: Vitamin B-12: >2000 pg/mL — ABNORMAL HIGH (ref 232–1245)

## 2020-10-30 LAB — MULTIPLE MYELOMA PANEL, SERUM
Albumin SerPl Elph-Mcnc: 3.6 g/dL (ref 2.9–4.4)
Albumin/Glob SerPl: 1.1 (ref 0.7–1.7)
Alpha 1: 0.3 g/dL (ref 0.0–0.4)
Alpha2 Glob SerPl Elph-Mcnc: 0.8 g/dL (ref 0.4–1.0)
B-Globulin SerPl Elph-Mcnc: 1.3 g/dL (ref 0.7–1.3)
Gamma Glob SerPl Elph-Mcnc: 0.9 g/dL (ref 0.4–1.8)
Globulin, Total: 3.4 g/dL (ref 2.2–3.9)
IgA/Immunoglobulin A, Serum: 407 mg/dL — ABNORMAL HIGH (ref 87–352)
IgG (Immunoglobin G), Serum: 911 mg/dL (ref 586–1602)
IgM (Immunoglobulin M), Srm: 230 mg/dL — ABNORMAL HIGH (ref 26–217)
Total Protein: 7 g/dL (ref 6.0–8.5)

## 2020-10-30 LAB — CBC WITH DIFFERENTIAL/PLATELET
Basophils Absolute: 0.1 10*3/uL (ref 0.0–0.2)
Basos: 1 %
EOS (ABSOLUTE): 0.2 10*3/uL (ref 0.0–0.4)
Eos: 2 %
Hematocrit: 41.3 % (ref 34.0–46.6)
Hemoglobin: 13.5 g/dL (ref 11.1–15.9)
Immature Grans (Abs): 0 10*3/uL (ref 0.0–0.1)
Immature Granulocytes: 0 %
Lymphocytes Absolute: 5 10*3/uL — ABNORMAL HIGH (ref 0.7–3.1)
Lymphs: 49 %
MCH: 26.9 pg (ref 26.6–33.0)
MCHC: 32.7 g/dL (ref 31.5–35.7)
MCV: 82 fL (ref 79–97)
Monocytes Absolute: 0.8 10*3/uL (ref 0.1–0.9)
Monocytes: 8 %
Neutrophils Absolute: 4 10*3/uL (ref 1.4–7.0)
Neutrophils: 40 %
Platelets: 290 10*3/uL (ref 150–450)
RBC: 5.02 x10E6/uL (ref 3.77–5.28)
RDW: 12.7 % (ref 11.7–15.4)
WBC: 10.1 10*3/uL (ref 3.4–10.8)

## 2020-10-30 LAB — C-REACTIVE PROTEIN: CRP: 3 mg/L (ref 0–10)

## 2020-10-30 LAB — SEDIMENTATION RATE: Sed Rate: 41 mm/hr — ABNORMAL HIGH (ref 0–40)

## 2020-10-30 LAB — COPPER, SERUM: Copper: 124 ug/dL (ref 80–158)

## 2020-10-30 LAB — VITAMIN D 25 HYDROXY (VIT D DEFICIENCY, FRACTURES): Vit D, 25-Hydroxy: 26.2 ng/mL — ABNORMAL LOW (ref 30.0–100.0)

## 2020-10-30 LAB — LYME DISEASE SEROLOGY W/REFLEX: Lyme Total Antibody EIA: NEGATIVE

## 2020-10-30 LAB — RPR: RPR Ser Ql: NONREACTIVE

## 2020-10-30 LAB — ANA W/REFLEX IF POSITIVE: Anti Nuclear Antibody (ANA): NEGATIVE

## 2020-10-30 LAB — CK: Total CK: 161 U/L (ref 32–182)

## 2020-10-31 ENCOUNTER — Telehealth: Payer: Self-pay | Admitting: Neurology

## 2020-10-31 DIAGNOSIS — Z23 Encounter for immunization: Secondary | ICD-10-CM | POA: Diagnosis not present

## 2020-10-31 NOTE — Telephone Encounter (Signed)
Pt is asking for a call to discuss in detail the results that she saw on Mychart

## 2020-11-03 NOTE — Telephone Encounter (Signed)
I called patient. She reviewed Dr. Rhea Belton comments regarding her lab work on [employer]. She is wondering about her elevated vitamin B12 level. She denies taking a [employer] or B12 supplement. She has already made a follow up with her PCP on 11/16 to discuss this further.  She would like a sooner appointment than April to discuss her MRI results and further testing since Dr. Krista Blue mentioned suspecting MS. She denies having MS symptoms at this time. This appointment with Dr. Krista Blue was scheduled for 11/21 at 1:00pm with Dr. Krista Blue.  Patient will call us back with any further questions or concerns.

## 2020-11-04 ENCOUNTER — Other Ambulatory Visit: Payer: Self-pay

## 2020-11-05 MED ORDER — LOSARTAN POTASSIUM 25 MG PO TABS: ORAL_TABLET | ORAL | 0 refills | Status: DC

## 2020-11-12 DIAGNOSIS — N2581 Secondary hyperparathyroidism of renal origin: Secondary | ICD-10-CM | POA: Diagnosis not present

## 2020-11-12 DIAGNOSIS — N182 Chronic kidney disease, stage 2 (mild): Secondary | ICD-10-CM | POA: Diagnosis not present

## 2020-11-12 DIAGNOSIS — D631 Anemia in chronic kidney disease: Secondary | ICD-10-CM | POA: Diagnosis not present

## 2020-11-12 DIAGNOSIS — I129 Hypertensive chronic kidney disease with stage 1 through stage 4 chronic kidney disease, or unspecified chronic kidney disease: Secondary | ICD-10-CM | POA: Diagnosis not present

## 2020-11-18 ENCOUNTER — Other Ambulatory Visit: Payer: Self-pay | Admitting: Family Medicine

## 2020-11-26 ENCOUNTER — Encounter: Payer: Self-pay | Admitting: Family Medicine

## 2020-11-26 ENCOUNTER — Ambulatory Visit (INDEPENDENT_AMBULATORY_CARE_PROVIDER_SITE_OTHER): Payer: Medicare Other | Admitting: Family Medicine

## 2020-11-26 ENCOUNTER — Other Ambulatory Visit: Payer: Self-pay

## 2020-11-26 VITALS — BP 159/80 | HR 115 | Wt 175.8 lb

## 2020-11-26 DIAGNOSIS — Z23 Encounter for immunization: Secondary | ICD-10-CM | POA: Diagnosis not present

## 2020-11-26 DIAGNOSIS — R7303 Prediabetes: Secondary | ICD-10-CM | POA: Diagnosis not present

## 2020-11-26 DIAGNOSIS — Z0000 Encounter for general adult medical examination without abnormal findings: Secondary | ICD-10-CM | POA: Diagnosis not present

## 2020-11-26 DIAGNOSIS — I10 Essential (primary) hypertension: Secondary | ICD-10-CM

## 2020-11-26 LAB — POCT GLYCOSYLATED HEMOGLOBIN (HGB A1C): Hemoglobin A1C: 5.6 % (ref 4.0–5.6)

## 2020-11-26 MED ORDER — SHINGRIX 50 MCG/0.5ML IM SUSR: 0.5000 mL | Freq: Once | INTRAMUSCULAR | 0 refills | Status: AC

## 2020-11-26 MED ORDER — TETANUS-DIPHTH-ACELL PERTUSSIS 5-2.5-18.5 LF-MCG/0.5 IM SUSP
0.5000 mL | Freq: Once | INTRAMUSCULAR | 0 refills | Status: AC
Start: 2020-11-26 — End: 2020-11-26

## 2020-11-26 MED ORDER — LOSARTAN POTASSIUM 50 MG PO TABS: ORAL_TABLET | ORAL | 0 refills | Status: DC

## 2020-11-26 NOTE — Patient Instructions (Signed)
Preventive Care 92-56 Years Old, Female Preventive care refers to lifestyle choices and visits with your health care provider that can promote health and wellness. Preventive care visits are also called wellness exams. What can I expect for my preventive care visit? Counseling Your health care provider may ask you questions about your: Medical history, including: Past medical problems. Family medical history. Pregnancy history. Current health, including: Menstrual cycle. Method of birth control. Emotional well-being. Home life and relationship well-being. Sexual activity and sexual health. Lifestyle, including: Alcohol, nicotine or tobacco, and drug use. Access to firearms. Diet, exercise, and sleep habits. Work and work Statistician. Sunscreen use. Safety issues such as seatbelt and bike helmet use. Physical exam Your health care provider will check your: Height and weight. These may be used to calculate your BMI (body mass index). BMI is a measurement that tells if you are at a healthy weight. Waist circumference. This measures the distance around your waistline. This measurement also tells if you are at a healthy weight and may help predict your risk of certain diseases, such as type 2 diabetes and high blood pressure. Heart rate and blood pressure. Body temperature. Skin for abnormal spots. What immunizations do I need? Vaccines are usually given at various ages, according to a schedule. Your health care provider will recommend vaccines for you based on your age, medical history, and lifestyle or other factors, such as travel or where you work. What tests do I need? Screening Your health care provider may recommend screening tests for certain conditions. This may include: Lipid and cholesterol levels. Diabetes screening. This is done by checking your blood sugar (glucose) after you have not eaten for a while (fasting). Pelvic exam and Pap test. Hepatitis B test. Hepatitis C  test. HIV (human immunodeficiency virus) test. STI (sexually transmitted infection) testing, if you are at risk. Lung cancer screening. Colorectal cancer screening. Mammogram. Talk with your health care provider about when you should start having regular mammograms. This may depend on whether you have a family history of breast cancer. BRCA-related cancer screening. This may be done if you have a family history of breast, ovarian, tubal, or peritoneal cancers. Bone density scan. This is done to screen for osteoporosis. Talk with your health care provider about your test results, treatment options, and if necessary, the need for more tests. Follow these instructions at home: Eating and drinking  Eat a diet that includes fresh fruits and vegetables, whole grains, lean protein, and low-fat dairy products. Take vitamin and mineral supplements as recommended by your health care provider. Do not drink alcohol if: Your health care provider tells you not to drink. You are pregnant, may be pregnant, or are planning to become pregnant. If you drink alcohol: Limit how much you have to 0-1 drink a day. Know how much alcohol is in your drink. In the [place] one drink equals one 12 oz bottle of beer (355 mL), one 5 oz glass of wine (148 mL), or one 1 oz glass of hard liquor (44 mL). Lifestyle Brush your teeth every morning and night with fluoride toothpaste. Floss one time each day. Exercise for at least 30 minutes 5 or more days each week. Do not use any products that contain nicotine or tobacco. These products include cigarettes, chewing tobacco, and vaping devices, such as e-cigarettes. If you need help quitting, ask your health care provider. Do not use drugs. If you are sexually active, practice safe sex. Use a condom or other form of protection to prevent  STIs. If you do not wish to become pregnant, use a form of birth control. If you plan to become pregnant, see your health care provider for a  prepregnancy visit. Take aspirin only as told by your health care provider. Make sure that you understand how much to take and what form to take. Work with your health care provider to find out whether it is safe and beneficial for you to take aspirin daily. Find healthy ways to manage stress, such as: Meditation, yoga, or listening to music. Journaling. Talking to a trusted person. Spending time with friends and family. Minimize exposure to UV radiation to reduce your risk of skin cancer. Safety Always wear your seat belt while driving or riding in a vehicle. Do not drive: If you have been drinking alcohol. Do not ride with someone who has been drinking. When you are tired or distracted. While texting. If you have been using any mind-altering substances or drugs. Wear a helmet and other protective equipment during sports activities. If you have firearms in your house, make sure you follow all gun safety procedures. Seek help if you have been physically or sexually abused. What's next? Visit your health care provider once a year for an annual wellness visit. Ask your health care provider how often you should have your eyes and teeth checked. Stay up to date on all vaccines. This information is not intended to replace advice given to you by your health care provider. Make sure you discuss any questions you have with your health care provider. Document Revised: 06/25/2020 Document Reviewed: 06/25/2020 Elsevier Patient Education  [name].

## 2020-11-26 NOTE — Progress Notes (Signed)
SUBJECTIVE:   Chief compliant/HPI: annual examination  [name] is a 55 y.o. who presents today for an annual exam.   History tabs reviewed and updated as appropriate.   No concerns today.   Hypertension - Medications: Losartan 25 mg qhs - Compliance: Yes - Checking BP at home: No - Denies any SOB, CP, vision changes, LE edema, medication SEs, or symptoms of hypotension   OBJECTIVE:   BP (!) 159/80   Pulse (!) 115   Wt 175 lb 12.8 oz (79.7 kg)   SpO2 98%   BMI 25.96 kg/m   Physical Exam Vitals and nursing note reviewed.  Constitutional:      General: She is not in acute distress.    Appearance: She is not ill-appearing.  HENT:     Head: Normocephalic.     Right Ear: External ear normal.     Left Ear: External ear normal.  Cardiovascular:     Rate and Rhythm: Normal rate and regular rhythm.     Heart sounds: Normal heart sounds.  Pulmonary:     Effort: Pulmonary effort is normal.     Breath sounds: Normal breath sounds. No wheezing, rhonchi or rales.  Abdominal:     General: Abdomen is flat. Bowel sounds are normal.     Palpations: Abdomen is soft. There is no mass.     Tenderness: There is no abdominal tenderness. There is no right CVA tenderness, left CVA tenderness, guarding or rebound.  Musculoskeletal:        General: No swelling or tenderness.     Cervical back: Neck supple. No tenderness.     Right lower leg: No edema.     Left lower leg: No edema.  Lymphadenopathy:     Cervical: No cervical adenopathy.  Neurological:     General: No focal deficit present.     Mental Status: She is alert and oriented to person, place, and time.  Psychiatric:        Mood and Affect: Mood normal.        Behavior: Behavior normal.     ASSESSMENT/PLAN:   1. Well adult exam 55 yo F w/ significant PMHx of HTN, CVA, prediabetes, COPD, CKD, Bipolar 1 disorder, tobacco use disorder,  sinus tachycardia. She does not have any concerns today. It is this PCP first time meeting patient, discussed having follow up visits to further assess other chronic issues. Patient agreed. As below we will follow up in 2 weeks for BP recheck and other chronic issues that we were unable to assess in this visit.   2. Primary hypertension BP elevated today. Increase to 4m qhs. Follow up in 2 weeks. Baseline BMP today and repeat at follow up.  - Basic Metabolic Panel - losartan (COZAAR) 50 MG tablet; TAKE 1 TABLET BY MOUTH EVERYDAY AT BEDTIME  Dispense: 90 tablet; Refill: 0  3. Prediabetes A1c 5.6 today.   4. Need for vaccination - Tdap (BOOSTRIX) 5-2.5-18.5 LF-MCG/0.5 injection; Inject 0.5 mLs into the muscle once for 1 dose.  Dispense: 0.5 mL; Refill: 0 - Zoster Vaccine Adjuvanted ([facility] injection; Inject 0.5 mLs into the muscle once for 1 dose.  Dispense: 0.5 mL; Refill: 0 - Pneumococcal conjugate vaccine 20-valent (Prevnar 20)    Annual Examination  See AVS for age appropriate recommendations  PHQ score 0, reviewed and discussed.  BP reviewed and not at goal; see above.  Asked about intimate partner violence and resources given as appropriate.   Considered the following  items based upon USPSTF recommendations: Diabetes screening: ordered Screening for elevated cholesterol:  Reviewed recent lipid panel 08/21/20 continue statin at current dose  HIV testing:  Neg 08/16/20 Hepatitis C:  Neg 11/26/19 Hepatitis B: Neg 11/26/19 Syphilis if at high risk:  NR 10/28/20 GC/CT not at high risk and not ordered. Osteoporosis screening considered based upon risk of fracture from [facility] calculator. Major osteoporotic fracture risk is 6.2%. DEXA not ordered.  Reviewed risk factors for latent tuberculosis and not indicated   Discussed family history, BRCA testing declined. Tool used to risk stratify was breast cancer risk assessment tool.  Cervical cancer screening: prior Pap reviewed, repeat due  in this month; upon further review of prior paps hx of HrHPV will plan to discuss at follow up Breast cancer screening:  UTD and recieves them yearly because of hx of mother with breast cancer. 09/05/2020 mammo reviewed and normal Colorectal cancer screening: up to date on screening for CRC. Lung cancer screening: discussed and not indicated 18 pkyr hx .  Vaccinations As above.   Follow up in 1 year for physical or sooner if indicated as above for BP.    Gerlene Fee, [place]

## 2020-11-27 LAB — BASIC METABOLIC PANEL
BUN/Creatinine Ratio: 15 (ref 9–23)
BUN: 17 mg/dL (ref 6–24)
CO2: 25 mmol/L (ref 20–29)
Calcium: 9.7 mg/dL (ref 8.7–10.2)
Chloride: 105 mmol/L (ref 96–106)
Creatinine, Ser: 1.14 mg/dL — ABNORMAL HIGH (ref 0.57–1.00)
Glucose: 107 mg/dL — ABNORMAL HIGH (ref 70–99)
Potassium: 4.5 mmol/L (ref 3.5–5.2)
Sodium: 143 mmol/L (ref 134–144)
eGFR: 56 mL/min/{1.73_m2} — ABNORMAL LOW (ref >59)

## 2020-11-29 ENCOUNTER — Encounter: Payer: Self-pay | Admitting: Family Medicine

## 2020-12-01 ENCOUNTER — Ambulatory Visit (INDEPENDENT_AMBULATORY_CARE_PROVIDER_SITE_OTHER): Payer: Medicare Other | Admitting: Neurology

## 2020-12-01 ENCOUNTER — Encounter: Payer: Self-pay | Admitting: Neurology

## 2020-12-01 VITALS — BP 153/80 | HR 130 | Ht 69.0 in | Wt 175.0 lb

## 2020-12-01 DIAGNOSIS — R7989 Other specified abnormal findings of blood chemistry: Secondary | ICD-10-CM | POA: Diagnosis not present

## 2020-12-01 DIAGNOSIS — N882 Stricture and stenosis of cervix uteri: Secondary | ICD-10-CM

## 2020-12-01 DIAGNOSIS — R6889 Other general symptoms and signs: Secondary | ICD-10-CM | POA: Diagnosis not present

## 2020-12-01 DIAGNOSIS — R930 Abnormal findings on diagnostic imaging of skull and head, not elsewhere classified: Secondary | ICD-10-CM

## 2020-12-01 DIAGNOSIS — I639 Cerebral infarction, unspecified: Secondary | ICD-10-CM

## 2020-12-01 DIAGNOSIS — I63239 Cerebral infarction due to unspecified occlusion or stenosis of unspecified carotid arteries: Secondary | ICD-10-CM | POA: Diagnosis not present

## 2020-12-01 HISTORY — DX: Other specified abnormal findings of blood chemistry: R79.89

## 2020-12-01 NOTE — Progress Notes (Signed)
Chief Complaint  Patient presents with   Follow-up    Rm 15, alone, f/u, discus further testing/MRI       ASSESSMENT AND PLAN  [name] is a 55 y.o. female   Acute left frontal cortical stroke on August 15, 2020 (watershed area of left MCA/ACA) High-grade left internal carotid artery stenosis, status post left endarterectomy on August 21, 2020,  Vascular risk factor of aging, smoking, hypertension, hyperlipidemia  On aspirin 81 mg daily  Significant abnormal MRI of the brain, multiple black holes, thinning of corpus callosum, periventricular lesions, perpendicular to ventricle, suggest the possibility of multiple sclerosis,  Laboratory evaluation to rule out inflammation, infections etiology  May consider lumbar puncture for oligoclonal band, 6 months post her stroke in August 2022.  Cervical stenosis at C 3-4, no cervical cord signal abnormalities  Patient is very concerned about her potential worsening of cervical spinal stenosis, after discussed with patient, will refer her to neurosurgeon,   Elevated B12 level  Patient is not on any B12 supplement, will repeat level again    DIAGNOSTIC DATA (LABS, IMAGING, TESTING) - I reviewed patient records, labs, notes, testing and imaging myself where available.  Laboratory evaluation 2022, lipid panel: LDL 70, CBC, elevated WBC of 18.5, hemoglobin of 12.2, CMP, mild elevated creatinine 1.09, BUN of 21, A1c 6.1 MEDICAL HISTORY:  [name], is a 55 year old female, seen in request by neuro hospitalist Dr. Rosalin Hawking to follow-up on stroke, her primary care physician is [name], [name], initial evaluation was October 28, 2020  I reviewed and summarized the referring note. PMHx. HTN HLD Smoke, 11ppd x 20 years. 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 [name] regularly, on  July 28th, 2022, she received epidural injection for upper back pain,  Next day on August 08, 2020 she woke up from overnight sleep, noticed right hand weakness, has difficulty using the right first and second fingers, Dr. [name] call in prednisone package for her, She was Right hand weakness gradually improved, lasting for about a week, on August 15, 2020, she was talking with her colleague, had a sudden onset of word finding difficulties, which lasted less than 1 minute, she drove herself to [facility] emergency room, worried about a stroke,  She was admitted to hospital for possible TIA, personally MRI of the brain and reviewed with neuro radiologist Dr. Felecia Shelling, positive DWI lesion at left frontal lobe, watershed area, advanced chronic white matter disease, perpendicular to ventricle, morphology is very suggestive of demyelinating disease  CT angiogram of head and neck showed severe stenosis of proximal left internal carotid artery, occlusion of proximal right vertebral artery, no significant right internal carotid artery stenosis  She underwent left carotid endarterectomy on August 21, 2020, recovering well  She now declines focal deficit, there was no history of sudden visual loss, no gait abnormality,  Her maternal aunt suffered multiple sclerosis  She had long known history of cervical spinal stenosis, MRI of cervical spine with without contrast August 16, 2020, age advanced degenerative cervical spondylosis, with multilevel disc disease, facet disease, moderate spinal stenosis, bilateral foraminal narrowing at C3-4, no cord signal changes,   Update December 01, 2020: Patient has a lot of questions about her MRI, laboratory result, worried about her cervical stenosis, came in for early appointment, she denies significant neck pain, no radiating pain to bilateral  shoulder upper extremity  We again personally reviewed MRIs, MRI cervical in August 2022, and in 2021 showed advanced multilevel  degenerative changes, flattening of the ventral thecal sac, obliteration of the ventral CSF space, moderate bilateral foraminal stenosis, left greater than right  MRI of the brain showed DWI lesion at the left frontal lobe from [address] acute left MCA stroke, advanced white matter disease, in orientation highly suggestive of demyelinating disease  Laboratory evaluations showed elevated B12 more than 2000, patient denying that she is on any B12 or multivitamin supplement, normal A1c, copper, ANA, Lyme disease, protein electrophoresis, CBC, mildly decreased vitamin D 26, normal ESR C-reactive protein CPK, RPR  PHYSICAL EXAM:   Vitals:   12/01/20 1301  BP: (!) 153/80  Pulse: (!) 130  Weight: 175 lb (79.4 kg)  Height: 5' 9"  (1.753 m)   Not recorded     Body mass index is 25.84 kg/m.  PHYSICAL EXAMNIATION:  Gen: NAD, conversant, well nourised, well groomed                     Cardiovascular: Regular rate rhythm, no peripheral edema, warm, nontender. Eyes: Conjunctivae clear without exudates or hemorrhage Neck: Supple, no carotid bruits. Pulmonary: Clear to auscultation bilaterally   NEUROLOGICAL EXAM:  MENTAL STATUS: Speech:    Speech is normal; fluent and spontaneous with normal comprehension.  Cognition:     Orientation to time, place and person     Normal recent and remote memory     Normal Attention span and concentration     Normal Language, naming, repeating,spontaneous speech     Fund of knowledge   CRANIAL NERVES: CN II: Visual fields are full to confrontation. Pupils are round equal and briskly reactive to light. CN III, IV, VI: extraocular movement are normal. No ptosis. CN V: Facial sensation is intact to light touch CN VII: Face is symmetric with normal eye closure  CN VIII: Hearing is normal to causal conversation. CN IX, X: Phonation is normal. CN XI: Head turning and shoulder shrug are intact  MOTOR: There is no pronator drift of out-stretched arms.  Muscle bulk and tone are normal. Muscle strength is normal.  REFLEXES: Reflexes are 2+ and symmetric at the biceps, triceps, knees, and ankles. Plantar responses are flexor.  SENSORY: Intact to light touch, pinprick and vibratory sensation are intact in fingers and toes.  COORDINATION: There is no trunk or limb dysmetria noted.  GAIT/STANCE: Posture is normal. Gait is steady with normal steps, base, arm swing, and turning. Heel and toe walking are normal. Tandem gait is normal.  Romberg is absent.  REVIEW OF SYSTEMS:  Full 14 system review of systems performed and notable only for as above All other review of systems were negative.   ALLERGIES: Allergies  Allergen Reactions   Iohexol Hives    Hives 06/06/04, needs pre meds    Morphine And Related Hives and Itching    Denies Airway involvement   Sulfa Antibiotics Itching and Rash    Reported with TMP/ SMX  Denies Airway involvement    HOME MEDICATIONS: Current Outpatient Medications  Medication Sig Dispense Refill   albuterol (VENTOLIN HFA) 108 (90 Base) MCG/ACT inhaler TAKE 2 PUFFS BY MOUTH EVERY 6 HOURS AS NEEDED FOR WHEEZE 8 g 5   aspirin EC 81 MG EC tablet Take 1 tablet (81 mg total) by mouth daily. Swallow whole. 30 tablet 11   atorvastatin (LIPITOR) 80 MG tablet TAKE 1 TABLET BY MOUTH EVERY DAY  30 tablet 1   lamoTRIgine (LAMICTAL) 200 MG tablet TAKE 1 TABLET BY MOUTH EVERY DAY 90 tablet 2   losartan (COZAAR) 50 MG tablet TAKE 1 TABLET BY MOUTH EVERYDAY AT BEDTIME 90 tablet 0   nicotine polacrilex (NICORETTE) 2 MG gum Take 1 each (2 mg total) by mouth as needed for smoking cessation. 100 tablet 0   traZODone (DESYREL) 100 MG tablet TAKE 1 TABLET BY MOUTH EVERYDAY AT BEDTIME 90 tablet 2   No current facility-administered medications for this visit.    PAST MEDICAL HISTORY: Past Medical History:  Diagnosis Date   Bipolar 1 disorder ([place])    Chronic kidney disease    donated left kidney   Hypertension    Pneumonia  2010   had 2 episodes/ was sent home on O2   Pulmonary embolism ([place]) 1994   Only 1 clot, not on lifelong anticoagulation    PAST SURGICAL HISTORY: Past Surgical History:  Procedure Laterality Date   APPENDECTOMY  2001   ENDARTERECTOMY Left 08/20/2020   Procedure: LEFT CAROTID ARTERY ENDARTERECTOMY;  Surgeon: Cherre Robins, MD;  Location: [place] OR;  Service: Vascular;  Laterality: Left;   Endometriosis s/p left fallopian tube removal.  2001   [place]   Donated kidney to sister   PATCH ANGIOPLASTY Left 08/20/2020   Procedure: PATCH ANGIOPLASTY WITH 1x6 Weyman Pedro;  Surgeon: Cherre Robins, MD;  Location: [facility];  Service: Vascular;  Laterality: Left;   TONSILLECTOMY      FAMILY HISTORY: Family History  Problem Relation Age of Onset   Breast cancer Mother    CVA Mother    Diabetes Sister    Kidney disease Sister    Breast cancer Maternal Grandmother    Colon cancer Paternal Grandmother    Breast cancer Maternal Aunt     SOCIAL HISTORY: Social History   Socioeconomic History   Marital status: Divorced    Spouse name: Not on file   Number of children: Not on file   Years of education: Not on file   Highest education level: Not on file  Occupational History   Not on file  Tobacco Use   Smoking status: Every Day    Packs/day: 0.50    Years: 36.00    Pack years: 18.00    Types: Cigarettes   Smokeless tobacco: Never  Vaping Use   Vaping Use: Some days  Substance and Sexual Activity   Alcohol use: Yes    Comment: occassionally   Drug use: No   Sexual activity: Yes    Birth control/protection: Condom    Comment: Partner with Vasectomy  Other Topics Concern   Not on file  Social History Narrative   Not on file   Social Determinants of Health   Financial Resource Strain: Not on file  Food Insecurity: Not on file  Transportation Needs: Not on file  Physical Activity: Not on file  Stress: Not on file  Social Connections: Not on file   Intimate Partner Violence: Not on file    Total time spent reviewing the chart, obtaining history, examined patient, ordering tests, documentation, consultations and family, care coordination was 21 minutes     Marcial Pacas, M.D. Ph.D.  [facility] [address] [place], [place] [address] Ph: [phone] Fax: [phone]  CC:  Gerlene Fee, [place],  [place] [address]  Gerlene Fee, DO

## 2020-12-02 ENCOUNTER — Telehealth: Payer: Self-pay | Admitting: Neurology

## 2020-12-02 NOTE — Telephone Encounter (Signed)
Referral sent to [facility]. Phone: [phone].

## 2020-12-08 DIAGNOSIS — M5136 Other intervertebral disc degeneration, lumbar region: Secondary | ICD-10-CM | POA: Diagnosis not present

## 2020-12-08 DIAGNOSIS — M5412 Radiculopathy, cervical region: Secondary | ICD-10-CM | POA: Diagnosis not present

## 2020-12-08 DIAGNOSIS — M5416 Radiculopathy, lumbar region: Secondary | ICD-10-CM | POA: Diagnosis not present

## 2020-12-08 DIAGNOSIS — M4802 Spinal stenosis, cervical region: Secondary | ICD-10-CM | POA: Diagnosis not present

## 2020-12-14 ENCOUNTER — Other Ambulatory Visit: Payer: Self-pay | Admitting: Family Medicine

## 2020-12-18 ENCOUNTER — Other Ambulatory Visit: Payer: Self-pay | Admitting: Family Medicine

## 2020-12-18 DIAGNOSIS — M4802 Spinal stenosis, cervical region: Secondary | ICD-10-CM | POA: Diagnosis not present

## 2020-12-20 DIAGNOSIS — M5136 Other intervertebral disc degeneration, lumbar region: Secondary | ICD-10-CM | POA: Diagnosis not present

## 2020-12-20 DIAGNOSIS — M791 Myalgia, unspecified site: Secondary | ICD-10-CM | POA: Diagnosis not present

## 2020-12-20 DIAGNOSIS — M7918 Myalgia, other site: Secondary | ICD-10-CM | POA: Insufficient documentation

## 2020-12-20 DIAGNOSIS — M5416 Radiculopathy, lumbar region: Secondary | ICD-10-CM | POA: Diagnosis not present

## 2020-12-20 DIAGNOSIS — M5412 Radiculopathy, cervical region: Secondary | ICD-10-CM | POA: Diagnosis not present

## 2021-01-08 ENCOUNTER — Telehealth (HOSPITAL_BASED_OUTPATIENT_CLINIC_OR_DEPARTMENT_OTHER): Payer: Self-pay

## 2021-01-08 ENCOUNTER — Telehealth: Payer: Self-pay | Admitting: *Deleted

## 2021-01-08 NOTE — Telephone Encounter (Signed)
Received surgical clearance request for C3-4 anterior cervical fusion (under general anesthesia). This will be performed by Dr. [name] at [facility].  History of CVA. Dr. Krista Blue reviewed her chart and completed the form. Okay to proceed with surgery. Hold aspirin 5 days prior to procedure.   Faxed back to that office at [phone]. Confirmation received.

## 2021-01-08 NOTE — Telephone Encounter (Signed)
Left message for the pt to call the office for an appt for pre op clearance. Pt can see Dr. Buford Dresser or APP.

## 2021-01-08 NOTE — Telephone Encounter (Signed)
° °  Name: [name]  DOB: 1965-12-14  MRN: [id number]  Primary Cardiologist: Buford Dresser, MD  Chart reviewed as part of pre-operative protocol coverage. Because of [name]'s past medical history and time since last visit, she will require a follow-up visit in order to better assess preoperative cardiovascular risk.  Pre-op covering staff:  -[facility] staff, please also notify the requesting surgeon's office to contact the patient's PCP or neurology for recommendations regarding aspirin as this was a started for TIA/CVA and not managed by our office.  - Please schedule appointment and call patient to inform them. If patient already had an upcoming appointment within acceptable timeframe, please add "pre-op clearance" to the appointment notes so provider is aware. - Please contact requesting surgeon's office via preferred method (i.e, phone, fax) to inform them of need for appointment prior to surgery.  If applicable, this message will also be routed to pharmacy pool and/or primary cardiologist for input on holding anticoagulant/antiplatelet agent as requested below so that this information is available to the clearing provider at time of patient's appointment.   Christell Faith, PA-C  01/08/2021, 2:30 PM

## 2021-01-08 NOTE — Telephone Encounter (Signed)
° °  Pre-operative Risk Assessment    Patient Name: [name]  DOB: 1965/07/25 MRN: [id number]      Request for Surgical Clearance    Procedure:   C3-4 Anterior Cervical Fusion  Date of Surgery:  Clearance TBD                                 Surgeon:  Dr. [name] Surgeon's Group or Practice Name:  [facility] Phone number:  [phone] Fax number:  [phone] ATTN: Lorriane Shire   Type of Clearance Requested:   - Medical  - Pharmacy:  Hold Aspirin     Type of Anesthesia:  General    Additional requests/questions:    Claudine Mouton   01/08/2021, 2:17 PM

## 2021-01-11 ENCOUNTER — Other Ambulatory Visit: Payer: Self-pay | Admitting: Family Medicine

## 2021-01-13 DIAGNOSIS — I6522 Occlusion and stenosis of left carotid artery: Secondary | ICD-10-CM | POA: Insufficient documentation

## 2021-01-13 NOTE — Telephone Encounter (Signed)
Pt has been scheduled to see Dr. Harrell Gave, 01/30/21 and clearance will be addressed at that time.  Will route back to the requesting surgeon's office to make them aware.

## 2021-01-20 ENCOUNTER — Other Ambulatory Visit: Payer: Self-pay

## 2021-01-20 ENCOUNTER — Encounter (HOSPITAL_BASED_OUTPATIENT_CLINIC_OR_DEPARTMENT_OTHER): Payer: Self-pay | Admitting: Cardiology

## 2021-01-20 ENCOUNTER — Ambulatory Visit (HOSPITAL_BASED_OUTPATIENT_CLINIC_OR_DEPARTMENT_OTHER): Payer: Medicare PPO | Admitting: Cardiology

## 2021-01-20 VITALS — BP 140/84 | HR 113 | Resp 20 | Ht 69.0 in | Wt 173.0 lb

## 2021-01-20 DIAGNOSIS — I10 Essential (primary) hypertension: Secondary | ICD-10-CM | POA: Diagnosis not present

## 2021-01-20 DIAGNOSIS — Z01810 Encounter for preprocedural cardiovascular examination: Secondary | ICD-10-CM | POA: Diagnosis not present

## 2021-01-20 DIAGNOSIS — Z7189 Other specified counseling: Secondary | ICD-10-CM

## 2021-01-20 DIAGNOSIS — R000 Tachycardia, unspecified: Secondary | ICD-10-CM | POA: Diagnosis not present

## 2021-01-20 NOTE — Patient Instructions (Signed)
Medication Instructions:  Your physician recommends that you continue on your current medications as directed. Please refer to the Current Medication list given to you today.  *If you need a refill on your cardiac medications before your next appointment, please call your pharmacy*   Lab Work: None If you have labs (blood work) drawn today and your tests are completely normal, you will receive your results only by: [facility] (if you have MyChart) OR A paper copy in the mail If you have any lab test that is abnormal or we need to change your treatment, we will call you to review the results.    Follow-Up: At [facility], you and your health needs are our priority.  As part of our continuing mission to provide you with exceptional heart care, we have created designated Provider Care Teams.  These Care Teams include your primary Cardiologist (physician) and Advanced Practice Providers (APPs -  Physician Assistants and Nurse Practitioners) who all work together to provide you with the care you need, when you need it.   Your next appointment:   1 year(s)  The format for your next appointment:   In Person  Provider:   Buford Dresser, MD{

## 2021-01-20 NOTE — Progress Notes (Incomplete)
Cardiology Office Note:    Date:  01/20/2021   ID:  [name], DOB Apr 20, 1965, MRN [id number]  PCP:  Gerlene Fee, DO  Cardiologist:  Buford Dresser, MD PhD  Referring MD: Gerlene Fee, DO   CC: follow up  History of Present Illness:    [name] is a 56 y.o. female with a hx of hypertension, sinus tachycardia, history of PFO (per PCP notes), remote PE. Initial consult was 12/07/2017.  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  -Long history of sinus tachycardia, asymptomatic  Today: She is in need of pre-op clearance for a C3-4 anterior cervical fusion not yet scheduled, request to hold aspirin. Recently she has not had any chest pain. She endorses being able to climb stairs and walk without exertional symptoms. Currently she is not experiencing neck pain.  Overall, she was feeling good until 08/2020. On 7/28 she received a spinal nerve block. The following morning she was unable to use her right index finger or thumb. She started Prednisone, which did not help. A few days later she suddenly lost the ability to speak for 40 minutes. She was admitted 08/15/2020 for a TIA.  She underwent left carotid artery endarterectomy 08/20/2020 performed by Dr. Stanford Breed.   She denies any palpitations, or shortness of breath. No lightheadedness, headaches, syncope, orthopnea, PND, or lower extremity edema.   Past Medical History:  Diagnosis Date   Bipolar 1 disorder ([place])    Chronic kidney disease    donated left kidney   Hypertension    Pneumonia 2010   had 2 episodes/ was sent home on O2   Pulmonary embolism ([place]) 1994   Only 1 clot, not on lifelong anticoagulation    Past Surgical History:  Procedure Laterality Date   APPENDECTOMY  2001   ENDARTERECTOMY Left 08/20/2020   Procedure: LEFT CAROTID ARTERY ENDARTERECTOMY;  Surgeon: Cherre Robins, MD;  Location: [facility];  Service: Vascular;  Laterality: Left;   Endometriosis  s/p left fallopian tube removal.  2001   [place]   Donated kidney to sister   [facility] ANGIOPLASTY Left 08/20/2020   Procedure: PATCH ANGIOPLASTY WITH 1x6 Weyman Pedro;  Surgeon: Cherre Robins, MD;  Location: [facility] OR;  Service: Vascular;  Laterality: Left;   TONSILLECTOMY      Current Medications: Current Outpatient Medications on File Prior to Visit  Medication Sig   albuterol (VENTOLIN HFA) 108 (90 Base) MCG/ACT inhaler TAKE 2 PUFFS BY MOUTH EVERY 6 HOURS AS NEEDED FOR WHEEZE   aspirin EC 81 MG EC tablet Take 1 tablet (81 mg total) by mouth daily. Swallow whole.   atorvastatin (LIPITOR) 80 MG tablet TAKE 1 TABLET BY MOUTH EVERY DAY   lamoTRIgine (LAMICTAL) 200 MG tablet TAKE 1 TABLET BY MOUTH EVERY DAY   losartan (COZAAR) 50 MG tablet TAKE 1 TABLET BY MOUTH EVERYDAY AT BEDTIME   nicotine polacrilex (NICORETTE) 2 MG gum Take 1 each (2 mg total) by mouth as needed for smoking cessation.   traZODone (DESYREL) 100 MG tablet TAKE 1 TABLET BY MOUTH EVERYDAY AT BEDTIME   No current facility-administered medications on file prior to visit.     Allergies:   Iohexol, Morphine and related, and Sulfa antibiotics   Social History   Tobacco Use   Smoking status: Every Day    Packs/day: 0.50    Years: 36.00    Pack years: 18.00    Types: Cigarettes   Smokeless  tobacco: Never  Vaping Use   Vaping Use: Some days  Substance Use Topics   Alcohol use: Yes    Comment: occassionally   Drug use: No    Family History: The patient's family history includes Breast cancer in her maternal aunt, maternal grandmother, and mother; CVA in her mother; Colon cancer in her paternal grandmother; Diabetes in her sister; Kidney disease in her sister.  ROS:   Please see the history of present illness.   Additional pertinent ROS otherwise unremarkable.   EKGs/Labs/Other Studies Reviewed:    The following studies were reviewed today:  Bilateral Carotid Duplex 09/23/2020: Summary:   Right Carotid: Velocities in the right ICA are consistent with a 1-39%  stenosis.   Left Carotid: Velocities in the left ICA are consistent with a 1-39%  stenosis. Patent endarterectomy.   Vertebrals:  Bilateral vertebral arteries demonstrate antegrade flow.  Subclavians: Normal flow hemodynamics were seen in bilateral subclavian arteries.   Echo 08/16/2020:  1. Left ventricular ejection fraction, by estimation, is 55 to 60%. The  left ventricle has normal function. The left ventricle has no regional  wall motion abnormalities. There is mild left ventricular hypertrophy.  Left ventricular diastolic parameters  are indeterminate.   2. Right ventricular systolic function is normal. The right ventricular  size is normal.   3. The mitral valve is normal in structure. No evidence of mitral valve  regurgitation.   4. The aortic valve is normal in structure. Aortic valve regurgitation is  not visualized.   5. The inferior vena cava is normal in size with greater than 50%  respiratory variability, suggesting right atrial pressure of 3 mmHg.   Echo 12/07/2016 Study Conclusions  - Left ventricle: The cavity size was normal. Wall thickness was   normal. The estimated ejection fraction was 60%. Wall motion was   normal; there were no regional wall motion abnormalities. Doppler   parameters are consistent with abnormal left ventricular   relaxation (grade 1 diastolic dysfunction). - Aortic valve: There was no stenosis. - Mitral valve: There was trivial regurgitation. - Right ventricle: The cavity size was normal. Systolic function   was normal. - Pulmonary arteries: No complete TR doppler jet so unable to   estimate PA systolic pressure. - Inferior vena cava: The vessel was normal in size. The   respirophasic diameter changes were in the normal range (>= 50%),   consistent with normal central venous pressure.   Impressions:  - Normal LV size with EF 60%. Normal RV size and systolic  function.   No significant valvular abnormalities.  EKG:  EKG is personally reviewed.   01/20/2021: EKG was not ordered. 09/13/2019: sinus rhythm at 100 bpm  Recent Labs: 08/21/2020: ALT 16; Magnesium 1.7 10/28/2020: Hemoglobin 13.5; Platelets 290 11/26/2020: BUN 17; Creatinine, Ser 1.14; Potassium 4.5; Sodium 143   Recent Lipid Panel    Component Value Date/Time   CHOL 149 08/21/2020 0520   CHOL 213 (H) 11/26/2019 1117   TRIG 106 08/21/2020 0520   HDL 58 08/21/2020 0520   HDL 63 11/26/2019 1117   CHOLHDL 2.6 08/21/2020 0520   VLDL 21 08/21/2020 0520   LDLCALC 70 08/21/2020 0520   LDLCALC 128 (H) 11/26/2019 1117    Physical Exam:    VS:  BP 140/84 (BP Location: Left Arm, Patient Position: Sitting, Cuff Size: Normal)    Pulse (!) 113    Resp 20    Ht 5\' 9"  (1.753 m)    Wt 173 lb (78.5 kg)  SpO2 99%    BMI 25.55 kg/m     Wt Readings from Last 3 Encounters:  01/20/21 173 lb (78.5 kg)  12/01/20 175 lb (79.4 kg)  11/26/20 175 lb 12.8 oz (79.7 kg)    GEN: Well nourished, well developed in no acute distress HEENT: Normal, moist mucous membranes NECK: No JVD CARDIAC: regular rhythm, normal S1 and S2, no rubs or gallops. No murmur. VASCULAR: Radial and DP pulses 2+ bilaterally. No carotid bruits RESPIRATORY:  Clear to auscultation without rales, wheezing or rhonchi  ABDOMEN: Soft, non-tender, non-distended MUSCULOSKELETAL:  Ambulates independently SKIN: Warm and dry, no edema NEUROLOGIC:  Alert and oriented x 3. No focal neuro deficits noted. PSYCHIATRIC:  Normal affect   ASSESSMENT:    No diagnosis found.  PLAN:    Sinus tachycardia: has improved with increasing activity and weight loss -right at 100 bpm today by ECG  Hypertension: Has been well controlled. Slightly above goal of <130/80 today. -continue current meds.  -encouraged continued lifestyle changes, focused on activity and healthy eating  Tobacco abuse, counseled to quit: The patient was counseled on  tobacco cessation today for 4 minutes.  Counseling included reviewing the risks of smoking tobacco products, how it impacts the patient's current medical diagnoses and different strategies for quitting.  Pharmacotherapy to aid in tobacco cessation was not prescribed today.  Primary prevention and heath counseling: -recommend heart healthy/Mediterranean diet, with whole grains, fruits, vegetable, fish, lean meats, nuts, and olive oil. Limit salt. -recommend moderate walking, 3-5 times/week for 30-50 minutes each session. Aim for at least 150 minutes.week. Goal should be pace of 3 miles/hours, or walking 1.5 miles in 30 minutes -recommend avoidance of tobacco products. Avoid excess alcohol. -Additional risk factor control:  -Diabetes: A1c is not recently available, no diabetes diagnosis  -Lipids: LDL 118 11/2017  -Blood pressure control: as above  -Weight: BMI 22 -ASCVD risk score: The ASCVD Risk score (Arnett DK, et al., 2019) failed to calculate for the following reasons:   The patient has a prior MI or stroke diagnosis   Plan for follow up: 1 year or sooner PRN  Buford Dresser, MD, PhD [place]   [facility]   Medication Adjustments/Labs and Tests Ordered: Current medicines are reviewed at length with the patient today.  Concerns regarding medicines are outlined above.   No orders of the defined types were placed in this encounter.  No orders of the defined types were placed in this encounter.  Patient Instructions  Medication Instructions:  Your physician recommends that you continue on your current medications as directed. Please refer to the Current Medication list given to you today.  *If you need a refill on your cardiac medications before your next appointment, please call your pharmacy*   Lab Work: None If you have labs (blood work) drawn today and your tests are completely normal, you will receive your results only by: [place] (if you have MyChart)  OR A paper copy in the mail If you have any lab test that is abnormal or we need to change your treatment, we will call you to review the results.    Follow-Up: At [facility], you and your health needs are our priority.  As part of our continuing mission to provide you with exceptional heart care, we have created designated Provider Care Teams.  These Care Teams include your primary Cardiologist (physician) and Advanced Practice Providers (APPs -  Physician Assistants and Nurse Practitioners) who all work together to provide you with  the care you need, when you need it.   Your next appointment:   1 year(s)  The format for your next appointment:   {F/U Visit Format          :[id number]}  Provider:   {Providers/Teams        :[id number]}{ If MD is not listed, click here to update    :1}    Other Instructions ***    I,Mathew Stumpf,acting as a scribe for Buford Dresser, MD.,have documented all relevant documentation on the behalf of Buford Dresser, MD,as directed by  Buford Dresser, MD while in the presence of Buford Dresser, MD.  ***  Signed, Buford Dresser, MD PhD 01/20/2021     [place]

## 2021-01-30 ENCOUNTER — Telehealth (HOSPITAL_BASED_OUTPATIENT_CLINIC_OR_DEPARTMENT_OTHER): Payer: Self-pay | Admitting: Cardiology

## 2021-01-30 ENCOUNTER — Ambulatory Visit (HOSPITAL_BASED_OUTPATIENT_CLINIC_OR_DEPARTMENT_OTHER): Payer: Medicare Other | Admitting: Cardiology

## 2021-01-30 NOTE — Telephone Encounter (Signed)
° °  Pre-operative Risk Assessment    Patient Name: [name]  DOB: October 03, 1965 MRN: [id number]      Request for Surgical Clearance    Procedure:   C3-4 Anterior Cervical Fusion  Date of Surgery:  Clearance TBD                             {     Surgeon:  Eustace Moore, MD Surgeon's Group or Practice Name:  [facility] Phone number: [phone] Fax number:  [phone]   Type of Clearance Requested:   - Medical  - Pharmacy:  Hold Aspirin and how long   Type of Anesthesia:  General    Additional requests/questions:   None  Barbaraann Faster   01/30/2021, 4:37 PM

## 2021-02-02 ENCOUNTER — Other Ambulatory Visit: Payer: Self-pay | Admitting: Neurological Surgery

## 2021-02-02 ENCOUNTER — Other Ambulatory Visit: Payer: Self-pay | Admitting: Family Medicine

## 2021-02-02 NOTE — Telephone Encounter (Signed)
[name] 56 year old female is requesting preoperative cardiac evaluation for C3-4 anterior cervical fusion.  She was last seen in the clinic on 01/20/2021.  During that time she reported she was doing fairly well.  She denied palpitations, shortness of breath, lightheadedness, headaches, syncope, orthopnea, PND, and lower extremity swelling.  She reported that she was feeling overall well until 8/22.  She had received a nerve block on 7/28.  Following her block she was unable to use her right index finger and thumb.  She was started on prednisone.  A few days later she suddenly lost the ability to speak for 40 minutes.  She was admitted 08/15/2020 for TIA.  She underwent carotid endarterectomy 08/20/2020.  Her PMH includes sinus tachycardia, solitary kidney, bipolar disorder, and hypertension.   May her aspirin be held prior to her procedure?  Please direct your response to CV DIV preop pool.  Thank you for your help.   Jossie Ng. Kimberly Bruntz NP-C    02/02/2021, 9:48 AM [place] [address] Office [phone] Fax [phone]

## 2021-02-03 ENCOUNTER — Telehealth: Payer: Self-pay

## 2021-02-03 NOTE — Telephone Encounter (Signed)
Patient calls nurse line wanting to update PCP on upcoming surgery scheduled for 2/15.  Patient reports she was told to hold her aspirin starting 2/8 and can resume after surgery.

## 2021-02-04 ENCOUNTER — Other Ambulatory Visit: Payer: Self-pay | Admitting: Family Medicine

## 2021-02-10 NOTE — Telephone Encounter (Signed)
° °  Primary Cardiologist: Buford Dresser, MD  Chart reviewed as part of pre-operative protocol coverage. Given past medical history and time since last visit, based on ACC/AHA guidelines, [name] would be at acceptable risk for the planned procedure without further cardiovascular testing.   Per Dr. Harrell Gave " Her surgery is scheduled for 02/26/20. Typically as the carotid surgery was less than 6 mos ago, this should be cleared by vascular surgery. From a cardiac standpoint it is reasonable to hold aspirin for as short of a duration as needed by the surgeons."   I will route this recommendation to the requesting party via [place] fax function and remove from pre-op pool.  Please call with questions.   Jossie Ng. Kacey Vicuna NP-C    02/10/2021, 9:16 AM [place] [address] Office [phone] Fax [phone]

## 2021-02-10 NOTE — Telephone Encounter (Signed)
Her surgery is scheduled for 02/26/20. Typically as the carotid surgery was less than 6 mos ago, this should be cleared by vascular surgery. From a cardiac standpoint it is reasonable to hold aspirin for as short of a duration as needed by the surgeons.

## 2021-02-20 NOTE — Pre-Procedure Instructions (Signed)
Surgical Instructions    Your procedure is scheduled on Wednesday, February 25, 2021 at 8:30 AM.  Report to [facility] Main Entrance "A" at 6:30 A.M., then check in with the Admitting office.  Call this number if you have problems the morning of surgery:  [phone]   If you have any questions prior to your surgery date call [phone]: Open Monday-Friday 8am-4pm    Remember:  Do not eat or drink after midnight the night before your surgery    Take these medicines the morning of surgery with A SIP OF WATER:  atorvastatin (LIPITOR) Polyethyl Glycol-Propyl Glycol (SYSTANE OP) albuterol (VENTOLIN HFA) - if needed  Please bring all inhalers with you the day of surgery.    As of today, STOP taking any Aspirin (unless otherwise instructed by your surgeon) Aleve, Naproxen, Ibuprofen, Motrin, Advil, Goody's, BC's, all herbal medications, fish oil, and all vitamins.                     Do NOT Smoke (Tobacco/Vaping) for 24 hours prior to your procedure.  If you use a CPAP at night, you may bring your mask/headgear for your overnight stay.   Contacts, glasses, piercing's, hearing aid's, dentures or partials may not be worn into surgery, please bring cases for these belongings.    For patients admitted to the hospital, discharge time will be determined by your treatment team.   Patients discharged the day of surgery will not be allowed to drive home, and someone needs to stay with them for 24 hours.  NO VISITORS WILL BE ALLOWED IN PRE-OP WHERE PATIENTS ARE PREPPED FOR SURGERY.  ONLY 1 SUPPORT PERSON MAY BE PRESENT IN THE WAITING ROOM WHILE YOU ARE IN SURGERY.  IF YOU ARE TO BE ADMITTED, ONCE YOU ARE IN YOUR ROOM YOU WILL BE ALLOWED TWO (2) VISITORS. (1) VISITOR MAY STAY OVERNIGHT BUT MUST ARRIVE TO THE ROOM BY 8pm.  Minor children may have two parents present. Special consideration for safety and communication needs will be reviewed on a case by case basis.   Special instructions:    [place]- Preparing For Surgery  Before surgery, you can play an important role. Because skin is not sterile, your skin needs to be as free of germs as possible. You can reduce the number of germs on your skin by washing with CHG (chlorahexidine gluconate) Soap before surgery.  CHG is an antiseptic cleaner which kills germs and bonds with the skin to continue killing germs even after washing.    Oral Hygiene is also important to reduce your risk of infection.  Remember - BRUSH YOUR TEETH THE MORNING OF SURGERY WITH YOUR REGULAR TOOTHPASTE  Please do not use if you have an allergy to CHG or antibacterial soaps. If your skin becomes reddened/irritated stop using the CHG.  Do not shave (including legs and underarms) for at least 48 hours prior to first CHG shower. It is OK to shave your face.  Please follow these instructions carefully.   Shower the NIGHT BEFORE SURGERY and the MORNING OF SURGERY  If you chose to wash your hair, wash your hair first as usual with your normal shampoo.  After you shampoo, rinse your hair and body thoroughly to remove the shampoo.  Use CHG Soap as you would any other liquid soap. You can apply CHG directly to the skin and wash gently with a scrungie or a clean washcloth.   Apply the CHG Soap to your body ONLY FROM THE  NECK DOWN.  Do not use on open wounds or open sores. Avoid contact with your eyes, ears, mouth and genitals (private parts). Wash Face and genitals (private parts)  with your normal soap.   Wash thoroughly, paying special attention to the area where your surgery will be performed.  Thoroughly rinse your body with warm water from the neck down.  DO NOT shower/wash with your normal soap after using and rinsing off the CHG Soap.  Pat yourself dry with a CLEAN TOWEL.  Wear CLEAN PAJAMAS to bed the night before surgery  Place CLEAN SHEETS on your bed the night before your surgery  DO NOT SLEEP WITH PETS.   Day of Surgery: Shower with CHG  soap. Do not wear jewelry, make up, nail polish, gel polish, artificial nails, or any other type of covering on natural nails including finger and toenails. If patients have artificial nails, gel coating, etc. that need to be removed by a nail salon please have this removed prior to surgery. Surgery may need to be canceled/delayed if the surgeon/anesthesiologist feels like the patient is unable to be adequately monitored. Do not wear lotions, powders, perfumes, or deodorant. Do not shave 48 hours prior to surgery. Do not bring valuables to the hospital. [facility] is not responsible for any belongings or valuables. Wear Clean/Comfortable clothing the morning of surgery Remember to brush your teeth WITH YOUR REGULAR TOOTHPASTE.   Please read over the following fact sheets that you were given.   3 days prior to your procedure or After your COVID test   You are not required to quarantine however you are required to wear a well-fitting mask when you are out and around people not in your household. If your mask becomes wet or soiled, replace with a new one.   Wash your hands often with soap and water for 20 seconds or clean your hands with an alcohol-based hand sanitizer that contains at least 60% alcohol.   Do not share personal items.   Notify your provider:  o if you are in close contact with someone who has COVID  o or if you develop a fever of 100.4 or greater, sneezing, cough, sore throat, shortness of breath or body aches.

## 2021-02-21 ENCOUNTER — Other Ambulatory Visit: Payer: Self-pay | Admitting: Family Medicine

## 2021-02-21 DIAGNOSIS — I10 Essential (primary) hypertension: Secondary | ICD-10-CM

## 2021-02-23 ENCOUNTER — Encounter (HOSPITAL_COMMUNITY)
Admission: RE | Admit: 2021-02-23 | Discharge: 2021-02-23 | Disposition: A | Discharge status: Home / Self Care | Payer: Medicare PPO | Source: Ambulatory Visit | Attending: Neurological Surgery | Admitting: Neurological Surgery

## 2021-02-23 ENCOUNTER — Other Ambulatory Visit: Payer: Self-pay

## 2021-02-23 ENCOUNTER — Encounter (HOSPITAL_COMMUNITY): Payer: Self-pay

## 2021-02-23 VITALS — BP 149/79 | HR 106 | Temp 97.8°F | Resp 18 | Ht 69.0 in | Wt 179.8 lb

## 2021-02-23 DIAGNOSIS — Z01818 Encounter for other preprocedural examination: Secondary | ICD-10-CM

## 2021-02-23 DIAGNOSIS — Z01812 Encounter for preprocedural laboratory examination: Secondary | ICD-10-CM | POA: Diagnosis present

## 2021-02-23 DIAGNOSIS — Z20822 Contact with and (suspected) exposure to covid-19: Secondary | ICD-10-CM | POA: Insufficient documentation

## 2021-02-23 HISTORY — DX: Cardiac murmur, unspecified: R01.1

## 2021-02-23 HISTORY — DX: Cerebral infarction, unspecified: I63.9

## 2021-02-23 HISTORY — DX: Unspecified osteoarthritis, unspecified site: M19.90

## 2021-02-23 LAB — BASIC METABOLIC PANEL
Anion gap: 11 (ref 5–15)
BUN: 15 mg/dL (ref 6–20)
CO2: 26 mmol/L (ref 22–32)
Calcium: 9.2 mg/dL (ref 8.9–10.3)
Chloride: 104 mmol/L (ref 98–111)
Creatinine, Ser: 1.29 mg/dL — ABNORMAL HIGH (ref 0.44–1.00)
GFR, Estimated: 49 mL/min — ABNORMAL LOW (ref >60)
Glucose, Bld: 120 mg/dL — ABNORMAL HIGH (ref 70–99)
Potassium: 3.5 mmol/L (ref 3.5–5.1)
Sodium: 141 mmol/L (ref 135–145)

## 2021-02-23 LAB — SURGICAL PCR SCREEN
MRSA, PCR: NEGATIVE
Staphylococcus aureus: NEGATIVE

## 2021-02-23 LAB — CBC
HCT: 42.3 % (ref 36.0–46.0)
Hemoglobin: 13.4 g/dL (ref 12.0–15.0)
MCH: 26.7 pg (ref 26.0–34.0)
MCHC: 31.7 g/dL (ref 30.0–36.0)
MCV: 84.4 fL (ref 80.0–100.0)
Platelets: 289 10*3/uL (ref 150–400)
RBC: 5.01 MIL/uL (ref 3.87–5.11)
RDW: 14.6 % (ref 11.5–15.5)
WBC: 10.3 10*3/uL (ref 4.0–10.5)
nRBC: 0 % (ref 0.0–0.2)

## 2021-02-23 LAB — PROTIME-INR
INR: 0.9 (ref 0.8–1.2)
Prothrombin Time: 12.1 seconds (ref 11.4–15.2)

## 2021-02-23 NOTE — Progress Notes (Signed)
PCP: Gerlene Fee, DO Cardiologist: Buford Dresser, MD  EKG: 08/18/20 CXR: na ECHO: 08/17/20 Stress Test: denies Cardiac Cath: denies  Fasting Blood Sugar- na Checks Blood Sugar_na__ times a day  ASA: Last dose ASA 81 mg 02/18/21 Blood Thinner: No  OSA/CPAP: No  Covid test 02/23/21 at PAT  Anesthesia Review: No  Patient denies shortness of breath, fever, cough, and chest pain at PAT appointment.  Patient verbalized understanding of instructions provided today at the PAT appointment.  Patient asked to review instructions at home and day of surgery.

## 2021-02-24 LAB — SARS CORONAVIRUS 2 (TAT 6-24 HRS): SARS Coronavirus 2: NEGATIVE

## 2021-02-24 NOTE — Anesthesia Preprocedure Evaluation (Addendum)
Anesthesia Evaluation  Patient identified by MRN, date of birth, ID band Patient awake    Reviewed: Allergy & Precautions, NPO status , Patient's Chart, lab work & pertinent test results  Airway Mallampati: III  TM Distance: >3 FB Neck ROM: Full    Dental  (+) Teeth Intact, Dental Advisory Given   Pulmonary COPD,  COPD inhaler, Current Smoker and Patient abstained from smoking., PE (1994)   Pulmonary exam normal breath sounds clear to auscultation       Cardiovascular hypertension, Pt. on medications (-) anginaNormal cardiovascular exam Rhythm:Regular Rate:Normal     Neuro/Psych PSYCHIATRIC DISORDERS Bipolar Disorder Cervical stenosis  TIA   GI/Hepatic negative GI ROS, Neg liver ROS,   Endo/Other  negative endocrine ROS  Renal/GU Renal InsufficiencyRenal disease     Musculoskeletal  (+) Arthritis ,   Abdominal   Peds  Hematology negative hematology ROS (+)   Anesthesia Other Findings   Reproductive/Obstetrics                            Anesthesia Physical Anesthesia Plan  ASA: 3  Anesthesia Plan: General   Post-op Pain Management: Tylenol PO (pre-op)*   Induction: Intravenous  PONV Risk Score and Plan: 2 and Midazolam, Dexamethasone and Ondansetron  Airway Management Planned: Oral ETT and Video Laryngoscope Planned  Additional Equipment:   Intra-op Plan:   Post-operative Plan: Extubation in OR  Informed Consent: I have reviewed the patients History and Physical, chart, labs and discussed the procedure including the risks, benefits and alternatives for the proposed anesthesia with the patient or authorized representative who has indicated his/her understanding and acceptance.     Dental advisory given  Plan Discussed with: CRNA  Anesthesia Plan Comments:        Anesthesia Quick Evaluation

## 2021-02-25 ENCOUNTER — Other Ambulatory Visit: Payer: Self-pay

## 2021-02-25 ENCOUNTER — Encounter (HOSPITAL_COMMUNITY): Payer: Self-pay | Admitting: Neurological Surgery

## 2021-02-25 ENCOUNTER — Ambulatory Visit (HOSPITAL_COMMUNITY): Payer: Medicare PPO | Admitting: Anesthesiology

## 2021-02-25 ENCOUNTER — Ambulatory Visit (HOSPITAL_BASED_OUTPATIENT_CLINIC_OR_DEPARTMENT_OTHER): Payer: Medicare PPO | Admitting: Anesthesiology

## 2021-02-25 ENCOUNTER — Observation Stay (HOSPITAL_COMMUNITY)
Admission: RE | Admit: 2021-02-25 | Discharge: 2021-02-26 | Disposition: A | Discharge status: Home / Self Care | Payer: Medicare PPO | Attending: Neurological Surgery | Admitting: Neurological Surgery

## 2021-02-25 ENCOUNTER — Ambulatory Visit (HOSPITAL_COMMUNITY): Payer: Medicare PPO

## 2021-02-25 ENCOUNTER — Encounter (HOSPITAL_COMMUNITY)
Admission: RE | Disposition: A | Discharge status: Home / Self Care | Payer: Self-pay | Source: Home / Self Care | Attending: Neurological Surgery

## 2021-02-25 DIAGNOSIS — F17210 Nicotine dependence, cigarettes, uncomplicated: Secondary | ICD-10-CM | POA: Insufficient documentation

## 2021-02-25 DIAGNOSIS — Z7982 Long term (current) use of aspirin: Secondary | ICD-10-CM | POA: Insufficient documentation

## 2021-02-25 DIAGNOSIS — Z905 Acquired absence of kidney: Secondary | ICD-10-CM | POA: Insufficient documentation

## 2021-02-25 DIAGNOSIS — M4802 Spinal stenosis, cervical region: Secondary | ICD-10-CM | POA: Insufficient documentation

## 2021-02-25 DIAGNOSIS — M4712 Other spondylosis with myelopathy, cervical region: Secondary | ICD-10-CM

## 2021-02-25 DIAGNOSIS — N189 Chronic kidney disease, unspecified: Secondary | ICD-10-CM | POA: Insufficient documentation

## 2021-02-25 DIAGNOSIS — I10 Essential (primary) hypertension: Secondary | ICD-10-CM | POA: Diagnosis not present

## 2021-02-25 DIAGNOSIS — Z419 Encounter for procedure for purposes other than remedying health state, unspecified: Secondary | ICD-10-CM

## 2021-02-25 DIAGNOSIS — I129 Hypertensive chronic kidney disease with stage 1 through stage 4 chronic kidney disease, or unspecified chronic kidney disease: Secondary | ICD-10-CM | POA: Insufficient documentation

## 2021-02-25 DIAGNOSIS — Z981 Arthrodesis status: Secondary | ICD-10-CM

## 2021-02-25 DIAGNOSIS — Z885 Allergy status to narcotic agent status: Secondary | ICD-10-CM | POA: Diagnosis not present

## 2021-02-25 DIAGNOSIS — Z79899 Other long term (current) drug therapy: Secondary | ICD-10-CM | POA: Diagnosis not present

## 2021-02-25 DIAGNOSIS — J449 Chronic obstructive pulmonary disease, unspecified: Secondary | ICD-10-CM

## 2021-02-25 HISTORY — PX: ANTERIOR CERVICAL DECOMP/DISCECTOMY FUSION: SHX1161

## 2021-02-25 SURGERY — ANTERIOR CERVICAL DECOMPRESSION/DISCECTOMY FUSION 1 LEVEL
Anesthesia: General

## 2021-02-25 MED ORDER — PROPOFOL 10 MG/ML IV BOLUS
INTRAVENOUS | Status: AC
  Filled 2021-02-25: qty 20

## 2021-02-25 MED ORDER — ALBUTEROL SULFATE HFA 108 (90 BASE) MCG/ACT IN AERS: 1.0000 | INHALATION_SPRAY | RESPIRATORY_TRACT | Status: DC | PRN

## 2021-02-25 MED ORDER — DEXAMETHASONE 4 MG PO TABS
4.0000 mg | ORAL_TABLET | Freq: Four times a day (QID) | ORAL | Status: DC
  Administered 2021-02-25 (×2): 4 mg via ORAL
  Filled 2021-02-25 (×2): qty 1

## 2021-02-25 MED ORDER — LAMOTRIGINE 100 MG PO TABS
200.0000 mg | ORAL_TABLET | Freq: Every day | ORAL | Status: DC
  Administered 2021-02-25: 200 mg via ORAL
  Filled 2021-02-25: qty 2

## 2021-02-25 MED ORDER — ONDANSETRON HCL 4 MG PO TABS: 4.0000 mg | ORAL_TABLET | Freq: Four times a day (QID) | ORAL | Status: DC | PRN

## 2021-02-25 MED ORDER — OXYCODONE HCL 5 MG PO TABS
5.0000 mg | ORAL_TABLET | ORAL | Status: DC | PRN
  Administered 2021-02-25 – 2021-02-26 (×6): 5 mg via ORAL
  Filled 2021-02-25 (×6): qty 1

## 2021-02-25 MED ORDER — SODIUM CHLORIDE 0.9% FLUSH
3.0000 mL | Freq: Two times a day (BID) | INTRAVENOUS | Status: DC
  Administered 2021-02-25: 3 mL via INTRAVENOUS

## 2021-02-25 MED ORDER — CHLORHEXIDINE GLUCONATE CLOTH 2 % EX PADS: 6.0000 | MEDICATED_PAD | Freq: Once | CUTANEOUS | Status: DC

## 2021-02-25 MED ORDER — SUGAMMADEX SODIUM 200 MG/2ML IV SOLN
INTRAVENOUS | Status: DC | PRN
  Administered 2021-02-25: 150 mg via INTRAVENOUS
  Administered 2021-02-25: 50 mg via INTRAVENOUS

## 2021-02-25 MED ORDER — LIDOCAINE 2% (20 MG/ML) 5 ML SYRINGE
INTRAMUSCULAR | Status: AC
  Filled 2021-02-25: qty 10

## 2021-02-25 MED ORDER — FENTANYL CITRATE (PF) 250 MCG/5ML IJ SOLN
INTRAMUSCULAR | Status: AC
  Filled 2021-02-25: qty 5

## 2021-02-25 MED ORDER — DEXAMETHASONE SODIUM PHOSPHATE 4 MG/ML IJ SOLN
4.0000 mg | Freq: Four times a day (QID) | INTRAMUSCULAR | Status: DC
  Administered 2021-02-26: 4 mg via INTRAVENOUS
  Filled 2021-02-25: qty 1

## 2021-02-25 MED ORDER — MENTHOL 3 MG MT LOZG
1.0000 | LOZENGE | OROMUCOSAL | Status: DC | PRN
  Filled 2021-02-25: qty 9

## 2021-02-25 MED ORDER — GABAPENTIN 300 MG PO CAPS
300.0000 mg | ORAL_CAPSULE | ORAL | Status: AC
  Administered 2021-02-25: 300 mg via ORAL
  Filled 2021-02-25: qty 1

## 2021-02-25 MED ORDER — SODIUM CHLORIDE 0.9 % IV SOLN: 250.0000 mL | INTRAVENOUS | Status: DC

## 2021-02-25 MED ORDER — SODIUM CHLORIDE 0.9% FLUSH: 3.0000 mL | INTRAVENOUS | Status: DC | PRN

## 2021-02-25 MED ORDER — PHENOL 1.4 % MT LIQD: 1.0000 | OROMUCOSAL | Status: DC | PRN

## 2021-02-25 MED ORDER — LACTATED RINGERS IV SOLN: INTRAVENOUS | Status: DC | PRN

## 2021-02-25 MED ORDER — SENNA 8.6 MG PO TABS
1.0000 | ORAL_TABLET | Freq: Two times a day (BID) | ORAL | Status: DC
  Administered 2021-02-25 (×2): 8.6 mg via ORAL
  Filled 2021-02-25 (×2): qty 1

## 2021-02-25 MED ORDER — ALBUTEROL SULFATE HFA 108 (90 BASE) MCG/ACT IN AERS
INHALATION_SPRAY | RESPIRATORY_TRACT | Status: AC
  Filled 2021-02-25: qty 6.7

## 2021-02-25 MED ORDER — ACETAMINOPHEN 500 MG PO TABS
1000.0000 mg | ORAL_TABLET | ORAL | Status: AC
  Administered 2021-02-25: 1000 mg via ORAL
  Filled 2021-02-25: qty 2

## 2021-02-25 MED ORDER — ONDANSETRON HCL 4 MG/2ML IJ SOLN: 4.0000 mg | Freq: Four times a day (QID) | INTRAMUSCULAR | Status: DC | PRN

## 2021-02-25 MED ORDER — LIDOCAINE 2% (20 MG/ML) 5 ML SYRINGE
INTRAMUSCULAR | Status: AC
  Filled 2021-02-25: qty 5

## 2021-02-25 MED ORDER — ALBUTEROL SULFATE (2.5 MG/3ML) 0.083% IN NEBU
2.5000 mg | INHALATION_SOLUTION | Freq: Four times a day (QID) | RESPIRATORY_TRACT | Status: DC | PRN
Start: 2021-02-25 — End: 2021-02-26

## 2021-02-25 MED ORDER — BUPIVACAINE HCL (PF) 0.25 % IJ SOLN
INTRAMUSCULAR | Status: AC
  Filled 2021-02-25: qty 30

## 2021-02-25 MED ORDER — CHLORHEXIDINE GLUCONATE 0.12 % MT SOLN
15.0000 mL | Freq: Once | OROMUCOSAL | Status: AC
  Administered 2021-02-25: 15 mL via OROMUCOSAL
  Filled 2021-02-25: qty 15

## 2021-02-25 MED ORDER — DEXAMETHASONE SODIUM PHOSPHATE 10 MG/ML IJ SOLN
INTRAMUSCULAR | Status: AC
  Filled 2021-02-25: qty 2

## 2021-02-25 MED ORDER — CEFAZOLIN SODIUM-DEXTROSE 2-4 GM/100ML-% IV SOLN
2.0000 g | INTRAVENOUS | Status: AC
  Administered 2021-02-25: 2 g via INTRAVENOUS
  Filled 2021-02-25: qty 100

## 2021-02-25 MED ORDER — EPHEDRINE SULFATE (PRESSORS) 50 MG/ML IJ SOLN
INTRAMUSCULAR | Status: DC | PRN
  Administered 2021-02-25 (×2): 5 mg via INTRAVENOUS

## 2021-02-25 MED ORDER — FENTANYL CITRATE (PF) 250 MCG/5ML IJ SOLN
INTRAMUSCULAR | Status: DC | PRN
Start: 2021-02-25 — End: 2021-02-25
  Administered 2021-02-25: 100 ug via INTRAVENOUS
  Administered 2021-02-25: 50 ug via INTRAVENOUS

## 2021-02-25 MED ORDER — MIDAZOLAM HCL 5 MG/5ML IJ SOLN
INTRAMUSCULAR | Status: DC | PRN
  Administered 2021-02-25: 2 mg via INTRAVENOUS

## 2021-02-25 MED ORDER — BUPIVACAINE HCL (PF) 0.25 % IJ SOLN
INTRAMUSCULAR | Status: DC | PRN
Start: 2021-02-25 — End: 2021-02-25
  Administered 2021-02-25: 5 mL

## 2021-02-25 MED ORDER — ONDANSETRON HCL 4 MG/2ML IJ SOLN
INTRAMUSCULAR | Status: AC
  Filled 2021-02-25: qty 4

## 2021-02-25 MED ORDER — PHENYLEPHRINE HCL-NACL 20-0.9 MG/250ML-% IV SOLN
INTRAVENOUS | Status: DC | PRN
  Administered 2021-02-25: 25 ug/min via INTRAVENOUS

## 2021-02-25 MED ORDER — THROMBIN 5000 UNITS EX SOLR
CUTANEOUS | Status: AC
  Filled 2021-02-25: qty 5000

## 2021-02-25 MED ORDER — 0.9 % SODIUM CHLORIDE (POUR BTL) OPTIME
TOPICAL | Status: DC | PRN
  Administered 2021-02-25: 1000 mL

## 2021-02-25 MED ORDER — FENTANYL CITRATE (PF) 100 MCG/2ML IJ SOLN
INTRAMUSCULAR | Status: AC
  Administered 2021-02-25: 25 ug via INTRAVENOUS
  Filled 2021-02-25: qty 2

## 2021-02-25 MED ORDER — PROMETHAZINE HCL 25 MG/ML IJ SOLN: 6.2500 mg | INTRAMUSCULAR | Status: DC | PRN

## 2021-02-25 MED ORDER — CEFAZOLIN SODIUM-DEXTROSE 2-4 GM/100ML-% IV SOLN
2.0000 g | Freq: Three times a day (TID) | INTRAVENOUS | Status: AC
  Administered 2021-02-25 – 2021-02-26 (×2): 2 g via INTRAVENOUS
  Filled 2021-02-25 (×2): qty 100

## 2021-02-25 MED ORDER — ORAL CARE MOUTH RINSE: 15.0000 mL | Freq: Once | OROMUCOSAL | Status: AC

## 2021-02-25 MED ORDER — THROMBIN 5000 UNITS EX SOLR
OROMUCOSAL | Status: DC | PRN
  Administered 2021-02-25: 5 mL via TOPICAL

## 2021-02-25 MED ORDER — ONDANSETRON HCL 4 MG/2ML IJ SOLN
INTRAMUSCULAR | Status: DC | PRN
  Administered 2021-02-25: 4 mg via INTRAVENOUS

## 2021-02-25 MED ORDER — PROPOFOL 10 MG/ML IV BOLUS
INTRAVENOUS | Status: DC | PRN
Start: 2021-02-25 — End: 2021-02-25
  Administered 2021-02-25: 140 mg via INTRAVENOUS

## 2021-02-25 MED ORDER — ROCURONIUM BROMIDE 10 MG/ML (PF) SYRINGE
PREFILLED_SYRINGE | INTRAVENOUS | Status: AC
  Filled 2021-02-25: qty 10

## 2021-02-25 MED ORDER — ACETAMINOPHEN 325 MG PO TABS: 650.0000 mg | ORAL_TABLET | ORAL | Status: DC | PRN

## 2021-02-25 MED ORDER — PHENYLEPHRINE HCL (PRESSORS) 10 MG/ML IV SOLN
INTRAVENOUS | Status: DC | PRN
  Administered 2021-02-25 (×4): 80 ug via INTRAVENOUS

## 2021-02-25 MED ORDER — ROCURONIUM BROMIDE 10 MG/ML (PF) SYRINGE
PREFILLED_SYRINGE | INTRAVENOUS | Status: DC | PRN
  Administered 2021-02-25: 60 mg via INTRAVENOUS
  Administered 2021-02-25: 20 mg via INTRAVENOUS

## 2021-02-25 MED ORDER — EPHEDRINE 5 MG/ML INJ
INTRAVENOUS | Status: AC
  Filled 2021-02-25: qty 5

## 2021-02-25 MED ORDER — LIDOCAINE 2% (20 MG/ML) 5 ML SYRINGE
INTRAMUSCULAR | Status: DC | PRN
  Administered 2021-02-25: 80 mg via INTRAVENOUS

## 2021-02-25 MED ORDER — MIDAZOLAM HCL 2 MG/2ML IJ SOLN
INTRAMUSCULAR | Status: AC
  Filled 2021-02-25: qty 2

## 2021-02-25 MED ORDER — PHENYLEPHRINE 40 MCG/ML (10ML) SYRINGE FOR IV PUSH (FOR BLOOD PRESSURE SUPPORT)
PREFILLED_SYRINGE | INTRAVENOUS | Status: AC
  Filled 2021-02-25: qty 20

## 2021-02-25 MED ORDER — ROCURONIUM BROMIDE 10 MG/ML (PF) SYRINGE
PREFILLED_SYRINGE | INTRAVENOUS | Status: AC
  Filled 2021-02-25: qty 20

## 2021-02-25 MED ORDER — POTASSIUM CHLORIDE IN NACL 20-0.9 MEQ/L-% IV SOLN: INTRAVENOUS | Status: DC

## 2021-02-25 MED ORDER — FENTANYL CITRATE (PF) 100 MCG/2ML IJ SOLN
25.0000 ug | INTRAMUSCULAR | Status: DC | PRN
  Administered 2021-02-25: 25 ug via INTRAVENOUS
  Administered 2021-02-25: 50 ug via INTRAVENOUS

## 2021-02-25 MED ORDER — LACTATED RINGERS IV SOLN: INTRAVENOUS | Status: DC

## 2021-02-25 MED ORDER — METHOCARBAMOL 500 MG PO TABS
500.0000 mg | ORAL_TABLET | Freq: Four times a day (QID) | ORAL | Status: DC | PRN
  Administered 2021-02-25 – 2021-02-26 (×3): 500 mg via ORAL
  Filled 2021-02-25 (×3): qty 1

## 2021-02-25 MED ORDER — ACETAMINOPHEN 500 MG PO TABS
1000.0000 mg | ORAL_TABLET | Freq: Four times a day (QID) | ORAL | Status: AC
  Administered 2021-02-25 – 2021-02-26 (×4): 1000 mg via ORAL
  Filled 2021-02-25 (×4): qty 2

## 2021-02-25 MED ORDER — ACETAMINOPHEN 650 MG RE SUPP: 650.0000 mg | RECTAL | Status: DC | PRN

## 2021-02-25 MED ORDER — ALBUTEROL SULFATE HFA 108 (90 BASE) MCG/ACT IN AERS
INHALATION_SPRAY | RESPIRATORY_TRACT | Status: DC | PRN
  Administered 2021-02-25: 3 via RESPIRATORY_TRACT

## 2021-02-25 MED ORDER — MORPHINE SULFATE (PF) 2 MG/ML IV SOLN: 2.0000 mg | INTRAVENOUS | Status: DC | PRN

## 2021-02-25 MED ORDER — DEXAMETHASONE SODIUM PHOSPHATE 10 MG/ML IJ SOLN
INTRAMUSCULAR | Status: DC | PRN
  Administered 2021-02-25: 5 mg via INTRAVENOUS

## 2021-02-25 MED ORDER — METHOCARBAMOL 1000 MG/10ML IJ SOLN
500.0000 mg | Freq: Four times a day (QID) | INTRAVENOUS | Status: DC | PRN
  Administered 2021-02-25: 500 mg via INTRAVENOUS
  Filled 2021-02-25: qty 5

## 2021-02-25 MED ORDER — LOSARTAN POTASSIUM 50 MG PO TABS
25.0000 mg | ORAL_TABLET | Freq: Every day | ORAL | Status: DC
  Administered 2021-02-25: 25 mg via ORAL
  Filled 2021-02-25: qty 1

## 2021-02-25 SURGICAL SUPPLY — 51 items
APL SKNCLS STERI-STRIP NONHPOA (GAUZE/BANDAGES/DRESSINGS) ×1
BAG COUNTER SPONGE SURGICOUNT (BAG) ×3 IMPLANT
BAG SPNG CNTER NS LX DISP (BAG) ×2
BAND INSRT 18 STRL LF DISP RB (MISCELLANEOUS) ×2
BAND RUBBER #18 3X1/16 STRL (MISCELLANEOUS) ×4 IMPLANT
BASKET BONE COLLECTION (BASKET) IMPLANT
BENZOIN TINCTURE PRP APPL 2/3 (GAUZE/BANDAGES/DRESSINGS) ×2 IMPLANT
BIT DRILL 2.3X12 (BIT) ×1 IMPLANT
BUR CARBIDE MATCH 3.0 (BURR) ×2 IMPLANT
CABLE BIPOLOR RESECTION CORD (MISCELLANEOUS) ×1 IMPLANT
CANISTER SUCT 3000ML PPV (MISCELLANEOUS) ×2 IMPLANT
DRAPE C-ARM 42X72 X-RAY (DRAPES) ×4 IMPLANT
DRAPE LAPAROTOMY 100X72 PEDS (DRAPES) ×2 IMPLANT
DRAPE MICROSCOPE LEICA (MISCELLANEOUS) ×2 IMPLANT
DRSG OPSITE POSTOP 3X4 (GAUZE/BANDAGES/DRESSINGS) ×1 IMPLANT
DURAPREP 6ML APPLICATOR 50/CS (WOUND CARE) ×2 IMPLANT
ELECT COATED BLADE 2.86 ST (ELECTRODE) ×2 IMPLANT
ELECT REM PT RETURN 9FT ADLT (ELECTROSURGICAL) ×2
ELECTRODE REM PT RTRN 9FT ADLT (ELECTROSURGICAL) ×1 IMPLANT
GAUZE 4X4 16PLY ~~LOC~~+RFID DBL (SPONGE) ×1 IMPLANT
GLOVE SURG ENC MOIS LTX SZ7 (GLOVE) IMPLANT
GLOVE SURG ENC MOIS LTX SZ8 (GLOVE) ×2 IMPLANT
GLOVE SURG UNDER POLY LF SZ7 (GLOVE) IMPLANT
GOWN STRL REUS W/ TWL LRG LVL3 (GOWN DISPOSABLE) IMPLANT
GOWN STRL REUS W/ TWL XL LVL3 (GOWN DISPOSABLE) IMPLANT
GOWN STRL REUS W/TWL 2XL LVL3 (GOWN DISPOSABLE) ×2 IMPLANT
GOWN STRL REUS W/TWL LRG LVL3 (GOWN DISPOSABLE)
GOWN STRL REUS W/TWL XL LVL3 (GOWN DISPOSABLE)
HEMOSTAT POWDER KIT SURGIFOAM (HEMOSTASIS) ×2 IMPLANT
KIT BASIN OR (CUSTOM PROCEDURE TRAY) ×2 IMPLANT
KIT TURNOVER KIT B (KITS) ×2 IMPLANT
NDL HYPO 25X1 1.5 SAFETY (NEEDLE) ×1 IMPLANT
NDL SPNL 20GX3.5 QUINCKE YW (NEEDLE) ×1 IMPLANT
NEEDLE HYPO 25X1 1.5 SAFETY (NEEDLE) ×2 IMPLANT
NEEDLE SPNL 20GX3.5 QUINCKE YW (NEEDLE) ×2 IMPLANT
NS IRRIG 1000ML POUR BTL (IV SOLUTION) ×2 IMPLANT
PACK LAMINECTOMY NEURO (CUSTOM PROCEDURE TRAY) ×2 IMPLANT
PAD ARMBOARD 7.5X6 YLW CONV (MISCELLANEOUS) ×2 IMPLANT
PIN DISTRACTION 14MM (PIN) ×2 IMPLANT
PLATE ACP INSIGNIA 21 1L (Plate) ×1 IMPLANT
SCREW VA SINGLE LEAD 4X14 (Screw) ×4 IMPLANT
SPACER ASSEM CERV LORD 7M (Spacer) ×1 IMPLANT
SPONGE INTESTINAL PEANUT (DISPOSABLE) ×2 IMPLANT
SPONGE SURGIFOAM ABS GEL SZ50 (HEMOSTASIS) IMPLANT
SPONGE T-LAP 4X18 ~~LOC~~+RFID (SPONGE) ×2 IMPLANT
STRIP CLOSURE SKIN 1/2X4 (GAUZE/BANDAGES/DRESSINGS) ×2 IMPLANT
SUT VIC AB 3-0 SH 8-18 (SUTURE) ×3 IMPLANT
SUT VICRYL 4-0 PS2 18IN ABS (SUTURE) ×1 IMPLANT
TOWEL GREEN STERILE (TOWEL DISPOSABLE) ×2 IMPLANT
TOWEL GREEN STERILE FF (TOWEL DISPOSABLE) ×2 IMPLANT
WATER STERILE IRR 1000ML POUR (IV SOLUTION) ×2 IMPLANT

## 2021-02-25 NOTE — H&P (Signed)
Subjective:   Patient is a 57 y.o. female admitted for acdf. The patient first presented to me with complaints of numbness of the arm(s). Onset of symptoms was several months ago. The pain is described as aching and occurs intermittently. The pain is rated mild, and is located in the neck and radiates to the arms. The symptoms have been progressive. Symptoms are exacerbated by extending head backwards, and are relieved by none.  Previous work up includes MRI of cervical spine, results: spinal stenosis.  Past Medical History:  Diagnosis Date   Arthritis    Bipolar 1 disorder ([place])    Chronic kidney disease    donated left kidney   Heart murmur    Hypertension    Pneumonia 2010   had 2 episodes/ was sent home on O2   Pulmonary embolism ([place]) 1994   Only 1 clot, not on lifelong anticoagulation   Stroke [facility])     Past Surgical History:  Procedure Laterality Date   APPENDECTOMY  2001   ENDARTERECTOMY Left 08/20/2020   Procedure: LEFT CAROTID ARTERY ENDARTERECTOMY;  Surgeon: Cherre Robins, MD;  Location: [place];  Service: Vascular;  Laterality: Left;   Endometriosis s/p left fallopian tube removal.  2001   [place]   Donated kidney to sister   [facility] ANGIOPLASTY Left 08/20/2020   Procedure: PATCH ANGIOPLASTY WITH 1x6 Weyman Pedro;  Surgeon: Cherre Robins, MD;  Location: [facility] OR;  Service: Vascular;  Laterality: Left;   TONSILLECTOMY      Allergies  Allergen Reactions   Iohexol Hives    Hives 06/06/04, needs pre meds    Morphine And Related Hives and Itching    Denies Airway involvement    pt-says she's not allergic to this medication   Sulfa Antibiotics Itching and Rash    Reported with TMP/ SMX  Denies Airway involvement    Social History   Tobacco Use   Smoking status: Every Day    Packs/day: 0.50    Years: 36.00    Pack years: 18.00    Types: Cigarettes   Smokeless tobacco: Never  Substance Use Topics   Alcohol use: Yes    Comment:  occassionally    Family History  Problem Relation Age of Onset   Breast cancer Mother    CVA Mother    Diabetes Sister    Kidney disease Sister    Breast cancer Maternal Grandmother    Colon cancer Paternal Grandmother    Breast cancer Maternal Aunt    Prior to Admission medications   Medication Sig Start Date End Date Taking? Authorizing Provider  atorvastatin (LIPITOR) 80 MG tablet TAKE 1 TABLET BY MOUTH EVERY DAY 02/04/21  Yes Autry-Lott, [name], DO  lamoTRIgine (LAMICTAL) 200 MG tablet TAKE 1 TABLET BY MOUTH EVERY DAY Patient taking differently: Take 200 mg by mouth at bedtime. 12/15/20  Yes Autry-Lott, [name], DO  losartan (COZAAR) 50 MG tablet TAKE 1 TABLET BY MOUTH EVERYDAY AT BEDTIME 02/23/21  Yes Autry-Lott, [name], DO  nicotine polacrilex (NICORETTE) 2 MG gum Take 1 each (2 mg total) by mouth as needed for smoking cessation. 08/17/20  Yes Annita Brod, MD  Polyethyl Glycol-Propyl Glycol (SYSTANE OP) Place 1 drop into both eyes in the morning.   Yes [provider]  traZODone (DESYREL) 100 MG tablet TAKE 1 TABLET BY MOUTH EVERYDAY AT BEDTIME 09/22/20  Yes Autry-Lott, Simone, DO  albuterol (VENTOLIN HFA) 108 (90 Base) MCG/ACT inhaler TAKE 2 PUFFS BY MOUTH EVERY  6 HOURS AS NEEDED FOR WHEEZE 02/12/19   Benay Pike, MD  aspirin EC 81 MG EC tablet Take 1 tablet (81 mg total) by mouth daily. Swallow whole. Patient not taking: Reported on 02/19/2021 08/17/20   Annita Brod, MD     Review of Systems  Positive ROS: neg  All other systems have been reviewed and were otherwise negative with the exception of those mentioned in the HPI and as above.  Objective: Vital signs in last 24 hours: Temp:  [97.9 F (36.6 C)] 97.9 F (36.6 C) (02/15 0659) Pulse Rate:  [97] 97 (02/15 0659) Resp:  [18] 18 (02/15 0659) BP: (157)/(70) 157/70 (02/15 0659) SpO2:  [100 %] 100 % (02/15 0659) Weight:  [81.2 kg] 81.2 kg (02/15 0659)  General Appearance: Alert, cooperative, no  distress, appears stated age Head: Normocephalic, without obvious abnormality, atraumatic Eyes: PERRL, conjunctiva/corneas clear, EOM's intact      Neck: Supple, symmetrical, trachea midline, Back: Symmetric, no curvature, ROM normal, no CVA tenderness Lungs:  respirations unlabored Heart: Regular rate and rhythm Abdomen: Soft, non-tender Extremities: Extremities normal, atraumatic, no cyanosis or edema Pulses: 2+ and symmetric all extremities Skin: Skin color, texture, turgor normal, no rashes or lesions  NEUROLOGIC:  Mental status: Alert and oriented x4, no aphasia, good attention span, fund of knowledge and memory  Motor Exam - grossly normal Sensory Exam - grossly normal Reflexes: 2+ Coordination - grossly normal Gait - grossly normal Balance - grossly normal Cranial Nerves: I: smell Not tested  II: visual acuity  OS: nl    OD: nl  II: visual fields Full to confrontation  II: pupils Equal, round, reactive to light  III,VII: ptosis None  III,IV,VI: extraocular muscles  Full ROM  V: mastication Normal  V: facial light touch sensation  Normal  V,VII: corneal reflex  Present  VII: facial muscle function - upper  Normal  VII: facial muscle function - lower Normal  VIII: hearing Not tested  IX: soft palate elevation  Normal  IX,X: gag reflex Present  XI: trapezius strength  5/5  XI: sternocleidomastoid strength 5/5  XI: neck flexion strength  5/5  XII: tongue strength  Normal    Data Review Lab Results  Component Value Date   WBC 10.3 02/23/2021   HGB 13.4 02/23/2021   HCT 42.3 02/23/2021   MCV 84.4 02/23/2021   PLT 289 02/23/2021   Lab Results  Component Value Date   NA 141 02/23/2021   K 3.5 02/23/2021   CL 104 02/23/2021   CO2 26 02/23/2021   BUN 15 02/23/2021   CREATININE 1.29 (H) 02/23/2021   GLUCOSE 120 (H) 02/23/2021   Lab Results  Component Value Date   INR 0.9 02/23/2021    Assessment:   Cervical neck pain with herniated nucleus pulposus/  spondylosis/ stenosis at C3-4. Estimated body mass index is 26.43 kg/m as calculated from the following:   Height as of this encounter: 5\' 9"  (2.297 m).   Weight as of this encounter: 81.2 kg.  Patient has failed conservative therapy. Planned surgery : acdf c3-4  Plan:   I explained the condition and procedure to the patient and answered any questions.  Patient wishes to proceed with procedure as planned. Understands risks/ benefits/ and expected or typical outcomes.  Eustace Moore 02/25/2021 7:54 AM

## 2021-02-25 NOTE — Op Note (Signed)
02/25/2021  10:38 AM  PATIENT:  [name]  56 y.o. 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 Decompressive anterior cervical discectomy C3-4, 2. Anterior cervical arthrodesis C3-4 utilizing a cortical cancellus allograft  3. Anterior cervical plating C3-4 utilizing a ATEC plate  SURGEON:  [name], MD  ASSISTANTS: Glenford Peers FNP  ANESTHESIA:   General  EBL: 15 ml  Total I/O In: 900 [I.V.:800; IV Piggyback:100] Out: 15 [Blood:15]  BLOOD ADMINISTERED: none  DRAINS: none  SPECIMEN:  none  INDICATION FOR PROCEDURE: This patient presented with neck pain and shoulder pain with a Hoffmann sign. Imaging showed cervical spinal stenosis C3-4. The patient tried conservative measures without relief. Pain was debilitating. Recommended ACDF with plating. Patient understood the risks, benefits, and alternatives and potential outcomes and wished to proceed.  PROCEDURE DETAILS: Patient was brought to the operating room placed under general endotracheal anesthesia. Patient was placed in the supine position on the operating room table. The neck was prepped with Duraprep and draped in a sterile fashion.   Three cc of local anesthesia was injected and a transverse incision was made on the right side of the neck.  Dissection was carried down thru the subcutaneous tissue and the platysma was  elevated, opened, and undermined with Metzenbaum scissors.  Dissection was then carried out thru an avascular plane leaving the sternocleidomastoid carotid artery and jugular vein laterally and the trachea and esophagus medially with the assistance of my nurse practitioner. The ventral aspect of the vertebral column was identified and a localizing x-ray was taken. The C3-4 level was identified and all in the room agreed with the level. The longus colli muscles were then elevated and the  retractor was placed with the assistance of my nurse practitioner. The annulus was incised and the disc space entered. Discectomy was performed with micro-curettes and pituitary rongeurs. I then used the high-speed drill to drill the endplates down to the level of the posterior longitudinal ligament. The operating microscope was draped and brought into the field provided additional magnification, illumination and visualization. Discectomy was continued posteriorly thru the disc space. Posterior longitudinal ligament was opened with a nerve hook, and then removed along with disc herniation and osteophytes, decompressing the spinal canal and thecal sac. We then continued to remove osteophytic overgrowth and disc material decompressing the neural foramina and exiting nerve roots bilaterally. The scope was angled up and down to help decompress and undercut the vertebral bodies. Once the decompression was completed we could pass a nerve hook circumferentially to assure adequate decompression in the midline and in the neural foramina. So by both visualization and palpation we felt we had an adequate decompression of the neural elements. We then measured the height of the intravertebral disc space and selected a 7 millimeter cortical cancellus allograft. It was then gently positioned in the intravertebral disc space(s) and countersunk. I then used a 21 mm ATEC plate and placed 14 mm variable angle screws into the vertebral bodies of each level and locked them into position. The wound was irrigated with bacitracin solution, checked for hemostasis which was established and confirmed. Once meticulous hemostasis was achieved, we then proceeded with closure with the assistance of my nurse practitioner. The platysma was closed with interrupted 3-0 undyed Vicryl suture, the subcuticular layer was closed with interrupted 3-0 undyed Vicryl suture. The skin edges were approximated with steristrips. The drapes were removed. A sterile  dressing was applied. The patient was then awakened from general anesthesia and transferred to the recovery room in stable condition. At the end of the procedure all sponge, needle and instrument counts were correct.   PLAN OF CARE: Admit for overnight observation  PATIENT DISPOSITION:  PACU - hemodynamically stable.   Delay start of Pharmacological VTE agent (>24hrs) due to surgical blood loss or risk of bleeding:  yes

## 2021-02-25 NOTE — Transfer of Care (Signed)
Immediate Anesthesia Transfer of Care Note  Patient: [name]  Procedure(s) Performed: Anterior Cervical Discectomy Fusion - Cervical Three-Cervical Four  Patient Location: PACU  Anesthesia Type:General  Level of Consciousness: awake, alert , patient cooperative and responds to stimulation  Airway & Oxygen Therapy: Patient Spontanous Breathing and Patient connected to face mask oxygen  Post-op Assessment: Report given to RN, Post -op Vital signs reviewed and stable and Patient moving all extremities X 4  Post vital signs: Reviewed and stable  Last Vitals:  Vitals Value Taken Time  BP    Temp    Pulse 103 02/25/21 1057  Resp 12 02/25/21 1056  SpO2 89 % 02/25/21 1057  Vitals shown include unvalidated device data.  Last Pain:  Vitals:   02/25/21 0743  TempSrc:   PainSc: 0-No pain      Patients Stated Pain Goal: 0 (76/14/70 9295)  Complications: No notable events documented.

## 2021-02-25 NOTE — Anesthesia Postprocedure Evaluation (Signed)
Anesthesia Post Note  Patient: [name]  Procedure(s) Performed: Anterior Cervical Discectomy Fusion - Cervical Three-Cervical Four     Patient location during evaluation: PACU Anesthesia Type: General Level of consciousness: awake and alert Pain management: pain level controlled Vital Signs Assessment: post-procedure vital signs reviewed and stable Respiratory status: spontaneous breathing, nonlabored ventilation, respiratory function stable and patient connected to nasal cannula oxygen Cardiovascular status: blood pressure returned to baseline and stable Postop Assessment: no apparent nausea or vomiting Anesthetic complications: no   No notable events documented.  Last Vitals:  Vitals:   02/25/21 1205 02/25/21 1622  BP: (!) 149/64 (!) 158/82  Pulse: (!) 101 (!) 103  Resp: 16 17  Temp: 36.7 C 36.7 C  SpO2: 95% 98%    Last Pain:  Vitals:   02/25/21 1622  TempSrc: Oral  PainSc:                  Santa Lighter

## 2021-02-25 NOTE — Anesthesia Procedure Notes (Signed)
Procedure Name: Intubation Date/Time: 02/25/2021 8:45 AM Performed by: Glynda Jaeger, CRNA Pre-anesthesia Checklist: Patient identified, Patient being monitored, Timeout performed, Emergency Drugs available and Suction available Patient Re-evaluated:Patient Re-evaluated prior to induction Oxygen Delivery Method: Circle System Utilized Preoxygenation: Pre-oxygenation with 100% oxygen Induction Type: IV induction Ventilation: Mask ventilation without difficulty Laryngoscope Size: 3 and Glidescope Grade View: Grade I Tube type: Oral Tube size: 7.5 mm Number of attempts: 1 Airway Equipment and Method: Stylet Placement Confirmation: ETT inserted through vocal cords under direct vision, positive ETCO2 and breath sounds checked- equal and bilateral Secured at: 22 cm Tube secured with: Tape Dental Injury: Teeth and Oropharynx as per pre-operative assessment  Comments: Elective glidescope

## 2021-02-26 ENCOUNTER — Encounter (HOSPITAL_COMMUNITY): Payer: Self-pay | Admitting: Neurological Surgery

## 2021-02-26 DIAGNOSIS — M4712 Other spondylosis with myelopathy, cervical region: Secondary | ICD-10-CM | POA: Diagnosis not present

## 2021-02-26 MED ORDER — OXYCODONE HCL 5 MG PO TABS: 5.0000 mg | ORAL_TABLET | ORAL | 0 refills | Status: DC | PRN

## 2021-02-26 NOTE — Evaluation (Signed)
Occupational Therapy Evaluation Patient Details Name: [name] MRN: [id number] DOB: [birth year] Today's Date: 02/26/2021   History of Present Illness 56 y.o. F admitted on 2/15 for Decompressive anterior cervical discectomy C3-4. PMH significant for CVA, HTN, Arthritis, Bipolar Disorder.   Clinical Impression   Pt admitted for concerns listed above. PTA pt reported that she was independent with all ADL's and IADL's. At this time, pt is mildly limited by pain, however continues to be able to complete all BADL's with no difficulties. Educated on cervical precautions and compensatory strategies. Pt has no further OT needs at this time and acute OT will sign off.       Recommendations for follow up therapy are one component of a multi-disciplinary discharge planning process, led by the attending physician.  Recommendations may be updated based on patient status, additional functional criteria and insurance authorization.   Follow Up Recommendations  No OT follow up    Assistance Recommended at Discharge PRN  Patient can return home with the following A little help with bathing/dressing/bathroom    Functional Status Assessment  Patient has had a recent decline in their functional status and demonstrates the ability to make significant improvements in function in a reasonable and predictable amount of time.  Equipment Recommendations  None recommended by OT    Recommendations for Other Services       Precautions / Restrictions Precautions Precautions: Cervical Precaution Booklet Issued: Yes (comment) Precaution Comments: Reviewed precautions and compensatory strategies Restrictions Weight Bearing Restrictions: No      Mobility Bed Mobility Overal bed mobility: Modified Independent             General bed mobility comments: educated on log roll technique    Transfers Overall transfer level: Modified independent Equipment used: None               General  transfer comment: No difficulties      Balance Overall balance assessment: No apparent balance deficits (not formally assessed)                                         ADL either performed or assessed with clinical judgement   ADL Overall ADL's : Modified independent                                       General ADL Comments: No difficulties, pt safe with all ADL's and functioanl mobility at this time, educated on compensatory stratgies.     Vision Baseline Vision/History: 0 No visual deficits Ability to See in Adequate Light: 0 Adequate Patient Visual Report: No change from baseline Vision Assessment?: No apparent visual deficits     Perception     Praxis      Pertinent Vitals/Pain Pain Assessment Pain Assessment: 0-10 Pain Score: 4  Pain Location: Neck Pain Descriptors / Indicators: Aching, Discomfort Pain Intervention(s): Monitored during session     Hand Dominance Right   Extremity/Trunk Assessment Upper Extremity Assessment Upper Extremity Assessment: Overall WFL for tasks assessed   Lower Extremity Assessment Lower Extremity Assessment: Overall WFL for tasks assessed   Cervical / Trunk Assessment Cervical / Trunk Assessment: Neck Surgery   Communication Communication Communication: No difficulties   Cognition Arousal/Alertness: Awake/alert Behavior During Therapy: WFL for tasks assessed/performed Overall Cognitive Status: Within Functional Limits for  tasks assessed                                       General Comments  VSS on RA, honeycomb dressing intact and appears dry/clean    Exercises     Shoulder Instructions      Home Living Family/patient expects to be discharged to:: Private residence Living Arrangements: Other relatives Available Help at Discharge: Available PRN/intermittently Type of Home: House Home Access: Level entry     Home Layout: Two level     Bathroom Shower/Tub:  Occupational psychologist: Standard     Home Equipment: None          Prior Functioning/Environment Prior Level of Function : Independent/Modified Independent                        OT Problem List: Decreased strength;Decreased range of motion;Impaired balance (sitting and/or standing);Decreased activity tolerance;Pain      OT Treatment/Interventions:      OT Goals(Current goals can be found in the care plan section) Acute Rehab OT Goals Patient Stated Goal: To go home OT Goal Formulation: With patient Time For Goal Achievement: 02/26/21 Potential to Achieve Goals: Good  OT Frequency:      Co-evaluation              AM-PAC OT "6 Clicks" Daily Activity     Outcome Measure Help from another person eating meals?: None Help from another person taking care of personal grooming?: None Help from another person toileting, which includes using toliet, bedpan, or urinal?: None Help from another person bathing (including washing, rinsing, drying)?: None Help from another person to put on and taking off regular upper body clothing?: None Help from another person to put on and taking off regular lower body clothing?: None 6 Click Score: 24   End of Session Nurse Communication: Mobility status  Activity Tolerance: Patient tolerated treatment well Patient left: in chair (In [place] leaving unit)  OT Visit Diagnosis: Unsteadiness on feet (R26.81);Muscle weakness (generalized) (M62.81)                Time: 9741-6384 OT Time Calculation (min): 20 min Charges:  OT General Charges $OT Visit: 1 Visit OT Evaluation $OT Eval Low Complexity: [place]., OTR/L Acute Rehabilitation  Demaris Leavell Elane Yolanda Bonine 02/26/2021, 10:32 AM

## 2021-02-26 NOTE — Discharge Summary (Signed)
Physician Discharge Summary  Patient ID: LOVEAH LIKE MRN: [id number] DOB/AGE: 1965-10-01 56 y.o.  Admit date: 02/25/2021 Discharge date: 02/26/2021  Admission Diagnoses: Cervical spinal stenosis    Discharge Diagnoses: Same   Discharged Condition: good  Hospital Course: The patient was admitted on 02/25/2021 and taken to the operating room where the patient underwent ACDF C3-4. The patient tolerated the procedure well and was taken to the recovery room and then to the floor in stable condition. The hospital course was routine. There were no complications. The wound remained clean dry and intact. Pt had appropriate neck soreness. No complaints of arm pain or new N/T/W. The patient remained afebrile with stable vital signs, and tolerated a regular diet. The patient continued to increase activities, and pain was well controlled with oral pain medications.   Consults: None  Significant Diagnostic Studies:  Results for orders placed or performed during the hospital encounter of 02/23/21  Surgical PCR Screen   Specimen: Nasal Mucosa; Nasal Swab  Result Value Ref Range   MRSA, PCR NEGATIVE NEGATIVE   Staphylococcus aureus NEGATIVE NEGATIVE  SARS CORONAVIRUS 2 (TAT 6-24 HRS) Nasopharyngeal Nasopharyngeal Swab   Specimen: Nasopharyngeal Swab  Result Value Ref Range   SARS Coronavirus 2 NEGATIVE NEGATIVE  Protime-INR  Result Value Ref Range   Prothrombin Time 12.1 11.4 - 15.2 seconds   INR 0.9 0.8 - 1.2  CBC  Result Value Ref Range   WBC 10.3 4.0 - 10.5 K/uL   RBC 5.01 3.87 - 5.11 MIL/uL   Hemoglobin 13.4 12.0 - 15.0 g/dL   HCT 42.3 36.0 - 46.0 %   MCV 84.4 80.0 - 100.0 fL   MCH 26.7 26.0 - 34.0 pg   MCHC 31.7 30.0 - 36.0 g/dL   RDW 14.6 11.5 - 15.5 %   Platelets 289 150 - 400 K/uL   nRBC 0.0 0.0 - 0.2 %  Basic Metabolic Panel  Result Value Ref Range   Sodium 141 135 - 145 mmol/L   Potassium 3.5 3.5 - 5.1 mmol/L   Chloride 104 98 - 111 mmol/L   CO2 26 22 - 32 mmol/L    Glucose, Bld 120 (H) 70 - 99 mg/dL   BUN 15 6 - 20 mg/dL   Creatinine, Ser 1.29 (H) 0.44 - 1.00 mg/dL   Calcium 9.2 8.9 - 10.3 mg/dL   GFR, Estimated 49 (L) >60 mL/min   Anion gap 11 5 - 15  Type and screen [employer]  Result Value Ref Range   ABO/RH(D) O POS    Antibody Screen POS    Sample Expiration 03/09/2021,2359    Extend sample reason NO TRANSFUSIONS OR PREGNANCY IN THE PAST 3 MONTHS    Antibody Identification      ANTI E Performed at [facility], [address], [place], [place] [address]    Unit Number [id number]    Blood Component Type RED CELLS,LR    Unit division 00    Status of Unit ALLOCATED    Donor AG Type NEGATIVE FOR E ANTIGEN    Transfusion Status OK TO TRANSFUSE    Crossmatch Result COMPATIBLE    Unit Number [id number]    Blood Component Type RED CELLS,LR    Unit division 00    Status of Unit ALLOCATED    Donor AG Type NEGATIVE FOR E ANTIGEN    Transfusion Status OK TO TRANSFUSE    Crossmatch Result COMPATIBLE   BPAM RBC  Result Value Ref Range  Blood Product Unit Number [id number]    PRODUCT CODE A2633H54    Unit Type and Rh 5100    Blood Product Expiration Date [id number]    Blood Product Unit Number [id number]    PRODUCT CODE B5597C16    Unit Type and Rh 5100    Blood Product Expiration Date [id number]     DG Cervical Spine 1 View  Result Date: 02/25/2021 CLINICAL DATA:  C3-C4 ACDF EXAM: DG CERVICAL SPINE - 1 VIEW COMPARISON:  MRI cervical spine 04/14/2019 FINDINGS: Single intraoperative lateral view of the cervical spine. Postsurgical changes of C3-C4 ACDF. Intact hardware without evidence of loosening. Unchanged degenerative disc disease below the fusion. IMPRESSION: Single intraoperative image during C3-C4 ACDF. No evidence of immediate hardware complication. Electronically Signed   By: Maurine Simmering M.D.   On: 02/25/2021 11:09   DG C-Arm 1-60 Min-No Report  Result Date: 02/25/2021 Fluoroscopy was  utilized by the requesting physician.  No radiographic interpretation.   DG C-Arm 1-60 Min-No Report  Result Date: 02/25/2021 Fluoroscopy was utilized by the requesting physician.  No radiographic interpretation.    Antibiotics:  Anti-infectives (From admission, onward)    Start     Dose/Rate Route Frequency Ordered Stop   02/25/21 1700  ceFAZolin (ANCEF) IVPB 2g/100 mL premix        2 g 200 mL/hr over 30 Minutes Intravenous Every 8 hours 02/25/21 1158 02/26/21 0034   02/25/21 0745  ceFAZolin (ANCEF) IVPB 2g/100 mL premix        2 g 200 mL/hr over 30 Minutes Intravenous On call to O.R. 02/25/21 0731 02/25/21 0908       Discharge Exam: Blood pressure 133/89, pulse (!) 103, temperature 98.3 F (36.8 C), temperature source Oral, resp. rate 18, height 5\' 9"  (1.753 m), weight 81.2 kg, SpO2 99 %. Neurologic: Grossly normal Dressing dry  Discharge Medications:   Allergies as of 02/26/2021       Reactions   Iohexol Hives   Hives 06/06/04, needs pre meds   Morphine And Related Hives, Itching   Denies Airway involvement   pt-says she's not allergic to this medication   Sulfa Antibiotics Itching, Rash   Reported with TMP/ SMX  Denies Airway involvement        Medication List     TAKE these medications    albuterol 108 (90 Base) MCG/ACT inhaler Commonly known as: VENTOLIN HFA TAKE 2 PUFFS BY MOUTH EVERY 6 HOURS AS NEEDED FOR WHEEZE   aspirin 81 MG EC tablet Take 1 tablet (81 mg total) by mouth daily. Swallow whole.   atorvastatin 80 MG tablet Commonly known as: LIPITOR TAKE 1 TABLET BY MOUTH EVERY DAY   lamoTRIgine 200 MG tablet Commonly known as: LAMICTAL TAKE 1 TABLET BY MOUTH EVERY DAY What changed: when to take this   losartan 50 MG tablet Commonly known as: COZAAR TAKE 1 TABLET BY MOUTH EVERYDAY AT BEDTIME   nicotine polacrilex 2 MG gum Commonly known as: NICORETTE Take 1 each (2 mg total) by mouth as needed for smoking cessation.   oxyCODONE 5 MG  immediate release tablet Commonly known as: Oxy IR/ROXICODONE Take 1 tablet (5 mg total) by mouth every 4 (four) hours as needed for moderate pain ((score 4 to 6)).   SYSTANE OP Place 1 drop into both eyes in the morning.   traZODone 100 MG tablet Commonly known as: DESYREL TAKE 1 TABLET BY MOUTH EVERYDAY AT BEDTIME        Disposition: Home  Final Dx: ACDF C3-4  Discharge Instructions     Call MD for:  difficulty breathing, headache or visual disturbances   Complete by: As directed    Call MD for:  persistant nausea and vomiting   Complete by: As directed    Call MD for:  redness, tenderness, or signs of infection (pain, swelling, redness, odor or green/yellow discharge around incision site)   Complete by: As directed    Call MD for:  severe uncontrolled pain   Complete by: As directed    Call MD for:  temperature >100.4   Complete by: As directed    Diet - low sodium heart healthy   Complete by: As directed    Increase activity slowly   Complete by: As directed    Remove dressing in 48 hours   Complete by: As directed         Follow-up Information     Eustace Moore, MD. Schedule an appointment as soon as possible for a visit in 2 week(s).   Specialty: Neurosurgery Contact information: [address] [place] [address] [place] [address] [phone]                  Signed: Eustace Moore 02/26/2021, 8:13 AM

## 2021-02-26 NOTE — Progress Notes (Signed)
Pt doing well. Pt given D/C instructions with verbal understanding. Rx's were sent to the pharmacy by MD. Pt's incision is clean and dry with no sign of infection. Pt's IV was removed prior to D/C. Pt D/C'd home via wheelchair per MD order. Pt is stable @ D/C and has no other needs at this time. Dakiya Puopolo, RN  

## 2021-03-02 LAB — TYPE AND SCREEN
ABO/RH(D): O POS
Antibody Screen: POSITIVE
Donor AG Type: NEGATIVE
Donor AG Type: NEGATIVE
Unit division: 0
Unit division: 0

## 2021-03-02 LAB — BPAM RBC
Blood Product Expiration Date: 202302222359
Blood Product Expiration Date: 202303012359
Unit Type and Rh: 5100
Unit Type and Rh: 5100

## 2021-03-03 ENCOUNTER — Encounter (HOSPITAL_COMMUNITY): Payer: Self-pay | Admitting: Neurological Surgery

## 2021-03-05 ENCOUNTER — Telehealth: Payer: Self-pay

## 2021-03-05 NOTE — Telephone Encounter (Signed)
Patient LVM on nurse line regarding sister testing positive for COVID. Patient reports that she lives in the same household as her sister.   Patient requesting rx for Paxlovid. Attempted to call patient to gather more information. Patient did not answer, left VM for patient to return call to office.   Talbot Grumbling, RN

## 2021-03-08 ENCOUNTER — Other Ambulatory Visit: Payer: Self-pay | Admitting: Family Medicine

## 2021-03-11 ENCOUNTER — Other Ambulatory Visit: Payer: Self-pay

## 2021-03-11 ENCOUNTER — Encounter: Payer: Self-pay | Admitting: Family Medicine

## 2021-03-11 ENCOUNTER — Ambulatory Visit: Payer: Medicare PPO | Admitting: Family Medicine

## 2021-03-11 ENCOUNTER — Telehealth: Payer: Self-pay | Admitting: Family Medicine

## 2021-03-11 ENCOUNTER — Ambulatory Visit (INDEPENDENT_AMBULATORY_CARE_PROVIDER_SITE_OTHER): Payer: Medicare PPO | Admitting: Family Medicine

## 2021-03-11 ENCOUNTER — Telehealth: Payer: Self-pay

## 2021-03-11 VITALS — BP 157/76 | HR 87 | Wt 176.0 lb

## 2021-03-11 DIAGNOSIS — I10 Essential (primary) hypertension: Secondary | ICD-10-CM | POA: Diagnosis not present

## 2021-03-11 MED ORDER — OLMESARTAN-AMLODIPINE-HCTZ 20-5-12.5 MG PO TABS: 1.0000 | ORAL_TABLET | Freq: Every day | ORAL | 0 refills | Status: DC

## 2021-03-11 NOTE — Patient Instructions (Addendum)
It was wonderful to see you today. ? ?Please bring ALL of your medications with you to every visit.  ? ?Today we talked about: ? ?Starting triple therapy for your blood pressure. Follow up in 1 week for BP recheck and kidney function check.  ? ?Please be sure to schedule follow up at the front  desk before you leave today.  ? ?If you haven't already, sign up for My Chart to have easy access to your labs results, and communication with your primary care physician. ? ?Please call the clinic at [phone] if your symptoms worsen or you have any concerns. It was our pleasure to serve you. ? ?Dr. Janus Molder ? ?

## 2021-03-11 NOTE — Telephone Encounter (Signed)
Patient calls nurse line stating the new BP medication is ~200 dollars. Patient reports she can no afford this. Patient is requesting a cheaper alternative.  ? ?Will forward to PCP to advise.  ?

## 2021-03-11 NOTE — Telephone Encounter (Signed)
Patient dropped off DMV placard form to be completed. Last DOS was 03/11/21. Placed in [employer]. ?

## 2021-03-11 NOTE — Progress Notes (Addendum)
? ? ?  SUBJECTIVE:  ? ?CHIEF COMPLAINT / HPI:  ? ?Hypertension ?- Medications: Losartan 50 mg qhs ?- Compliance: Yes ?- Checking BP at home: No ?- Denies any SOB, CP, vision changes, LE edema, medication SEs, or symptoms of hypotension ? ?PERTINENT  PMH / PSH: CVA, COPD, CKD stage III, tobacco use diorder, HLD ? ?OBJECTIVE:  ? ?BP (!) 157/76   Pulse 87   Wt 176 lb (79.8 kg)   SpO2 98%   BMI 25.99 kg/m?   ?Physical Exam ?Vitals reviewed.  ?Cardiovascular:  ?   Rate and Rhythm: Normal rate and regular rhythm.  ?   Heart sounds: Normal heart sounds.  ?Pulmonary:  ?   Effort: Pulmonary effort is normal.  ?   Breath sounds: Normal breath sounds.  ?Neurological:  ?   Mental Status: She is alert and oriented to person, place, and time.  ?Psychiatric:     ?   Mood and Affect: Mood normal.     ?   Behavior: Behavior normal.  ? ?ASSESSMENT/PLAN:  ? ?Hypertension, unspecified type ?Chronic. Elevated today. Upon recheck remains elevated. Asymptomatic. She is motivated to get her blood pressure under control. Switched to triple therapy today. BMP today and repeat in 1-2 weeks at follow up. Will address hx of tobacco use and do some motivational interviewing if she is still smoking. This can aid in decreasing BP and beneficial for overall health.  ?- Olmesartan-amLODIPine-HCTZ 20-5-12.5 MG TABS; Take 1 tablet by mouth daily.  Dispense: 90 tablet; Refill: 0 ?- Basic Metabolic Panel ? ?Kolleen Ochsner Autry-Lott, DO ?[place]  ?

## 2021-03-12 LAB — BASIC METABOLIC PANEL
BUN/Creatinine Ratio: 10 (ref 9–23)
BUN: 12 mg/dL (ref 6–24)
CO2: 26 mmol/L (ref 20–29)
Calcium: 9.2 mg/dL (ref 8.7–10.2)
Chloride: 106 mmol/L (ref 96–106)
Creatinine, Ser: 1.22 mg/dL — ABNORMAL HIGH (ref 0.57–1.00)
Glucose: 89 mg/dL (ref 70–99)
Potassium: 4.7 mmol/L (ref 3.5–5.2)
Sodium: 144 mmol/L (ref 134–144)
eGFR: 52 mL/min/{1.73_m2} — ABNORMAL LOW (ref >59)

## 2021-03-12 MED ORDER — LOSARTAN POTASSIUM-HCTZ 50-12.5 MG PO TABS: 1.0000 | ORAL_TABLET | Freq: Every day | ORAL | 0 refills | Status: DC

## 2021-03-12 NOTE — Telephone Encounter (Signed)
Will send in combination of losartan 50 mg and HCTZ 12.5 mg. We can discuss adding amlodipine if needed at follow up. Please let the patient know. Thank you for your help. ? ?Gerlene Fee, DO ?03/12/2021, 2:10 PM ?PGY-3, [place] ? ?

## 2021-03-12 NOTE — Telephone Encounter (Signed)
Reviewed form and placed in PCP's box for completion.  .Joan Herschberger R Adrienne Trombetta, CMA  

## 2021-03-13 ENCOUNTER — Other Ambulatory Visit (HOSPITAL_COMMUNITY): Payer: Self-pay

## 2021-03-16 NOTE — Telephone Encounter (Signed)
Form filled out and placed in RN triage box.  ? ?Gerlene Fee, DO ?03/16/2021, 12:20 PM ?PGY-3, [place] ? ?

## 2021-03-16 NOTE — Telephone Encounter (Signed)
Patient called and informed that forms are ready for pick up. Copy made and placed in batch scanning. Original placed at front desk for pick up.   Christasia Angeletti C Kiya Eno, RN  

## 2021-03-18 ENCOUNTER — Other Ambulatory Visit: Payer: Self-pay

## 2021-03-18 ENCOUNTER — Encounter: Payer: Self-pay | Admitting: Family Medicine

## 2021-03-18 ENCOUNTER — Ambulatory Visit (INDEPENDENT_AMBULATORY_CARE_PROVIDER_SITE_OTHER): Payer: Medicare PPO | Admitting: Family Medicine

## 2021-03-18 VITALS — BP 168/82 | HR 114 | Wt 179.0 lb

## 2021-03-18 DIAGNOSIS — I10 Essential (primary) hypertension: Secondary | ICD-10-CM | POA: Diagnosis not present

## 2021-03-18 NOTE — Progress Notes (Signed)
? ? ?  SUBJECTIVE:  ? ?CHIEF COMPLAINT / HPI:  ? ?Hypertension ?- Medications: Losartan 50 mg qhs; Olmesartan-amLODIPine-HCTZ 20-5-12.5 MG (Sent to pharmacy; [phone] co-pay) Patient continued losartan. Hyzaar sent in place of triple therapy but she did not pick this up. She desires triple therapy and want to attempt to get authorization for it.  ?- Compliance: As above.  ?- Checking BP at home: No ?- Denies any SOB, CP, vision changes, LE edema, medication SEs, or symptoms of hypotension ? ?PERTINENT  PMH / PSH: COPD, CKD, HLD, chronic tachycardia ? ?OBJECTIVE:  ? ?BP (!) 168/82   Pulse (!) 114   Wt 179 lb (81.2 kg)   SpO2 98%   BMI 26.43 kg/m?   ?General: Appears well, no acute distress. Age appropriate. ?Cardiac: RRR, normal heart sounds, no murmurs ?Respiratory: CTAB, normal effort ?Neuro: alert and oriented ?Psych: normal affect ? ?ASSESSMENT/PLAN:  ? ?Hypertension, unspecified type ?Uncontrolled. Asymptomatic. Taking losartan 50 mg qhs. Prefers triple therapy. Pharmacy team, Dr. Valentina Lucks and Rosendo Gros contacted and states Hyzaar would be $0 co-pay. Suggested hyzaar + amlodipine if necessary. Patient continues to desire triple therapy. Will attempt to get pre-authorization. Plan to follow up with patient when answer from insurance available or by Friday of this week. She voiced understanding. Continue current medications. Return precautions given.  ?  ? ?Kimberly Keil Autry-Lott, DO ?[place]  ?

## 2021-03-19 ENCOUNTER — Other Ambulatory Visit (HOSPITAL_COMMUNITY): Payer: Self-pay

## 2021-03-19 ENCOUNTER — Telehealth: Payer: Self-pay

## 2021-03-19 NOTE — Telephone Encounter (Signed)
PCP reports patient wants to try triple therapy vs Hyzaar. ? ?I attempted to do a PA on Olmesartan-amlodopine-HCTZ 20-5-12.'5mg'$ , however PA determination was "PA not needed due to medication being already covered."  ? ?The copay is over 200 dollars. I called her insurance and unfortunately the call was dropped.  ? ?Is there anyway to getting around the high copay when PA is not authorized?  ? ? ? ? ?

## 2021-03-19 NOTE — Telephone Encounter (Signed)
I called patient and she is amenable to this.  ? ?Thank you! ?

## 2021-03-20 MED ORDER — OLMESARTAN-AMLODIPINE-HCTZ 20-5-12.5 MG PO TABS
1.0000 | ORAL_TABLET | Freq: Every day | ORAL | 0 refills | Status: DC
Start: 2021-03-20 — End: 2021-06-12

## 2021-03-20 NOTE — Telephone Encounter (Signed)
RX sent to pharmacy; triple antihypertensive agent.  ? ?Gerlene Fee, DO ?03/20/2021, 9:46 AM ?PGY-3, [place] ? ?

## 2021-04-01 ENCOUNTER — Other Ambulatory Visit: Payer: Self-pay | Admitting: Family Medicine

## 2021-04-02 ENCOUNTER — Ambulatory Visit: Payer: Medicare PPO | Admitting: Family Medicine

## 2021-04-02 NOTE — Progress Notes (Incomplete)
? ? ?  SUBJECTIVE:  ? ?CHIEF COMPLAINT / HPI:  ? ?Hypertension ?- Medications: Losartan 50 mg qhs; Olmesartan-amLODIPine-HCTZ 20-5-12.5 MG (Sent to pharmacy; [phone] co-pay) Patient continued losartan. Hyzaar sent in place of triple therapy but she did not pick this up. She desires triple therapy and want to attempt to get authorization for it.  ?- Compliance: As above.  ?- Checking BP at home: No ?- Denies any SOB, CP, vision changes, LE edema, medication SEs, or symptoms of hypotension ? ?PERTINENT  PMH / PSH: *** ? ?OBJECTIVE:  ? ?There were no vitals taken for this visit.  ?*** ? ?ASSESSMENT/PLAN:  ? ?No problem-specific Assessment & Plan notes found for this encounter. ?  ? ? ?Amiayah Giebel Autry-Lott, DO ?[place]  ?

## 2021-04-02 NOTE — Patient Instructions (Incomplete)
It was wonderful to see you today. ? ?Please bring ALL of your medications with you to every visit.  ? ?Today we talked about: ? ?** ? ?Please be sure to schedule follow up at the front  desk before you leave today.  ? ?If you haven't already, sign up for My Chart to have easy access to your labs results, and communication with your primary care physician. ? ?Please call the clinic at [phone] if your symptoms worsen or you have any concerns. It was our pleasure to serve you. ? ?Dr. Janus Molder ? ?

## 2021-04-21 ENCOUNTER — Encounter: Payer: Self-pay | Admitting: Gastroenterology

## 2021-04-28 ENCOUNTER — Ambulatory Visit: Payer: Medicare PPO | Admitting: Neurology

## 2021-04-28 ENCOUNTER — Encounter: Payer: Self-pay | Admitting: Neurology

## 2021-04-28 VITALS — BP 152/82 | HR 112 | Ht 69.0 in | Wt 175.5 lb

## 2021-04-28 DIAGNOSIS — I639 Cerebral infarction, unspecified: Secondary | ICD-10-CM

## 2021-04-28 DIAGNOSIS — M4802 Spinal stenosis, cervical region: Secondary | ICD-10-CM | POA: Diagnosis not present

## 2021-04-28 DIAGNOSIS — R930 Abnormal findings on diagnostic imaging of skull and head, not elsewhere classified: Secondary | ICD-10-CM
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
## 2021-05-01 ENCOUNTER — Other Ambulatory Visit: Payer: Self-pay | Admitting: Family Medicine

## 2021-05-18 DIAGNOSIS — L819 Disorder of pigmentation, unspecified: Secondary | ICD-10-CM | POA: Diagnosis not present

## 2021-05-20 ENCOUNTER — Encounter: Payer: Self-pay | Admitting: Family Medicine

## 2021-05-21 ENCOUNTER — Ambulatory Visit: Payer: Medicare PPO | Admitting: Family Medicine

## 2021-05-22 ENCOUNTER — Other Ambulatory Visit: Payer: Self-pay | Admitting: Family Medicine

## 2021-05-22 DIAGNOSIS — I10 Essential (primary) hypertension: Secondary | ICD-10-CM

## 2021-05-27 ENCOUNTER — Ambulatory Visit (INDEPENDENT_AMBULATORY_CARE_PROVIDER_SITE_OTHER): Payer: Medicare PPO | Admitting: Family Medicine

## 2021-05-27 ENCOUNTER — Encounter: Payer: Self-pay | Admitting: Family Medicine

## 2021-05-27 VITALS — BP 131/66 | HR 100 | Ht 69.0 in | Wt 176.0 lb

## 2021-05-27 DIAGNOSIS — I10 Essential (primary) hypertension: Secondary | ICD-10-CM | POA: Diagnosis not present

## 2021-05-27 NOTE — Patient Instructions (Signed)
Continue current medication for your blood pressure. ?

## 2021-05-27 NOTE — Progress Notes (Signed)
? ? ?  SUBJECTIVE:  ? ?CHIEF COMPLAINT / HPI:  ? ?Hypertension: ?- Medications: Olmesartan-amlodipine-HCTZ 20-5-12.5 mg daily ?- Compliance: Yes ?- Denies any SOB, CP, vision changes, LE edema, medication SEs, or symptoms of hypotension ? ?PERTINENT  PMH / PSH: CKD stage III ? ?OBJECTIVE:  ? ?BP 131/66   Pulse 100   Ht '5\' 9"'$  (1.753 m)   Wt 176 lb (79.8 kg)   SpO2 100%   BMI 25.99 kg/m?   ?General: Appears well, no acute distress. Age appropriate. ?Respiratory: normal effort ?Neuro: alert and oriented ?Psych: normal affect ? ?ASSESSMENT/PLAN:  ? ?Hypertension, unspecified type ?Well-controlled continue current medications.  Needs BMP at follow-up ? ?Raja Liska Autry-Lott, DO ?[place]  ?

## 2021-06-09 DIAGNOSIS — M542 Cervicalgia: Secondary | ICD-10-CM | POA: Diagnosis not present

## 2021-06-09 DIAGNOSIS — Z6826 Body mass index (BMI) 26.0-26.9, adult: Secondary | ICD-10-CM | POA: Diagnosis not present

## 2021-06-11 ENCOUNTER — Other Ambulatory Visit: Payer: Self-pay | Admitting: Family Medicine

## 2021-06-11 DIAGNOSIS — I10 Essential (primary) hypertension: Secondary | ICD-10-CM

## 2021-06-16 ENCOUNTER — Encounter: Payer: Self-pay | Admitting: *Deleted

## 2021-06-22 ENCOUNTER — Other Ambulatory Visit: Payer: Self-pay | Admitting: Family Medicine

## 2021-06-22 DIAGNOSIS — I10 Essential (primary) hypertension: Secondary | ICD-10-CM

## 2021-06-29 ENCOUNTER — Ambulatory Visit (INDEPENDENT_AMBULATORY_CARE_PROVIDER_SITE_OTHER): Payer: Medicare PPO | Admitting: Neurology

## 2021-06-29 ENCOUNTER — Encounter: Payer: Self-pay | Admitting: Neurology

## 2021-06-29 VITALS — BP 128/64 | HR 103 | Ht 69.0 in | Wt 177.8 lb

## 2021-06-29 DIAGNOSIS — I63239 Cerebral infarction due to unspecified occlusion or stenosis of unspecified carotid arteries: Secondary | ICD-10-CM | POA: Diagnosis not present

## 2021-06-29 DIAGNOSIS — E663 Overweight: Secondary | ICD-10-CM | POA: Diagnosis not present

## 2021-06-29 DIAGNOSIS — R930 Abnormal findings on diagnostic imaging of skull and head, not elsewhere classified: Secondary | ICD-10-CM

## 2021-06-29 DIAGNOSIS — Z820 Family history of epilepsy and other diseases of the nervous system: Secondary | ICD-10-CM | POA: Diagnosis not present

## 2021-06-29 DIAGNOSIS — I639 Cerebral infarction, unspecified: Secondary | ICD-10-CM

## 2021-06-29 DIAGNOSIS — R0683 Snoring: Secondary | ICD-10-CM | POA: Diagnosis not present

## 2021-06-29 NOTE — Patient Instructions (Signed)

## 2021-06-29 NOTE — Progress Notes (Signed)
Subjective:    Patient ID: [name] is a 56 y.o. female.  HPI    Star Age, MD, PhD [facility] [address], [address] [place], [place] [address]  Dear [name],   I saw your patient, [name], upon your kind request in my sleep clinic today for initial consultation of her sleep disorder, in particular, concern for underlying obstructive sleep apnea.  The patient is unaccompanied today.  As you know, 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 I reviewed your office note from 04/28/2021.  Her Epworth sleepiness score is 3 out of 24, fatigue severity score is 14 out of 63.  She has an uncle with sleep apnea.  She has been snoring for years.  Bedtime is 11 PM and rise time around 6 AM.  She lives with her sister.  She has no night to night nocturia or recurrent morning or nocturnal headaches.  She works in [employer].  She currently smokes about 1/4 pack/day to up to half a pack per day.  She previously was able to quit briefly.  She is working on smoking cessation.  She drinks caffeine in the form of coffee, 1 cup in the morning and 1 soda at lunchtime.  She drinks alcohol rarely.  She had a tonsillectomy and adenoidectomy as a child.  Her Past Medical History Is Significant For: Past Medical History:  Diagnosis Date   Arthritis    Bipolar 1 disorder ([place])    Chronic kidney disease    donated left kidney   Heart murmur    Hypertension    Pneumonia 2010   had 2 episodes/ was sent home on O2   Pulmonary embolism  ([place]) 1994   Only 1 clot, not on lifelong anticoagulation   Stroke [facility])     Her Past Surgical History Is Significant For: Past Surgical History:  Procedure Laterality Date   ANTERIOR CERVICAL DECOMP/DISCECTOMY FUSION N/A 02/25/2021   Procedure: Anterior Cervical Discectomy Fusion - Cervical Three-Cervical Four;  Surgeon: Eustace Moore, MD;  Location: [place];  Service: Neurosurgery;  Laterality: N/A;  Anterior Cervical Discectomy Fusion - Cervical Three-Cervical Four   APPENDECTOMY  2001   ENDARTERECTOMY Left 08/20/2020   Procedure: LEFT CAROTID ARTERY ENDARTERECTOMY;  Surgeon: Cherre Robins, MD;  Location: [place] OR;  Service: Vascular;  Laterality: Left;   Endometriosis s/p left fallopian tube removal.  2001   [place]   Donated kidney to sister   [facility] ANGIOPLASTY Left 08/20/2020   Procedure: PATCH ANGIOPLASTY WITH 1x6 Weyman Pedro;  Surgeon: Cherre Robins, MD;  Location: [facility] OR;  Service: Vascular;  Laterality: Left;   TONSILLECTOMY      Her Family History Is Significant For: Family History  Problem Relation Age of Onset   Breast cancer Mother    CVA Mother    Diabetes Sister    Kidney disease Sister    Breast cancer Maternal Grandmother    Colon cancer Paternal Grandmother    Breast cancer Maternal Aunt     Her Social History Is Significant For: Social History   Socioeconomic History   Marital  status: Divorced    Spouse name: Not on file   Number of children: Not on file   Years of education: Not on file   Highest education level: Not on file  Occupational History   Not on file  Tobacco Use   Smoking status: Every Day    Packs/day: 0.50    Years: 36.00    Total pack years: 18.00    Types: Cigarettes   Smokeless tobacco: Never  Vaping Use   Vaping Use: Some days  Substance and Sexual Activity   Alcohol use: Yes    Comment: occassionally   Drug use: No   Sexual activity: Yes    Birth control/protection: Condom    Comment: Partner  with Vasectomy  Other Topics Concern   Not on file  Social History Narrative   Not on file   Social Determinants of Health   Financial Resource Strain: Not on file  Food Insecurity: Not on file  Transportation Needs: Not on file  Physical Activity: Not on file  Stress: Not on file  Social Connections: Not on file    Her Allergies Are:  Allergies  Allergen Reactions   Iohexol Hives    Hives 06/06/04, needs pre meds    Morphine And Related Hives and Itching    Denies Airway involvement    pt-says she's not allergic to this medication   Sulfa Antibiotics Itching and Rash    Reported with TMP/ SMX  Denies Airway involvement  :   Her Current Medications Are:  Outpatient Encounter Medications as of 06/29/2021  Medication Sig   albuterol (VENTOLIN HFA) 108 (90 Base) MCG/ACT inhaler TAKE 2 PUFFS BY MOUTH EVERY 6 HOURS AS NEEDED FOR WHEEZE   aspirin EC 81 MG EC tablet Take 1 tablet (81 mg total) by mouth daily. Swallow whole.   atorvastatin (LIPITOR) 80 MG tablet TAKE 1 TABLET BY MOUTH EVERY DAY   lamoTRIgine (LAMICTAL) 200 MG tablet Take 1 tablet (200 mg total) by mouth at bedtime.   nicotine polacrilex (NICORETTE) 2 MG gum Take 1 each (2 mg total) by mouth as needed for smoking cessation.   Olmesartan-amLODIPine-HCTZ 20-5-12.5 MG TABS TAKE 1 TABLET BY MOUTH EVERY DAY   oxyCODONE (OXY IR/ROXICODONE) 5 MG immediate release tablet Take 1 tablet (5 mg total) by mouth every 4 (four) hours as needed for moderate pain ((score 4 to 6)).   Polyethyl Glycol-Propyl Glycol (SYSTANE OP) Place 1 drop into both eyes in the morning.   traZODone (DESYREL) 100 MG tablet TAKE 1 TABLET BY MOUTH EVERYDAY AT BEDTIME   No facility-administered encounter medications on file as of 06/29/2021.  :   Review of Systems:  Out of a complete 14 point review of systems, all are reviewed and negative with the exception of these symptoms as listed below:   Review of Systems  Neurological:        Hx loud  snoring,  had stroke in 2022.   ESS 1, FSS 14.    Objective:  Neurological Exam  Physical Exam Physical Examination:   Vitals:   06/29/21 1250  BP: 128/64  Pulse: (!) 103    General Examination: The patient is a very pleasant 56 y.o. female in no acute distress. She appears well-developed and well-nourished and well groomed.   HEENT: Normocephalic, atraumatic, pupils are equal, round and reactive to light, extraocular tracking is good without limitation to gaze excursion or nystagmus noted. Hearing is grossly intact. Face is symmetric with normal facial animation. Speech is  clear with no dysarthria noted. There is no hypophonia. There is no lip, neck/head, jaw or voice tremor. Neck is supple with full range of passive and active motion. There are no carotid bruits on auscultation, scar left anterior neck. Oropharynx exam reveals: mild mouth dryness, good dental hygiene and mild airway crowding, due to smaller airway entry. Mallampati is class III. Tongue protrudes centrally and palate elevates symmetrically. Tonsils are absent. Neck size is 16 inches. She has a minimal overbite. Nasal inspection reveals no significant nasal mucosal bogginess or redness and no septal deviation.   Chest: Clear to auscultation without wheezing, rhonchi or crackles noted.  Heart: S1+S2+0, regular and normal without murmurs, rubs or gallops noted.   Abdomen: Soft, non-tender and non-distended with normal bowel sounds appreciated on auscultation.  Extremities: There is no pitting edema in the distal lower extremities bilaterally.   Skin: Warm and dry without trophic changes noted.   Musculoskeletal: exam reveals no obvious joint deformities.   Neurologically:  Mental status: The patient is awake, alert and oriented in all 4 spheres. Her immediate and remote memory, attention, language skills and fund of knowledge are appropriate. There is no evidence of aphasia, agnosia, apraxia or anomia. Speech is clear  with normal prosody and enunciation. Thought process is linear. Mood is normal and affect is normal.  Cranial nerves II - XII are as described above under HEENT exam.  Motor exam: Normal bulk, strength and tone is noted. There is no tremor, Romberg is negative. Reflexes are 2+ throughout. Fine motor skills and coordination: grossly intact.  Cerebellar testing: No dysmetria or intention tremor. There is no truncal or gait ataxia.  Sensory exam: intact to light touch in the upper and lower extremities.  Gait, station and balance: She stands easily. No veering to one side is noted. No leaning to one side is noted. Posture is age-appropriate and stance is narrow based. Gait shows normal stride length and normal pace. No problems turning are noted.   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 I had a long chat with the patient about my findings and the diagnosis of OSA, its prognosis and treatment options. We talked about medical treatments, surgical interventions and non-pharmacological approaches. I explained in particular the risks and ramifications of untreated moderate to severe OSA, especially with respect to developing cardiovascular disease down the Road, including congestive heart failure, difficult to treat hypertension, cardiac arrhythmias, or stroke. Even type 2 diabetes has, in part, been linked to untreated OSA. Symptoms of untreated OSA include daytime sleepiness, memory problems, mood  irritability and mood disorder such as depression and anxiety, lack of energy, as well as recurrent headaches, especially morning headaches. We talked about smoking cessation and trying to maintain a healthy lifestyle in general, as well as the importance of weight control. I recommended the following at this time: sleep study.  I outlined the differences between a laboratory attended sleep study versus home sleep testing. I explained the sleep test procedure to the patient and also outlined possible surgical and non-surgical treatment options of OSA, including  the use of a custom-made dental device (which would require a referral to a specialist dentist or oral surgeon), upper airway surgical options, such as traditional UPPP or a novel less invasive surgical option in the form of Inspire hypoglossal nerve stimulation (which would involve a referral to an ENT surgeon). I also explained the CPAP treatment option to the patient, who indicated that she would be willing to try PAP therapy, if the need arises. I explained the importance of being compliant with PAP treatment, not only for insurance purposes but primarily to improve Her symptoms, and for the patient's long term health benefit, including to reduce Her cardiovascular risks. I answered all her questions today and the patient was in agreement. I plan to see her back after the sleep study is completed and encouraged her to call with any interim questions, concerns, problems or updates.   Thank you very much for allowing me to participate in the care of this nice patient. If I can be of any further assistance to you please do not hesitate to call me at [phone].  Sincerely,   Star Age, MD, PhD

## 2021-07-08 ENCOUNTER — Telehealth: Payer: Self-pay | Admitting: Neurology

## 2021-07-08 NOTE — Telephone Encounter (Signed)
[employer] pending uploaded notes on the portal  

## 2021-07-09 NOTE — Telephone Encounter (Signed)
spoke to the patient she states she wcb to schedule  Craig Staggers: [phone] (exp. 07/08/21 to 08/07/21)

## 2021-07-13 ENCOUNTER — Ambulatory Visit
Admission: EM | Admit: 2021-07-13 | Discharge: 2021-07-13 | Disposition: A | Discharge status: Home / Self Care | Payer: Medicare PPO | Attending: Internal Medicine | Admitting: Internal Medicine

## 2021-07-13 DIAGNOSIS — L739 Follicular disorder, unspecified: Secondary | ICD-10-CM | POA: Diagnosis not present

## 2021-07-13 MED ORDER — DOXYCYCLINE HYCLATE 100 MG PO CAPS: 100.0000 mg | ORAL_CAPSULE | Freq: Two times a day (BID) | ORAL | 0 refills | Status: AC

## 2021-07-13 NOTE — Discharge Instructions (Addendum)
Continue heating pad use Take antibiotics as recommended If you see any drainage, worsening swelling, worsening pain-please return to urgent care to be reevaluated.

## 2021-07-13 NOTE — ED Provider Notes (Signed)
[facility]    CSN: [id number] Arrival date & time: 07/13/21  0803      History   Chief Complaint Chief Complaint  Patient presents with   Cyst    HPI [name] is a 56 y.o. female comes to the urgent care with painful swelling in the right inner thigh of 2 days duration.  Symptoms started insidiously and has been persistent.  She denies any fever or chills.  No trauma to the right thigh area.  No nausea or vomiting.  Patient has tried over-the-counter Tylenol/ibuprofen with no improvement in pain.  HPI  Past Medical History:  Diagnosis Date   Arthritis    Bipolar 1 disorder ([place])    Chronic kidney disease    donated left kidney   Heart murmur    Hypertension    Pneumonia 2010   had 2 episodes/ was sent home on O2   Pulmonary embolism ([place]) 1994   Only 1 clot, not on lifelong anticoagulation   Stroke [facility])     Patient Active Problem List   Diagnosis Date Noted   Cervical stenosis of spinal canal 04/28/2021   S/P cervical spinal fusion 02/25/2021   Carotid atherosclerosis, left 01/13/2021   Myofascial pain 12/20/2020   High serum vitamin B12 12/01/2020   Cerebrovascular accident (CVA) ([place]) 10/28/2020   Cervical stenosis (uterine cervix) 10/28/2020   Abnormal MRI of head 10/28/2020   Carotid stenosis, symptomatic, with infarction ([place]) 08/21/2020   Acute CVA (cerebrovascular accident) ([place]) 08/15/2020   Hypokalemia 08/15/2020   Hyperlipidemia 08/15/2020   COPD (chronic obstructive pulmonary disease) ([place]) 11/11/2019   History of pulmonary embolism 11/11/2019   Cervical spinal stenosis 04/17/2019   Chronic kidney disease (CKD), stage III (moderate) ([place]) 01/26/2019   Sinus tachycardia 12/07/2017   Leukocytosis 12/06/2017   Thoracic back pain 10/21/2017   Degeneration of lumbar intervertebral disc 02/09/2017   Undiagnosed cardiac murmurs 07/12/2016   HTN (hypertension) 06/06/2014   Tobacco abuse 10/07/2011   Currently attempting to quit  smoking 06/16/2011   Bipolar 1 disorder ([place]) 08/17/2010   Kidney donor 08/14/2010    Past Surgical History:  Procedure Laterality Date   ANTERIOR CERVICAL DECOMP/DISCECTOMY FUSION N/A 02/25/2021   Procedure: Anterior Cervical Discectomy Fusion - Cervical Three-Cervical Four;  Surgeon: Eustace Moore, MD;  Location: [place];  Service: Neurosurgery;  Laterality: N/A;  Anterior Cervical Discectomy Fusion - Cervical Three-Cervical Four   APPENDECTOMY  2001   ENDARTERECTOMY Left 08/20/2020   Procedure: LEFT CAROTID ARTERY ENDARTERECTOMY;  Surgeon: Cherre Robins, MD;  Location: [place];  Service: Vascular;  Laterality: Left;   Endometriosis s/p left fallopian tube removal.  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      Home Medications    Prior to Admission medications   Medication Sig Start Date End Date Taking? Authorizing Provider  doxycycline (VIBRAMYCIN) 100 MG capsule Take 1 capsule (100 mg total) by mouth 2 (two) times daily for 7 days. 07/13/21 07/20/21 Yes Koua Deeg, Myrene Galas, MD  albuterol (VENTOLIN HFA) 108 (90 Base) MCG/ACT inhaler TAKE 2 PUFFS BY MOUTH EVERY 6 HOURS AS NEEDED FOR WHEEZE 02/12/19   Benay Pike, MD  aspirin EC 81 MG EC tablet Take 1 tablet (81 mg total) by mouth daily. Swallow whole. 08/17/20   Annita Brod, MD  atorvastatin (LIPITOR) 80 MG tablet TAKE 1 TABLET BY MOUTH EVERY DAY 05/01/21   Ezequiel Essex, MD  lamoTRIgine (LAMICTAL) 200 MG tablet Take 1 tablet (200 mg total) by mouth at bedtime. 06/23/21   Autry-Lott, [name], DO  nicotine polacrilex (NICORETTE) 2 MG gum Take 1 each (2 mg total) by mouth as needed for smoking cessation. 08/17/20   Annita Brod, MD  Olmesartan-amLODIPine-HCTZ  20-5-12.5 MG TABS TAKE 1 TABLET BY MOUTH EVERY DAY 06/12/21   Ezequiel Essex, MD  oxyCODONE (OXY IR/ROXICODONE) 5 MG immediate release tablet Take 1 tablet (5 mg total) by mouth every 4 (four) hours as needed for moderate pain ((score 4 to 6)). 02/26/21   Eustace Moore, MD  Polyethyl Glycol-Propyl Glycol (SYSTANE OP) Place 1 drop into both eyes in the morning.    [provider]  traZODone (DESYREL) 100 MG tablet TAKE 1 TABLET BY MOUTH EVERYDAY AT BEDTIME 06/23/21   Autry-Lott, [name], DO    Family History Family History  Problem Relation Age of Onset   Breast cancer Mother    CVA Mother    Diabetes Sister    Kidney disease Sister    Breast cancer Maternal Grandmother    Colon cancer Paternal Grandmother    Breast cancer Maternal Aunt     Social History Social History   Tobacco Use   Smoking status: Every Day    Packs/day: 0.50    Years: 36.00    Total pack years: 18.00    Types: Cigarettes   Smokeless tobacco: Never  Vaping Use   Vaping Use: Some days  Substance Use Topics   Alcohol use: Yes    Comment: occassionally   Drug use: No     Allergies   Iohexol, Morphine and related, and Sulfa antibiotics   Review of Systems Review of Systems  Genitourinary: Negative.   Musculoskeletal: Negative.   Skin:  Positive for color change. Negative for wound.     Physical Exam Triage Vital Signs ED Triage Vitals  Enc Vitals Group     BP 07/13/21 0815 110/70     Pulse Rate 07/13/21 0815 99     Resp 07/13/21 0815 15     Temp 07/13/21 0815 97.8 F (36.6 C)     Temp src --      SpO2 07/13/21 0815 93 %     Weight --      Height --      Head Circumference --      Peak Flow --      Pain Score 07/13/21 0814 10     Pain Loc --      Pain Edu? --      Excl. in [place]? --    No data found.  Updated Vital Signs BP 110/70 (BP Location: Right Arm)   Pulse 99   Temp 97.8 F (36.6 C)   Resp 15   SpO2 93%   Visual Acuity Right Eye Distance:   Left Eye Distance:    Bilateral Distance:    Right Eye Near:   Left Eye Near:    Bilateral Near:     Physical Exam Vitals and nursing note reviewed.  Constitutional:      General: She is not in acute distress.    Appearance: Normal appearance. She is not ill-appearing.  Cardiovascular:     Rate and  Rhythm: Normal rate and regular rhythm.  Musculoskeletal:        General: Normal range of motion.  Skin:    Comments: Tender papular lesion on the right inner thigh surrounded by induration..  Mild erythema.  Area of induration measures about 1 inch in the longest diameter.  No fluctuance.  Neurological:     Mental Status: She is alert.      UC Treatments / Results  Labs (all labs ordered are listed, but only abnormal results are displayed) Labs Reviewed - No data to display  EKG   Radiology No results found.  Procedures Procedures (including critical care time)  Medications Ordered in UC Medications - No data to display  Initial Impression / Assessment and Plan / UC Course  I have reviewed the triage vital signs and the nursing notes.  Pertinent labs & imaging results that were available during my care of the patient were reviewed by me and considered in my medical decision making (see chart for details).     1.  Folliculitis: Doxycycline 100 mg twice daily for 7 days Ibuprofen as needed for pain Heating pad use 2-3 times a day Return precautions given. Final Clinical Impressions(s) / UC Diagnoses   Final diagnoses:  Folliculitis     Discharge Instructions      Continue heating pad use Take antibiotics as recommended If you see any drainage, worsening swelling, worsening pain-please return to urgent care to be reevaluated.   ED Prescriptions     Medication Sig Dispense Auth. Provider   doxycycline (VIBRAMYCIN) 100 MG capsule Take 1 capsule (100 mg total) by mouth 2 (two) times daily for 7 days. 14 capsule Javius Sylla, Myrene Galas, MD      PDMP not reviewed this encounter.    Chase Picket, MD 07/13/21 [phone]

## 2021-07-13 NOTE — ED Triage Notes (Signed)
Patient presents to Urgent Care with complaints of R groin cyst  since friday. Patient reports hot compresses for pain .

## 2021-07-13 NOTE — ED Notes (Signed)
Nonadherent dressing applied to wound area

## 2021-08-15 ENCOUNTER — Other Ambulatory Visit: Payer: Self-pay | Admitting: Family Medicine

## 2021-08-15 DIAGNOSIS — I10 Essential (primary) hypertension: Secondary | ICD-10-CM

## 2021-09-03 ENCOUNTER — Other Ambulatory Visit: Payer: Self-pay | Admitting: Obstetrics and Gynecology

## 2021-09-03 DIAGNOSIS — Z1231 Encounter for screening mammogram for malignant neoplasm of breast: Secondary | ICD-10-CM
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
## 2021-09-24 ENCOUNTER — Telehealth: Payer: Self-pay | Admitting: Neurology

## 2021-09-24 ENCOUNTER — Ambulatory Visit: Payer: Medicare PPO | Admitting: Neurology

## 2021-09-24 ENCOUNTER — Encounter: Payer: Self-pay | Admitting: Neurology

## 2021-09-24 VITALS — BP 107/65 | HR 109 | Ht 69.0 in | Wt 181.0 lb

## 2021-09-24 DIAGNOSIS — R930 Abnormal findings on diagnostic imaging of skull and head, not elsewhere classified: Secondary | ICD-10-CM | POA: Diagnosis not present

## 2021-09-24 DIAGNOSIS — M4802 Spinal stenosis, cervical region: Secondary | ICD-10-CM

## 2021-09-24 DIAGNOSIS — I639 Cerebral infarction, unspecified: Secondary | ICD-10-CM

## 2021-09-24 NOTE — Telephone Encounter (Signed)
Can you check on status of HST? She didn't get around to scheduling.

## 2021-09-24 NOTE — Patient Instructions (Signed)
Check MRI of the brain  Check kidney function  See you back in 6 months

## 2021-09-25 ENCOUNTER — Ambulatory Visit
Admission: RE | Admit: 2021-09-25 | Discharge: 2021-09-25 | Disposition: A | Discharge status: Home / Self Care | Payer: Medicare PPO | Source: Ambulatory Visit | Attending: Obstetrics and Gynecology | Admitting: Obstetrics and Gynecology

## 2021-09-25 DIAGNOSIS — Z1231 Encounter for screening mammogram for malignant neoplasm of breast: Secondary | ICD-10-CM | POA: Diagnosis not present

## 2021-09-25 LAB — CREATININE, SERUM
Creatinine, Ser: 1.46 mg/dL — ABNORMAL HIGH (ref 0.57–1.00)
eGFR: 42 mL/min/{1.73_m2} — ABNORMAL LOW (ref >59)

## 2021-09-29 ENCOUNTER — Telehealth: Payer: Self-pay | Admitting: Neurology

## 2021-09-29 DIAGNOSIS — R930 Abnormal findings on diagnostic imaging of skull and head, not elsewhere classified: Secondary | ICD-10-CM

## 2021-09-29 NOTE — Telephone Encounter (Signed)
Relayed message to pt, she appreciated call,  her pcp will recheck labs.  Pt awaiting MRI schedule date.

## 2021-09-29 NOTE — Telephone Encounter (Signed)
Craig Staggers: [id number] exp. 09/29/21-10/29/21 sent to GI

## 2021-09-29 NOTE — Telephone Encounter (Signed)
Please see telephone note

## 2021-09-29 NOTE — Telephone Encounter (Signed)
Please call the patient, creatinine was 1.46, eGFR was 42, this is increased from about 6 months ago.  I am going to change the MRI to without contrast.  I would have her PCP recheck this. Could just be dehydration but with history of only 1 kidney I would follow.  Reviewed this with Dr. Krista Blue.  Orders Placed This Encounter  Procedures   MR BRAIN WO CONTRAST

## 2021-10-06 ENCOUNTER — Other Ambulatory Visit: Payer: Self-pay | Admitting: Family Medicine

## 2021-10-06 DIAGNOSIS — I10 Essential (primary) hypertension: Secondary | ICD-10-CM

## 2021-10-07 NOTE — Telephone Encounter (Signed)
Updated Craig Staggers: [id number] (exp. 11/08/21 to 02/06/22).  Left voicemail for patient to call back to schedule.

## 2021-10-09 DIAGNOSIS — L723 Sebaceous cyst: Secondary | ICD-10-CM | POA: Diagnosis not present

## 2021-10-10 ENCOUNTER — Ambulatory Visit
Admission: RE | Admit: 2021-10-10 | Discharge: 2021-10-10 | Disposition: A | Discharge status: Home / Self Care | Payer: Medicare PPO | Source: Ambulatory Visit | Attending: Neurology | Admitting: Neurology

## 2021-10-10 DIAGNOSIS — R930 Abnormal findings on diagnostic imaging of skull and head, not elsewhere classified: Secondary | ICD-10-CM

## 2021-10-13 ENCOUNTER — Telehealth: Payer: Self-pay | Admitting: Neurology

## 2021-10-13 DIAGNOSIS — R930 Abnormal findings on diagnostic imaging of skull and head, not elsewhere classified: Secondary | ICD-10-CM

## 2021-10-13 NOTE — Telephone Encounter (Signed)
I called the patient, MRI of the brain continues to show areas of advanced chronic microvascular ischemic changes, but demyelination cannot be excluded.  We will proceed with LP for MS workup.     IMPRESSION: This MRI of the brain without contrast shows the following: Extensive single and confluent T2/FLAIR hyperintense foci in the cerebral hemispheres.  These are most consistent with advanced chronic microvascular ischemic changes and sequela of previous strokes.  Demyelination cannot be excluded. 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  It appears unchanged compared to the previous MRI. No acute findings.   Orders Placed This Encounter  Procedures   DG FL GUIDED LUMBAR PUNCTURE    Standing Status:   Future    Standing Expiration Date:   10/14/2022    Order Specific Question:   Lab orders requested (DO NOT place separate lab orders, these will be automatically ordered during procedure specimen collection):    Answer:   CSF cell count w differential    Order Specific Question:   Lab orders requested (DO NOT place separate lab orders, these will be automatically ordered during procedure specimen collection):    Answer:   CSF culture    Order Specific Question:   Lab orders requested (DO NOT place separate lab orders, these will be automatically ordered during procedure specimen collection):    Answer:   Protein and glucose, CSF    Order Specific Question:   Lab orders requested (DO NOT place separate lab orders, these will be automatically ordered during procedure specimen collection):    Answer:   Oligo clonal bands    Order Specific Question:   Reason for Exam (SYMPTOM  OR DIAGNOSIS REQUIRED)    Answer:   questionable MS    Order Specific Question:   Is the patient pregnant?    Answer:   No    Order Specific Question:   Preferred Imaging Location?    Answer:   [facility]   MS Profile+MBP, CSF + Blood    Please ensure this MS panel includes: CSF cell count with  diff, protein, glucose  CSF, oligoclonal bands

## 2021-10-14 DIAGNOSIS — N1831 Chronic kidney disease, stage 3a: Secondary | ICD-10-CM | POA: Diagnosis not present

## 2021-10-14 DIAGNOSIS — I129 Hypertensive chronic kidney disease with stage 1 through stage 4 chronic kidney disease, or unspecified chronic kidney disease: Secondary | ICD-10-CM | POA: Diagnosis not present

## 2021-10-14 DIAGNOSIS — N2581 Secondary hyperparathyroidism of renal origin: Secondary | ICD-10-CM | POA: Diagnosis not present

## 2021-10-14 DIAGNOSIS — N182 Chronic kidney disease, stage 2 (mild): Secondary | ICD-10-CM | POA: Diagnosis not present

## 2021-10-14 DIAGNOSIS — D631 Anemia in chronic kidney disease: Secondary | ICD-10-CM | POA: Diagnosis not present

## 2021-10-20 ENCOUNTER — Telehealth: Payer: Self-pay | Admitting: Neurology

## 2021-10-20 NOTE — Telephone Encounter (Signed)
I received labs from [facility], [place], [place].  Collected 10/14/2021, creatinine 1.52, BUN 26. Extensive urine studies.

## 2021-10-21 ENCOUNTER — Other Ambulatory Visit: Payer: Self-pay | Admitting: Nephrology

## 2021-10-21 DIAGNOSIS — N1831 Chronic kidney disease, stage 3a: Secondary | ICD-10-CM

## 2021-10-26 ENCOUNTER — Other Ambulatory Visit: Payer: Medicare PPO

## 2021-10-28 ENCOUNTER — Ambulatory Visit
Admission: RE | Admit: 2021-10-28 | Discharge: 2021-10-28 | Disposition: A | Discharge status: Home / Self Care | Payer: Medicare PPO | Source: Ambulatory Visit | Attending: Neurology | Admitting: Neurology

## 2021-10-28 ENCOUNTER — Ambulatory Visit
Admission: RE | Admit: 2021-10-28 | Discharge: 2021-10-28 | Disposition: A | Discharge status: Home / Self Care | Payer: Medicare PPO | Source: Ambulatory Visit | Attending: Nephrology | Admitting: Nephrology

## 2021-10-28 VITALS — BP 119/57 | HR 92

## 2021-10-28 DIAGNOSIS — Z905 Acquired absence of kidney: Secondary | ICD-10-CM | POA: Diagnosis not present

## 2021-10-28 DIAGNOSIS — N189 Chronic kidney disease, unspecified: Secondary | ICD-10-CM | POA: Diagnosis not present

## 2021-10-28 DIAGNOSIS — R930 Abnormal findings on diagnostic imaging of skull and head, not elsewhere classified: Secondary | ICD-10-CM | POA: Diagnosis not present

## 2021-10-28 DIAGNOSIS — R9402 Abnormal brain scan: Secondary | ICD-10-CM | POA: Diagnosis not present

## 2021-10-28 DIAGNOSIS — N1831 Chronic kidney disease, stage 3a: Secondary | ICD-10-CM

## 2021-10-28 NOTE — Discharge Instructions (Signed)

## 2021-10-28 NOTE — Progress Notes (Signed)
1 vial of blood drawn from pts R Hand to be sent off with LP lab work. 1 successful attempt, pt tolerated well. Gauze and tape applied after.

## 2021-11-03 LAB — CSF CULTURE W GRAM STAIN
MICRO NUMBER:: 14067462
Result:: NO GROWTH
SPECIMEN QUALITY:: ADEQUATE

## 2021-11-03 LAB — CSF CELL COUNT WITH DIFFERENTIAL
RBC Count, CSF: 8 cells/uL — ABNORMAL HIGH
TOTAL NUCLEATED CELL: 1 cells/uL (ref 0–5)

## 2021-11-03 LAB — GLUCOSE, CSF: Glucose, CSF: 59 mg/dL (ref 40–80)

## 2021-11-03 LAB — OLIGOCLONAL BANDS, CSF + SERM

## 2021-11-03 LAB — PROTEIN, CSF: Total Protein, CSF: 52 mg/dL — ABNORMAL HIGH (ref 15–45)

## 2021-11-04 ENCOUNTER — Encounter: Payer: Self-pay | Admitting: Neurology

## 2021-11-04 ENCOUNTER — Telehealth: Payer: Self-pay | Admitting: Neurology

## 2021-11-04 NOTE — Telephone Encounter (Signed)
No openings at this time, will try to work pt in.

## 2021-11-04 NOTE — Telephone Encounter (Signed)
I called the patient, [name] was positive for oligoclonal bands. Patient will come in to discuss plan moving forward with Dr. Krista Blue. Her results are concerning for MS. I sent her a my chart message suggesting she do some research on potential medication options including Zeposia, Tysabri, Ocrevus.

## 2021-11-04 NOTE — Telephone Encounter (Signed)
I called pt and made appt for her 11-19-2021 at 0900 with Dr. Krista Blue, per SS/NP to discuss treatment options for MS. Per verbalized understanding.

## 2021-11-05 ENCOUNTER — Telehealth: Payer: Self-pay

## 2021-11-05 NOTE — Telephone Encounter (Signed)
Labs collected 10/28/21, placed on NP desk for review.

## 2021-11-18 DIAGNOSIS — L304 Erythema intertrigo: Secondary | ICD-10-CM | POA: Diagnosis not present

## 2021-11-18 DIAGNOSIS — L209 Atopic dermatitis, unspecified: Secondary | ICD-10-CM | POA: Diagnosis not present

## 2021-11-19 ENCOUNTER — Ambulatory Visit: Payer: Medicare PPO | Admitting: Neurology

## 2021-11-19 ENCOUNTER — Encounter: Payer: Self-pay | Admitting: Neurology

## 2021-11-19 VITALS — BP 121/65 | HR 111 | Ht 69.0 in | Wt 183.0 lb

## 2021-11-19 DIAGNOSIS — N882 Stricture and stenosis of cervix uteri: Secondary | ICD-10-CM

## 2021-11-19 DIAGNOSIS — G35 Multiple sclerosis: Secondary | ICD-10-CM | POA: Diagnosis not present

## 2021-11-19 DIAGNOSIS — I639 Cerebral infarction, unspecified: Secondary | ICD-10-CM | POA: Diagnosis not present
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
## 2021-11-19 NOTE — Patient Instructions (Addendum)
Aubagio (teriflunomide)  Dimethyl Fumarate (dimethyl fumarate - generic equivalent of Tecfidera)  Gilenya (fingolimod) Mayzent (siponimod)   Ocrevus (ocrelizumab)  Tysabri (natalizumab)

## 2021-11-27 ENCOUNTER — Ambulatory Visit (INDEPENDENT_AMBULATORY_CARE_PROVIDER_SITE_OTHER): Payer: Medicare PPO | Admitting: Family Medicine

## 2021-11-27 ENCOUNTER — Encounter: Payer: Self-pay | Admitting: Family Medicine

## 2021-11-27 VITALS — BP 125/51 | HR 109 | Ht 69.0 in | Wt 185.6 lb

## 2021-11-27 DIAGNOSIS — Z114 Encounter for screening for human immunodeficiency virus [HIV]: Secondary | ICD-10-CM | POA: Diagnosis not present

## 2021-11-27 DIAGNOSIS — Z1211 Encounter for screening for malignant neoplasm of colon: Secondary | ICD-10-CM | POA: Diagnosis not present

## 2021-11-27 DIAGNOSIS — R7303 Prediabetes: Secondary | ICD-10-CM

## 2021-11-27 NOTE — Patient Instructions (Addendum)
It was great to see you today! Thank you for choosing [facility] for your primary care. [name] was seen for annual physical.  Today we addressed: Smoking - we discussed quitting. You will start using the patches and the gum. In 2 months we will talk about how it went and considering medication and a lung cancer screening CT.  You will have your pap smear at your next visit!  Please continue your blood pressure medications  Prediabetes - we can talk about this at your next appointment as well.   If you haven't already, sign up for My Chart to have easy access to your labs results, and communication with your primary care physician.  We are checking some labs today. If they are abnormal, I will call you. If they are normal, I will send you a MyChart message (if it is active) or a letter in the mail. If you do not hear about your labs in the next 2 weeks, please call the office.  You should return to our clinic In two months on January 4th at 4:10  Please arrive 15 minutes before your appointment to ensure smooth check in process.  We appreciate your efforts in making this happen.  Thank you for allowing me to participate in your care, Lowry Ram, MD 11/27/2021, 4:14 PM PGY-1, [place]
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
## 2021-11-28 LAB — HIV ANTIBODY (ROUTINE TESTING W REFLEX): HIV Screen 4th Generation wRfx: NONREACTIVE

## 2021-12-07 ENCOUNTER — Other Ambulatory Visit: Payer: Self-pay | Admitting: Family Medicine

## 2021-12-07 DIAGNOSIS — I10 Essential (primary) hypertension: Secondary | ICD-10-CM

## 2021-12-08 ENCOUNTER — Telehealth: Payer: Self-pay | Admitting: Neurology

## 2021-12-08 ENCOUNTER — Encounter: Payer: Self-pay | Admitting: Neurology

## 2021-12-08 LAB — COMPREHENSIVE METABOLIC PANEL
ALT: 14 IU/L (ref 0–32)
AST: 18 IU/L (ref 0–40)
Albumin/Globulin Ratio: 1.7 (ref 1.2–2.2)
Albumin: 4.9 g/dL (ref 3.8–4.9)
Alkaline Phosphatase: 105 IU/L (ref 44–121)
BUN/Creatinine Ratio: 15 (ref 9–23)
BUN: 21 mg/dL (ref 6–24)
Bilirubin Total: 0.3 mg/dL (ref 0.0–1.2)
CO2: 24 mmol/L (ref 20–29)
Calcium: 10 mg/dL (ref 8.7–10.2)
Chloride: 103 mmol/L (ref 96–106)
Creatinine, Ser: 1.44 mg/dL — ABNORMAL HIGH (ref 0.57–1.00)
Globulin, Total: 2.9 g/dL (ref 1.5–4.5)
Glucose: 118 mg/dL — ABNORMAL HIGH (ref 70–99)
Potassium: 4.4 mmol/L (ref 3.5–5.2)
Sodium: 144 mmol/L (ref 134–144)
Total Protein: 7.8 g/dL (ref 6.0–8.5)
eGFR: 42 mL/min/{1.73_m2} — ABNORMAL LOW (ref >59)

## 2021-12-08 LAB — CBC WITH DIFFERENTIAL
Basophils Absolute: 0 10*3/uL (ref 0.0–0.2)
Basos: 1 %
EOS (ABSOLUTE): 0.1 10*3/uL (ref 0.0–0.4)
Eos: 2 %
Hematocrit: 40.5 % (ref 34.0–46.6)
Hemoglobin: 13.2 g/dL (ref 11.1–15.9)
Immature Grans (Abs): 0 10*3/uL (ref 0.0–0.1)
Immature Granulocytes: 0 %
Lymphocytes Absolute: 3.9 10*3/uL — ABNORMAL HIGH (ref 0.7–3.1)
Lymphs: 44 %
MCH: 26.8 pg (ref 26.6–33.0)
MCHC: 32.6 g/dL (ref 31.5–35.7)
MCV: 82 fL (ref 79–97)
Monocytes Absolute: 0.4 10*3/uL (ref 0.1–0.9)
Monocytes: 5 %
Neutrophils Absolute: 4.3 10*3/uL (ref 1.4–7.0)
Neutrophils: 48 %
RBC: 4.92 x10E6/uL (ref 3.77–5.28)
RDW: 14.2 % (ref 11.7–15.4)
WBC: 8.8 10*3/uL (ref 3.4–10.8)

## 2021-12-08 LAB — STRATIFY JCV(TM) AB W/INDEX
JCV Antibody by Inhibition: NEGATIVE
JCV Index Value: 0.3

## 2021-12-08 LAB — HGB A1C W/O EAG: Hgb A1c MFr Bld: 6.2 % — ABNORMAL HIGH (ref 4.8–5.6)

## 2021-12-08 LAB — HEPATITIS B SURFACE ANTIBODY,QUALITATIVE: Hep B Surface Ab, Qual: NONREACTIVE

## 2021-12-08 LAB — VITAMIN D 25 HYDROXY (VIT D DEFICIENCY, FRACTURES): Vit D, 25-Hydroxy: 23.3 ng/mL — ABNORMAL LOW (ref 30.0–100.0)

## 2021-12-08 LAB — HEPATITIS B CORE ANTIBODY, TOTAL: Hep B Core Total Ab: NEGATIVE

## 2021-12-08 LAB — HEPATITIS B SURFACE ANTIGEN: Hepatitis B Surface Ag: NEGATIVE

## 2021-12-08 LAB — HEPATITIS C ANTIBODY: Hep C Virus Ab: NONREACTIVE

## 2021-12-08 NOTE — Telephone Encounter (Signed)
Please call patient, laboratory evaluation showed JC virus titer, also check with patient if she gets a chance to discuss with her daughter, who does neuroscience research, would able to decide on which long-term immunomodulation therapy for multiple sclerosis,     Had extensive discussion with patient, her sister and her daughter, provide treatment options including p.o. medications, Aubagio, Gilenya, Tecfidera, versus IV infusion Tysarbri, ocrelizumab   If she has decided on the treatment option, we can proceed with the medication of her choice,  If she needs more time for discussion, can wait till March 2024 at her previously scheduled appointment, we will set up a virtual visit with her and her daughter.  --A1c is mildly elevated, 6.2, prediabetic level, moderate exercise, diet control --- Mildly decreased vitamin D 23.3, she would benefit over-the-counter supplement, vitamin D3 1000 units daily --- Mild abnormal kidney function, creatinine 1.44, GFR 42, she would benefit increase water intake

## 2021-12-16 ENCOUNTER — Telehealth: Payer: Medicare PPO | Admitting: Neurology

## 2021-12-16 DIAGNOSIS — G35 Multiple sclerosis: Secondary | ICD-10-CM

## 2021-12-16 NOTE — Telephone Encounter (Signed)
Patient decide on Ocrelizumab, please finish the paper work via intra fusion and ask her to sign to start the prior authorization process
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
## 2021-12-17 ENCOUNTER — Other Ambulatory Visit (INDEPENDENT_AMBULATORY_CARE_PROVIDER_SITE_OTHER): Payer: Self-pay

## 2021-12-17 DIAGNOSIS — Z0289 Encounter for other administrative examinations: Secondary | ICD-10-CM

## 2021-12-17 DIAGNOSIS — R799 Abnormal finding of blood chemistry, unspecified: Secondary | ICD-10-CM | POA: Diagnosis not present

## 2021-12-17 DIAGNOSIS — G35 Multiple sclerosis: Secondary | ICD-10-CM | POA: Diagnosis not present

## 2021-12-21 ENCOUNTER — Other Ambulatory Visit: Payer: Self-pay | Admitting: *Deleted

## 2021-12-21 ENCOUNTER — Encounter: Payer: Self-pay | Admitting: Neurology

## 2021-12-22 ENCOUNTER — Telehealth: Payer: Self-pay

## 2021-12-22 LAB — CBC WITH DIFFERENTIAL/PLATELET
Basophils Absolute: 0 10*3/uL (ref 0.0–0.2)
Basos: 0 %
EOS (ABSOLUTE): 0.1 10*3/uL (ref 0.0–0.4)
Eos: 2 %
Hematocrit: 37.3 % (ref 34.0–46.6)
Hemoglobin: 12.2 g/dL (ref 11.1–15.9)
Immature Grans (Abs): 0 10*3/uL (ref 0.0–0.1)
Immature Granulocytes: 0 %
Lymphocytes Absolute: 3.7 10*3/uL — ABNORMAL HIGH (ref 0.7–3.1)
Lymphs: 42 %
MCH: 26.5 pg — ABNORMAL LOW (ref 26.6–33.0)
MCHC: 32.7 g/dL (ref 31.5–35.7)
MCV: 81 fL (ref 79–97)
Monocytes Absolute: 0.4 10*3/uL (ref 0.1–0.9)
Monocytes: 4 %
Neutrophils Absolute: 4.6 10*3/uL (ref 1.4–7.0)
Neutrophils: 52 %
Platelets: 279 10*3/uL (ref 150–450)
RBC: 4.6 x10E6/uL (ref 3.77–5.28)
RDW: 13.6 % (ref 11.7–15.4)
WBC: 8.9 10*3/uL (ref 3.4–10.8)

## 2021-12-22 LAB — VARICELLA ZOSTER ANTIBODY, IGG: Varicella zoster IgG: 3557 index (ref >165)

## 2021-12-22 LAB — QUANTIFERON-TB GOLD PLUS
QuantiFERON Mitogen Value: >10 IU/mL
QuantiFERON Nil Value: 0.06 IU/mL
QuantiFERON TB1 Ag Value: 0.07 IU/mL
QuantiFERON TB2 Ag Value: 0.06 IU/mL
QuantiFERON-TB Gold Plus: NEGATIVE

## 2021-12-22 MED ORDER — ATORVASTATIN CALCIUM 80 MG PO TABS: 80.0000 mg | ORAL_TABLET | Freq: Every day | ORAL | 2 refills | Status: DC

## 2021-12-22 NOTE — Telephone Encounter (Signed)
Faxed Ocrevus Start form to [facility]. Received a receipt of confirmation.  Patient needs immunoglobulin labs completed prior to starting.

## 2021-12-22 NOTE — Addendum Note (Signed)
Addended by: Lester Brimhall Nizhoni A on: 12/22/2021 02:53 PM   Modules accepted: Orders

## 2021-12-24 DIAGNOSIS — N1831 Chronic kidney disease, stage 3a: Secondary | ICD-10-CM | POA: Diagnosis not present

## 2021-12-24 DIAGNOSIS — I129 Hypertensive chronic kidney disease with stage 1 through stage 4 chronic kidney disease, or unspecified chronic kidney disease: Secondary | ICD-10-CM | POA: Diagnosis not present

## 2021-12-24 DIAGNOSIS — N2581 Secondary hyperparathyroidism of renal origin: Secondary | ICD-10-CM | POA: Diagnosis not present

## 2021-12-24 DIAGNOSIS — D631 Anemia in chronic kidney disease: Secondary | ICD-10-CM | POA: Diagnosis not present

## 2021-12-31 ENCOUNTER — Other Ambulatory Visit (INDEPENDENT_AMBULATORY_CARE_PROVIDER_SITE_OTHER): Payer: Self-pay

## 2021-12-31 DIAGNOSIS — Z0289 Encounter for other administrative examinations: Secondary | ICD-10-CM

## 2021-12-31 DIAGNOSIS — G35 Multiple sclerosis: Secondary | ICD-10-CM

## 2021-12-31 NOTE — Telephone Encounter (Signed)
Noted, thank you

## 2021-12-31 NOTE — Telephone Encounter (Signed)
Called pt. To informed her that labs are needed. She said she will come by today for lab work

## 2021-12-31 NOTE — Telephone Encounter (Signed)
Pt has not had labs as requested from my chart message.  Please call pt and advise to come by during normal working hours for labs to be completed.  Thanks

## 2022-01-01 LAB — IGG, IGA, IGM
IgA/Immunoglobulin A, Serum: 378 mg/dL — ABNORMAL HIGH (ref 87–352)
IgG (Immunoglobin G), Serum: 1176 mg/dL (ref 586–1602)
IgM (Immunoglobulin M), Srm: 201 mg/dL (ref 26–217)

## 2022-01-06 ENCOUNTER — Encounter: Payer: Self-pay | Admitting: Neurology

## 2022-01-06 NOTE — Telephone Encounter (Signed)
Labs completed and results faxed to [facility]. Start order given to intrafusion [name] and [name]) for processing.

## 2022-01-14 ENCOUNTER — Ambulatory Visit: Payer: Self-pay | Admitting: Family Medicine

## 2022-01-14 DIAGNOSIS — M25551 Pain in right hip: Secondary | ICD-10-CM | POA: Diagnosis not present

## 2022-02-04 ENCOUNTER — Other Ambulatory Visit: Payer: Self-pay | Admitting: *Deleted

## 2022-02-04 DIAGNOSIS — Z9889 Other specified postprocedural states: Secondary | ICD-10-CM

## 2022-02-11 DIAGNOSIS — G35 Multiple sclerosis: Secondary | ICD-10-CM | POA: Diagnosis not present

## 2022-02-15 ENCOUNTER — Ambulatory Visit (INDEPENDENT_AMBULATORY_CARE_PROVIDER_SITE_OTHER): Payer: Medicare PPO

## 2022-02-15 DIAGNOSIS — Z0000 Encounter for general adult medical examination without abnormal findings: Secondary | ICD-10-CM | POA: Diagnosis not present

## 2022-02-15 NOTE — Progress Notes (Signed)
I connected with  [name] on 02/15/22 by a audio enabled telemedicine application and verified that I am speaking with the correct person using two identifiers.  Patient Location: Home  Provider Location: Office/Clinic  I discussed the limitations of evaluation and management by telemedicine. The patient expressed understanding and agreed to proceed.  Subjective:   [name] is a 57 y.o. female who presents for Medicare Annual (Subsequent) preventive examination.  Review of Systems    Per HPI unless specifically indicated below.  Cardiac Risk Factors include: advanced age (>37mn, >>84women);female gender, Hypertension, and History of Cerebrovascular accident.           Objective:       11/27/2021    3:26 PM 11/19/2021    9:00 AM 10/28/2021   10:36 AM  Vitals with BMI  Height '5\' 9"'$  '5\' 9"'$    Weight 185 lbs 10 oz 183 lbs   BMI 244.0234.74  Systolic [id number] Diastolic 51 65 57  Pulse [id number]    There were no vitals filed for this visit. There is no height or weight on file to calculate BMI.     02/15/2022    3:54 PM 11/27/2021    3:27 PM 05/27/2021    4:41 PM 03/18/2021    3:32 PM 03/11/2021    2:50 PM 02/25/2021    7:51 AM 02/23/2021    3:14 PM  Advanced Directives  Does Patient Have a Medical Advance Directive? No No No No No No No  Would patient like information on creating a medical advance directive? No - Patient declined No - Patient declined No - Patient declined No - Patient declined No - Patient declined No - Patient declined     Current Medications (verified) Outpatient Encounter Medications as of 02/15/2022  Medication Sig   albuterol (VENTOLIN HFA) 108 (90 Base) MCG/ACT inhaler TAKE 2 PUFFS BY MOUTH EVERY 6 HOURS AS NEEDED FOR WHEEZE   aspirin EC 81 MG EC tablet Take 1 tablet (81 mg total) by mouth daily. Swallow whole.   atorvastatin (LIPITOR) 80 MG tablet Take 1 tablet (80 mg total) by mouth daily.   JARDIANCE 10 MG TABS tablet Take 10  mg by mouth daily.   lamoTRIgine (LAMICTAL) 200 MG tablet Take 1 tablet (200 mg total) by mouth at bedtime.   nicotine polacrilex (NICORETTE) 2 MG gum Take 1 each (2 mg total) by mouth as needed for smoking cessation.   Olmesartan-amLODIPine-HCTZ 20-5-12.5 MG TABS TAKE 1 TABLET BY MOUTH EVERY DAY   oxyCODONE (OXY IR/ROXICODONE) 5 MG immediate release tablet Take 1 tablet (5 mg total) by mouth every 4 (four) hours as needed for moderate pain ((score 4 to 6)).   Polyethyl Glycol-Propyl Glycol (SYSTANE OP) Place 1 drop into both eyes in the morning.   traZODone (DESYREL) 100 MG tablet TAKE 1 TABLET BY MOUTH EVERYDAY AT BEDTIME   No facility-administered encounter medications on file as of 02/15/2022.    Allergies (verified) Iohexol, Morphine and related, Hydrocodone, Diazepam, and Sulfa antibiotics   History: Past Medical History:  Diagnosis Date   Arthritis    Bipolar 1 disorder ([place]    Chronic kidney disease    donated left kidney   Heart murmur    Hypertension    Multiple sclerosis ([place]    Pneumonia 2010   had 2 episodes/ was sent home on O2   Prediabetes    [place]   Only  1 clot, not on lifelong anticoagulation   Stroke [facility])    TIA   Past Surgical History:  Procedure Laterality Date   ANTERIOR CERVICAL DECOMP/DISCECTOMY FUSION N/A 02/25/2021   Procedure: Anterior Cervical Discectomy Fusion - Cervical Three-Cervical Four;  Surgeon: Eustace Moore, MD;  Location: [place];  Service: Neurosurgery;  Laterality: N/A;  Anterior Cervical Discectomy Fusion - Cervical Three-Cervical Four   APPENDECTOMY  2001   ENDARTERECTOMY Left 08/20/2020   Procedure: LEFT CAROTID ARTERY ENDARTERECTOMY;  Surgeon: Cherre Robins, MD;  Location: [place];  Service: Vascular;  Laterality: Left;   Endometriosis s/p left fallopian tube removal.  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   PATCH ANGIOPLASTY Left 08/20/2020   Procedure: PATCH ANGIOPLASTY WITH 1x6  Weyman Pedro;  Surgeon: Cherre Robins, MD;  Location: [facility];  Service: Vascular;  Laterality: Left;   TONSILLECTOMY     Family History  Problem Relation Age of Onset   Breast cancer Mother    CVA Mother    Diabetes Sister    Kidney disease Sister    Hodgkin's lymphoma Sister    Breast cancer Maternal Grandmother    Colon cancer Paternal Grandmother    Breast cancer Maternal Aunt    Social History   Socioeconomic History   Marital status: Divorced    Spouse name: Not on file   Number of children: 2   Years of education: Not on file   Highest education level: Not on file  Occupational History   Occupation: Part time  Tobacco Use   Smoking status: Every Day    Packs/day: 0.50    Years: 36.00    Total pack years: 18.00    Types: Cigarettes   Smokeless tobacco: Never  Vaping Use   Vaping Use: Former  Substance and Sexual Activity   Alcohol use: Yes    Comment: occassionally   Drug use: No   Sexual activity: Yes    Birth control/protection: Condom    Comment: Partner with Vasectomy  Other Topics Concern   Not on file  Social History Narrative   Not on file   Social Determinants of Health   Financial Resource Strain: Low Risk  (02/15/2022)   Overall Financial Resource Strain (CARDIA)    Difficulty of Paying Living Expenses: Not hard at all  Food Insecurity: No Food Insecurity (02/15/2022)   Hunger Vital Sign    Worried About Running Out of Food in the Last Year: Never true    Ran Out of Food in the Last Year: Never true  Transportation Needs: No Transportation Needs (02/15/2022)   PRAPARE - Hydrologist (Medical): No    Lack of Transportation (Non-Medical): No  Physical Activity: Insufficiently Active (02/15/2022)   Exercise Vital Sign    Days of Exercise per Week: 2 days    Minutes of Exercise per Session: 30 min  Stress: No Stress Concern Present (02/15/2022)   [place]     Feeling of Stress : Not at all  Social Connections: [place] (02/15/2022)   Social Connection and Isolation Panel [NHANES]    Frequency of Communication with Friends and Family: More than three times a week    Frequency of Social Gatherings with Friends and Family: More than three times a week    Attends Religious Services: More than 4 times per year    Active Member of [employer] or Organizations:  Yes    Attends Club or Organization Meetings: 1 to 4 times per year    Marital Status: Living with partner    Tobacco Counseling Ready to quit: Not Answered Counseling given: No   Clinical Intake:  Pre-visit preparation completed: No        Nutritional Status: BMI 25 -29 Overweight Nutritional Risks: None Diabetes: No  How often do you need to have someone help you when you read instructions, pamphlets, or other written materials from your doctor or pharmacy?: 1 - Never  Diabetic?No  Interpreter Needed?: No  Information entered by :: Donnie Mesa, CMA   Activities of Daily Living    02/15/2022    3:38 PM 02/23/2021    3:18 PM  In your present state of health, do you have any difficulty performing the following activities:  Hearing? 0   Vision? 0   Comment [facility]   Difficulty concentrating or making decisions? 0   Walking or climbing stairs? 0   Dressing or bathing? 0   Doing errands, shopping? 0 0    Patient Care Team: Lowry Ram, MD as PCP - General (Family Medicine) Buford Dresser, MD as PCP - Cardiology (Cardiology)  Indicate any recent Medical Services you may have received from other than [facility] providers in the past year (date may be approximate).    The pt was seen at [facility] on 01/14/22 for Pain in Rt hip joint. Assessment:   This is a routine wellness examination for Ela.  Hearing/Vision screen Denies any vision loss. Denies any vision changes. Overdue for Annual Eye Exam, Appt scheduled with [facility].  Dietary  issues and exercise activities discussed: Current Exercise Habits: Structured exercise class, Type of exercise: walking, Time (Minutes): 30, Frequency (Times/Week): 2, Weekly Exercise (Minutes/Week): 60, Intensity: Moderate, Exercise limited by: None identified   Goals Addressed   None    Depression Screen    02/15/2022    3:37 PM 03/18/2021    3:32 PM 03/11/2021    2:48 PM 11/26/2020   11:06 AM 11/26/2019   10:58 AM 01/23/2019    3:55 PM 12/01/2018    3:20 PM  PHQ 2/9 Scores  PHQ - 2 Score 0 0 0 0 0 0 0  PHQ- 9 Score  0 0 0 0      Fall Risk    02/15/2022    3:38 PM 03/18/2021    3:32 PM 03/11/2021    2:48 PM 11/26/2020   11:06 AM 01/23/2019    3:55 PM  [place] in the past year? 0 0 0 0 0  Number falls in past yr: 0 0 0 0   Injury with Fall? 0 0 0 0   Risk for fall due to : No Fall Risks        FALL RISK PREVENTION PERTAINING TO THE HOME:  Any stairs in or around the home? No  If so, are there any without handrails? No  Home free of loose throw rugs in walkways, pet beds, electrical cords, etc? Yes  Adequate lighting in your home to reduce risk of falls? Yes   ASSISTIVE DEVICES UTILIZED TO PREVENT FALLS:  Life alert? No  Use of a cane, walker or w/c? No  Grab bars in the bathroom? Yes  Shower chair or bench in shower? Yes  Elevated toilet seat or a handicapped toilet? No   TIMED UP AND GO:  Was the test performed?  unable to perform, virtual appt .  Cognitive Function:      11/19/2021    9:00 AM  Montreal Cognitive Assessment   Visuospatial/ Executive (0/5) 3  Naming (0/3) 3  Attention: Read list of digits (0/2) 2  Attention: Read list of letters (0/1) 1  Attention: Serial 7 subtraction starting at 100 (0/3) 2  Language: Repeat phrase (0/2) 2  Language : Fluency (0/1) 1  Abstraction (0/2) 2  Delayed Recall (0/5) 3  Orientation (0/6) 6  Total 25      02/15/2022    3:39 PM  6CIT Screen  What Year? 0 points  What month? 0 points  What time? 0 points   Count back from 20 0 points  Months in reverse 0 points  Repeat phrase 2 points  Total Score 2 points    Immunizations Immunization History  Administered Date(s) Administered   Influenza Split 10/07/2011, 11/10/2012   Influenza,inj,Quad PF,6+ Mos 09/28/2013, 10/17/2014, 11/25/2015, 11/26/2016, 10/13/2017, 10/12/2018, 10/17/2021   Influenza,inj,quad, With Preservative 10/11/2016   Influenza-Unspecified 10/12/2018, 08/12/2019, 10/10/2020   PFIZER Comirnaty(Gray Top)Covid-19 Tri-Sucrose Vaccine 10/31/2020, 10/17/2021   PFIZER(Purple Top)SARS-COV-2 Vaccination 03/17/2019, 04/07/2019, 11/07/2019, 05/10/2020   PNEUMOCOCCAL CONJUGATE-20 11/26/2020   Pneumococcal Polysaccharide-23 05/26/2006, 10/13/2017   Zoster Recombinat (Shingrix) 03/22/2021    TDAP status: Due, Education has been provided regarding the importance of this vaccine. Advised may receive this vaccine at local pharmacy or Health Dept. Aware to provide a copy of the vaccination record if obtained from local pharmacy or Health Dept. Verbalized acceptance and understanding.  Flu Vaccine status: Up to date  Pneumococcal vaccine status: Up to date  Covid-19 vaccine status: Information provided on how to obtain vaccines.   Qualifies for Shingles Vaccine? Yes   Zostavax completed Yes   Shingrix Completed?: No.    Education has been provided regarding the importance of this vaccine. Patient has been advised to call insurance company to determine out of pocket expense if they have not yet received this vaccine. Advised may also receive vaccine at local pharmacy or Health Dept. Verbalized acceptance and understanding.  Screening Tests Health Maintenance  Topic Date Due   DTaP/Tdap/Td (1 - Tdap) Never done   PAP SMEAR-Modifier  11/22/2020   Zoster Vaccines- Shingrix (2 of 2) 05/17/2021   COVID-19 Vaccine (7 - 2023-24 season) 12/12/2021   COLONOSCOPY (Pts 45-67yr Insurance coverage will need to be confirmed)  01/12/2023  (Originally 02/02/2021)   Medicare Annual Wellness (AWV)  02/16/2023   MAMMOGRAM  09/26/2023   INFLUENZA VACCINE  Completed   Hepatitis C Screening  Completed   HIV Screening  Completed   HPV VACCINES  Aged Out    Health Maintenance  Health Maintenance Due  Topic Date Due   DTaP/Tdap/Td (1 - Tdap) Never done   PAP SMEAR-Modifier  11/22/2020   Zoster Vaccines- Shingrix (2 of 2) 05/17/2021   COVID-19 Vaccine (7 - 2023-24 season) 12/12/2021    Colorectal cancer screening: Type of screening: Colonoscopy. Completed 02/03/2016. Repeat every 7 years  Mammogram status: Completed 09/25/2021. Repeat every year  DEXA Scan: not applicable   Lung Cancer Screening: (Low Dose CT Chest recommended if Age 58-80years, 30 pack-year currently smoking OR have quit w/in 15years.) does not qualify.   Lung Cancer Screening Referral: not applicable   Additional Screening:  Hepatitis C Screening: does qualify; Completed 11/19/2021  Vision Screening: Recommended annual ophthalmology exams for early detection of glaucoma and other disorders of the eye. Is the patient up to date with their annual eye exam?  No Who is the  provider or what is the name of the office in which the patient attends annual eye exams? Dr. Schuyler Amor, appt scheduled at the end of the month If pt is not established with a provider, would they like to be referred to a provider to establish care? No .   Dental Screening: Recommended annual dental exams for proper oral hygiene  Community Resource Referral / Chronic Care Management: CRR required this visit?  No   CCM required this visit?  No      Plan:     I have personally reviewed and noted the following in the patient's chart:   Medical and social history Use of alcohol, tobacco or illicit drugs  Current medications and supplements including opioid prescriptions. Patient is currently taking opioid prescriptions. Information provided to patient regarding non-opioid  alternatives. Patient advised to discuss non-opioid treatment plan with their provider. Functional ability and status Nutritional status Physical activity Advanced directives List of other physicians Hospitalizations, surgeries, and ER visits in previous 12 months Vitals Screenings to include cognitive, depression, and falls Referrals and appointments  In addition, I have reviewed and discussed with patient certain preventive protocols, quality metrics, and best practice recommendations. A written personalized care plan for preventive services as well as general preventive health recommendations were provided to patient.    Ms. Shams , Thank you for taking time to come for your Medicare Wellness Visit. I appreciate your ongoing commitment to your health goals. Please review the following plan we discussed and let me know if I can assist you in the future.   These are the goals we discussed:  Goals      Quit smoking / using tobacco     Quit date in February 2016         This is a list of the screening recommended for you and due dates:  Health Maintenance  Topic Date Due   DTaP/Tdap/Td vaccine (1 - Tdap) Never done   Pap Smear  11/22/2020   Zoster (Shingles) Vaccine (2 of 2) 05/17/2021   COVID-19 Vaccine (7 - 2023-24 season) 12/12/2021   Colon Cancer Screening  01/12/2023*   Medicare Annual Wellness Visit  02/16/2023   Mammogram  09/26/2023   Flu Shot  Completed   Hepatitis C Screening: USPSTF Recommendation to screen - Ages 18-79 yo.  Completed   HIV Screening  Completed   HPV Vaccine  Aged Out  *Topic was postponed. The date shown is not the original due date.     Wilson Singer, [place]   02/15/2022   Nurse Notes: Approximately 30 minute Non-Face -To-Face Medicare Wellness Visit

## 2022-02-15 NOTE — Patient Instructions (Signed)

## 2022-02-16 ENCOUNTER — Ambulatory Visit (HOSPITAL_COMMUNITY)
Admission: RE | Admit: 2022-02-16 | Discharge: 2022-02-16 | Disposition: A | Discharge status: Home / Self Care | Payer: Medicare PPO | Source: Ambulatory Visit | Attending: Vascular Surgery | Admitting: Vascular Surgery

## 2022-02-16 ENCOUNTER — Ambulatory Visit: Payer: Medicare PPO | Admitting: Vascular Surgery

## 2022-02-16 ENCOUNTER — Encounter: Payer: Self-pay | Admitting: Vascular Surgery

## 2022-02-16 VITALS — BP 123/64 | HR 98 | Temp 98.4°F | Resp 20 | Ht 69.0 in | Wt 186.0 lb

## 2022-02-16 DIAGNOSIS — Z9889 Other specified postprocedural states: Secondary | ICD-10-CM | POA: Diagnosis not present

## 2022-02-16 DIAGNOSIS — I6523 Occlusion and stenosis of bilateral carotid arteries: Secondary | ICD-10-CM

## 2022-02-16 DIAGNOSIS — Z8673 Personal history of transient ischemic attack (TIA), and cerebral infarction without residual deficits: Secondary | ICD-10-CM | POA: Diagnosis not present

## 2022-02-16 NOTE — Progress Notes (Signed)
[facility] [place] PROGRESS NOTE  ASSESSMENT / PLAN: [name] is a 57 y.o. female status post left carotid endarterectomy 08/21/20 for symptomatic left carotid artery stenosis.  Patient has done extremely well postoperatively.  Counseled her to continue aspirin, statin therapies indefinitely.  Her immediate postoperative carotid duplex showed no evidence of stenosis.  Duplex today shows stenosis in the 60 to 79% range.  Thankfully she is asymptomatic.  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  Will rescan her in 1 to 2 months.  SUBJECTIVE: Patient returns to clinic for postop evaluation.  As usual, she has maintained a excellent sense of humor.  We reviewed her test results, and the abnormal reaccumulation of stenosis seen today on duplex.  She has been struggling with a new diagnosis of multiple sclerosis.  OBJECTIVE: BP 123/64 (BP Location: Right Arm, Patient Position: Sitting, Cuff Size: Normal)   Pulse 98   Temp 98.4 F (36.9 C)   Resp 20   Ht '5\' 9"'$  (1.753 m)   Wt 186 lb (84.4 kg)   SpO2 100%   BMI 27.47 kg/m   No acute distress Left neck well-healed Regular rate and rhythm Unlabored breathing     Latest Ref Rng & Units 12/17/2021   10:00 AM 11/19/2021   10:18 AM 02/23/2021    3:32 PM  CBC  WBC 3.4 - 10.8 x10E3/uL 8.9  8.8  10.3   Hemoglobin 11.1 - 15.9 g/dL 12.2  13.2  13.4   Hematocrit 34.0 - 46.6 % 37.3  40.5  42.3   Platelets 150 - 450 x10E3/uL 279   289         Latest Ref Rng & Units 11/19/2021   10:18 AM 09/24/2021    2:48 PM 03/11/2021    3:21 PM  CMP  Glucose 70 - 99 mg/dL 118   89   BUN 6 - 24 mg/dL 21   12   Creatinine 0.57 - 1.00 mg/dL 1.44  1.46  1.22   Sodium 134 - 144 mmol/L 144   144   Potassium 3.5 - 5.2 mmol/L 4.4   4.7   Chloride 96 - 106 mmol/L 103   106   CO2 20 - 29 mmol/L 24   26   Calcium 8.7 - 10.2 mg/dL 10.0   9.2   Total Protein 6.0 - 8.5 g/dL 7.8     Total Bilirubin 0.0 - 1.2 mg/dL 0.3     Alkaline Phos 44 - 121  IU/L 105     AST 0 - 40 IU/L 18     ALT 0 - 32 IU/L 14      Duplex personally reviewed 60 to 79% left carotid artery stenosis on duplex today Minimal right carotid artery stenosis  [name]. Stanford Breed, MD [facility] Phone Number: [phone] 02/16/2022 9:55 PM

## 2022-02-17 NOTE — Telephone Encounter (Signed)
Patient's first Ocrevus infusion was 02/11/2022.

## 2022-02-19 ENCOUNTER — Other Ambulatory Visit: Payer: Self-pay

## 2022-02-19 DIAGNOSIS — I6523 Occlusion and stenosis of bilateral carotid arteries: Secondary | ICD-10-CM

## 2022-02-19 DIAGNOSIS — I63239 Cerebral infarction due to unspecified occlusion or stenosis of unspecified carotid arteries: Secondary | ICD-10-CM

## 2022-02-19 DIAGNOSIS — Z9889 Other specified postprocedural states: Secondary | ICD-10-CM

## 2022-02-23 ENCOUNTER — Ambulatory Visit (HOSPITAL_BASED_OUTPATIENT_CLINIC_OR_DEPARTMENT_OTHER): Payer: Medicare PPO | Admitting: Cardiology

## 2022-02-25 DIAGNOSIS — G35 Multiple sclerosis: Secondary | ICD-10-CM | POA: Diagnosis not present

## 2022-03-03 DIAGNOSIS — H1045 Other chronic allergic conjunctivitis: Secondary | ICD-10-CM | POA: Diagnosis not present

## 2022-03-03 DIAGNOSIS — H0102A Squamous blepharitis right eye, upper and lower eyelids: Secondary | ICD-10-CM | POA: Diagnosis not present

## 2022-03-03 DIAGNOSIS — H0102B Squamous blepharitis left eye, upper and lower eyelids: Secondary | ICD-10-CM | POA: Diagnosis not present

## 2022-03-03 DIAGNOSIS — H2513 Age-related nuclear cataract, bilateral: Secondary | ICD-10-CM | POA: Diagnosis not present

## 2022-03-03 DIAGNOSIS — H35372 Puckering of macula, left eye: Secondary | ICD-10-CM | POA: Diagnosis not present

## 2022-03-05 ENCOUNTER — Ambulatory Visit (HOSPITAL_BASED_OUTPATIENT_CLINIC_OR_DEPARTMENT_OTHER): Payer: Medicare PPO | Admitting: Cardiology

## 2022-03-05 NOTE — Progress Notes (Incomplete)
Cardiology Office Note:    Date:  03/05/2022   ID:  [name], DOB 1965/02/23, MRN [id number]  PCP:  Lowry Ram, MD  Cardiologist:  Buford Dresser, MD PhD  Referring MD: Lowry Ram, MD   CC: follow up  History of Present Illness:    [name] is a 57 y.o. female with a hx of hypertension, sinus tachycardia, history of PFO (per PCP notes), remote PE. Initial consult was 12/07/2017.  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  -Long history of sinus tachycardia, asymptomatic  At her last appointment she was seen for pre-op clearance for a C3-4 anterior cervical fusion. She was feeling well until 08/2020. On 7/28 she received a spinal nerve block. The following morning she was unable to use her right index finger or thumb. She started Prednisone, which did not help. A few days later she suddenly lost the ability to speak for 40 minutes. She was admitted 08/15/2020 for a TIA.  She underwent left carotid artery endarterectomy 08/20/2020 performed by Dr. Stanford Breed.   Today,  She denies any palpitations, chest pain, shortness of breath, or peripheral edema. No lightheadedness, headaches, syncope, orthopnea, or PND.  (+)   Past Medical History:  Diagnosis Date   Arthritis    Bipolar 1 disorder ([place])    Carotid artery occlusion    Chronic kidney disease    donated left kidney   Heart murmur    Hypertension    Multiple sclerosis ([place])    Pneumonia 2010   had 2 episodes/ was sent home on O2   Prediabetes    Pulmonary embolism ([place]) 1994   Only 1 clot, not on lifelong anticoagulation   Stroke [facility])    TIA    Past Surgical History:  Procedure Laterality Date   ANTERIOR CERVICAL DECOMP/DISCECTOMY FUSION N/A 02/25/2021   Procedure: Anterior Cervical Discectomy Fusion - Cervical Three-Cervical Four;  Surgeon: Eustace Moore, MD;  Location: [place];  Service: Neurosurgery;  Laterality: N/A;  Anterior Cervical Discectomy Fusion -  Cervical Three-Cervical Four   APPENDECTOMY  2001   ENDARTERECTOMY Left 08/20/2020   Procedure: LEFT CAROTID ARTERY ENDARTERECTOMY;  Surgeon: Cherre Robins, MD;  Location: [place];  Service: Vascular;  Laterality: Left;   Endometriosis s/p left fallopian tube removal.  2001   [place]   Donated kidney to sister   [facility] ANGIOPLASTY Left 08/20/2020   Procedure: PATCH ANGIOPLASTY WITH 1x6 [facility];  Surgeon: Cherre Robins, MD;  Location: [facility] OR;  Service: Vascular;  Laterality: Left;   TONSILLECTOMY      Current Medications: Current Outpatient Medications on File Prior to Visit  Medication Sig   albuterol (VENTOLIN HFA) 108 (90 Base) MCG/ACT inhaler TAKE 2 PUFFS BY MOUTH EVERY 6 HOURS AS NEEDED FOR WHEEZE   aspirin EC 81 MG EC tablet Take 1 tablet (81 mg total) by mouth daily. Swallow whole.   atorvastatin (LIPITOR) 80 MG tablet Take 1 tablet (80 mg total) by mouth daily.   JARDIANCE 10 MG TABS tablet Take 10 mg by mouth daily.   lamoTRIgine (LAMICTAL) 200 MG tablet Take 1 tablet (200 mg total) by mouth at bedtime.   nicotine polacrilex (NICORETTE) 2 MG gum Take 1 each (2 mg total) by mouth as needed for smoking cessation.   Olmesartan-amLODIPine-HCTZ 20-5-12.5 MG TABS TAKE 1 TABLET BY MOUTH EVERY DAY   oxyCODONE (OXY IR/ROXICODONE) 5 MG immediate release tablet Take 1 tablet (5  mg total) by mouth every 4 (four) hours as needed for moderate pain ((score 4 to 6)).   Polyethyl Glycol-Propyl Glycol (SYSTANE OP) Place 1 drop into both eyes in the morning.   traZODone (DESYREL) 100 MG tablet TAKE 1 TABLET BY MOUTH EVERYDAY AT BEDTIME   No current facility-administered medications on file prior to visit.     Allergies:   Iohexol, Morphine and related, Hydrocodone, Diazepam, and Sulfa antibiotics   Social History   Tobacco Use   Smoking status: Every Day    Packs/day: 0.50    Years: 36.00    Total pack years: 18.00    Types: Cigarettes   Smokeless tobacco:  Never  Vaping Use   Vaping Use: Former  Substance Use Topics   Alcohol use: Yes    Comment: occassionally   Drug use: No    Family History: The patient's family history includes Breast cancer in her maternal aunt, maternal grandmother, and mother; CVA in her mother; Colon cancer in her paternal grandmother; Diabetes in her sister; Hodgkin's lymphoma in her sister; Kidney disease in her sister.  ROS:   Please see the history of present illness.    Additional pertinent ROS otherwise unremarkable.   EKGs/Labs/Other Studies Reviewed:    The following studies were reviewed today:  Bilateral Carotid Doppler  02/16/2022: Summary:  Right Carotid: Velocities in the right ICA are consistent with a 1-39%  stenosis.                Non-hemodynamically significant plaque <50% noted in the  CCA.   Left Carotid: Velocities in the left ICA are consistent with a 60-79%  stenosis.   Vertebrals: Bilateral vertebral arteries demonstrate antegrade flow.  Right              vertebral artery demonstrates high resistant flow.  Subclavians: Normal flow hemodynamics were seen in bilateral subclavian               arteries.   Bilateral Carotid Duplex 09/23/2020: Summary:  Right Carotid: Velocities in the right ICA are consistent with a 1-39%  stenosis.   Left Carotid: Velocities in the left ICA are consistent with a 1-39%  stenosis. Patent endarterectomy.   Vertebrals:  Bilateral vertebral arteries demonstrate antegrade flow.  Subclavians: Normal flow hemodynamics were seen in bilateral subclavian arteries.   Echo 08/16/2020:  1. Left ventricular ejection fraction, by estimation, is 55 to 60%. The  left ventricle has normal function. The left ventricle has no regional  wall motion abnormalities. There is mild left ventricular hypertrophy.  Left ventricular diastolic parameters  are indeterminate.   2. Right ventricular systolic function is normal. The right ventricular  size is normal.   3.  The mitral valve is normal in structure. No evidence of mitral valve  regurgitation.   4. The aortic valve is normal in structure. Aortic valve regurgitation is  not visualized.   5. The inferior vena cava is normal in size with greater than 50%  respiratory variability, suggesting right atrial pressure of 3 mmHg.   Echo 12/07/2016 Study Conclusions  - Left ventricle: The cavity size was normal. Wall thickness was   normal. The estimated ejection fraction was 60%. Wall motion was   normal; there were no regional wall motion abnormalities. Doppler   parameters are consistent with abnormal left ventricular   relaxation (grade 1 diastolic dysfunction). - Aortic valve: There was no stenosis. - Mitral valve: There was trivial regurgitation. - Right ventricle: The cavity size was  normal. Systolic function   was normal. - Pulmonary arteries: No complete TR doppler jet so unable to   estimate PA systolic pressure. - Inferior vena cava: The vessel was normal in size. The   respirophasic diameter changes were in the normal range (>= 50%),   consistent with normal central venous pressure.   Impressions:  - Normal LV size with EF 60%. Normal RV size and systolic function.   No significant valvular abnormalities.  EKG:  EKG is personally reviewed.   03/05/2022:  *** 01/20/2021: EKG was not ordered. 09/13/2019: sinus rhythm at 100 bpm  Recent Labs: 11/19/2021: ALT 14; BUN 21; Creatinine, Ser 1.44; Potassium 4.4; Sodium 144 12/17/2021: Hemoglobin 12.2; Platelets 279   Recent Lipid Panel    Component Value Date/Time   CHOL 149 08/21/2020 0520   CHOL 213 (H) 11/26/2019 1117   TRIG 106 08/21/2020 0520   HDL 58 08/21/2020 0520   HDL 63 11/26/2019 1117   CHOLHDL 2.6 08/21/2020 0520   VLDL 21 08/21/2020 0520   LDLCALC 70 08/21/2020 0520   LDLCALC 128 (H) 11/26/2019 1117    Physical Exam:    VS:  There were no vitals taken for this visit.    Wt Readings from Last 3 Encounters:  02/16/22  186 lb (84.4 kg)  11/27/21 185 lb 9.6 oz (84.2 kg)  11/19/21 183 lb (83 kg)    GEN: Well nourished, well developed in no acute distress HEENT: Normal, moist mucous membranes NECK: No JVD CARDIAC: regular rhythm, normal S1 and S2, no rubs or gallops. No murmur. VASCULAR: Radial and DP pulses 2+ bilaterally. No carotid bruits RESPIRATORY:  Clear to auscultation without rales, wheezing or rhonchi  ABDOMEN: Soft, non-tender, non-distended MUSCULOSKELETAL:  Ambulates independently SKIN: Warm and dry, no edema NEUROLOGIC:  Alert and oriented x 3. No focal neuro deficits noted. PSYCHIATRIC:  Normal affect   ASSESSMENT:    No diagnosis found.  PLAN:    Preoperative cardiovascular evaluation:  The patient is not currently having active cardiac symptoms, and they can achieve >4 METs of activity.  According to ACC/AHA Guidelines, no further testing is needed.  Proceed with surgery at acceptable risk.  Our service is available as needed in the peri-operative period.     Sinus tachycardia: has improved with increasing activity and weight loss  Hypertension: Has been well controlled. Slightly above goal of <130/80 today. -continue current meds.  -encouraged continued lifestyle changes, focused on activity and healthy eating  Tobacco abuse, counseled to quit  Primary prevention and heath counseling: -recommend heart healthy/Mediterranean diet, with whole grains, fruits, vegetable, fish, lean meats, nuts, and olive oil. Limit salt. -recommend moderate walking, 3-5 times/week for 30-50 minutes each session. Aim for at least 150 minutes.week. Goal should be pace of 3 miles/hours, or walking 1.5 miles in 30 minutes -recommend avoidance of tobacco products. Avoid excess alcohol. -ASCVD risk score: The ASCVD Risk score (Arnett DK, et al., 2019) failed to calculate for the following reasons:   The patient has a prior MI or stroke diagnosis   Plan for follow up: ***1 year or sooner as  needed  Buford Dresser, MD, PhD [place]  [facility]   Medication Adjustments/Labs and Tests Ordered: Current medicines are reviewed at length with the patient today.  Concerns regarding medicines are outlined above.   No orders of the defined types were placed in this encounter.  No orders of the defined types were placed in this encounter.  There are no Patient Instructions on  file for this visit.   I,Mathew Stumpf,acting as a Education administrator for [employer], MD.,have documented all relevant documentation on the behalf of Buford Dresser, MD,as directed by  Buford Dresser, MD while in the presence of Buford Dresser, MD.  I, Madelin Rear, have reviewed all documentation for this visit. The documentation on 03/05/22 for the exam, diagnosis, procedures, and orders are all accurate and complete.   Signed, Buford Dresser, MD PhD 03/05/2022     [place]

## 2022-03-15 ENCOUNTER — Telehealth (HOSPITAL_BASED_OUTPATIENT_CLINIC_OR_DEPARTMENT_OTHER): Payer: Self-pay | Admitting: Family

## 2022-03-15 ENCOUNTER — Ambulatory Visit (HOSPITAL_BASED_OUTPATIENT_CLINIC_OR_DEPARTMENT_OTHER): Payer: Medicare PPO | Admitting: Cardiology

## 2022-03-15 NOTE — Telephone Encounter (Signed)
Spoke with patient regarding  her 03/18/22 1:55 pm appointment with Laurann Montana, NP---rescheduled to 04/01/22 at 2:45 pm----patient voiced her understanding---provider not in the office

## 2022-03-15 NOTE — Progress Notes (Deleted)
[facility] [place] PROGRESS NOTE  ASSESSMENT / PLAN: [name] is a 57 y.o. female status post left carotid endarterectomy 08/21/20 for symptomatic left carotid artery stenosis.  Patient has done extremely well postoperatively.  Counseled her to continue aspirin, statin therapies indefinitely.  Her immediate postoperative carotid duplex showed no evidence of stenosis.  Duplex today shows stenosis in the 60 to 79% range.  Thankfully she is asymptomatic.  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  Will rescan her in 1 to 2 months.  SUBJECTIVE: Patient returns to clinic for postop evaluation.  As usual, she has maintained a excellent sense of humor.  We reviewed her test results, and the abnormal reaccumulation of stenosis seen today on duplex.  She has been struggling with a new diagnosis of multiple sclerosis.  OBJECTIVE: There were no vitals taken for this visit.  No acute distress Left neck well-healed Regular rate and rhythm Unlabored breathing     Latest Ref Rng & Units 12/17/2021   10:00 AM 11/19/2021   10:18 AM 02/23/2021    3:32 PM  CBC  WBC 3.4 - 10.8 x10E3/uL 8.9  8.8  10.3   Hemoglobin 11.1 - 15.9 g/dL 12.2  13.2  13.4   Hematocrit 34.0 - 46.6 % 37.3  40.5  42.3   Platelets 150 - 450 x10E3/uL 279   289         Latest Ref Rng & Units 11/19/2021   10:18 AM 09/24/2021    2:48 PM 03/11/2021    3:21 PM  CMP  Glucose 70 - 99 mg/dL 118   89   BUN 6 - 24 mg/dL 21   12   Creatinine 0.57 - 1.00 mg/dL 1.44  1.46  1.22   Sodium 134 - 144 mmol/L 144   144   Potassium 3.5 - 5.2 mmol/L 4.4   4.7   Chloride 96 - 106 mmol/L 103   106   CO2 20 - 29 mmol/L 24   26   Calcium 8.7 - 10.2 mg/dL 10.0   9.2   Total Protein 6.0 - 8.5 g/dL 7.8     Total Bilirubin 0.0 - 1.2 mg/dL 0.3     Alkaline Phos 44 - 121 IU/L 105     AST 0 - 40 IU/L 18     ALT 0 - 32 IU/L 14      Duplex personally reviewed 60 to 79% left carotid artery stenosis on duplex today Minimal right  carotid artery stenosis  [name]. Stanford Breed, MD [facility] Phone Number: [phone] 03/15/2022 9:07 PM

## 2022-03-16 ENCOUNTER — Ambulatory Visit: Payer: Medicare PPO | Admitting: Vascular Surgery

## 2022-03-16 ENCOUNTER — Ambulatory Visit (HOSPITAL_COMMUNITY): Payer: Medicare PPO | Attending: Vascular Surgery

## 2022-03-18 ENCOUNTER — Ambulatory Visit (HOSPITAL_BASED_OUTPATIENT_CLINIC_OR_DEPARTMENT_OTHER): Payer: Medicare PPO | Admitting: Family

## 2022-03-25 ENCOUNTER — Telehealth: Payer: Self-pay | Admitting: Neurology

## 2022-03-25 ENCOUNTER — Ambulatory Visit: Payer: Medicare PPO | Admitting: Neurology

## 2022-03-25 ENCOUNTER — Encounter: Payer: Self-pay | Admitting: Neurology

## 2022-03-25 VITALS — BP 139/83 | HR 111 | Ht 69.0 in | Wt 187.0 lb

## 2022-03-25 DIAGNOSIS — R0683 Snoring: Secondary | ICD-10-CM

## 2022-03-25 DIAGNOSIS — R930 Abnormal findings on diagnostic imaging of skull and head, not elsewhere classified: Secondary | ICD-10-CM

## 2022-03-25 DIAGNOSIS — I639 Cerebral infarction, unspecified: Secondary | ICD-10-CM | POA: Diagnosis not present

## 2022-03-25 DIAGNOSIS — N882 Stricture and stenosis of cervix uteri: Secondary | ICD-10-CM | POA: Diagnosis not present

## 2022-03-25 DIAGNOSIS — G35 Multiple sclerosis: Secondary | ICD-10-CM | POA: Diagnosis not present

## 2022-03-25 DIAGNOSIS — R799 Abnormal finding of blood chemistry, unspecified: Secondary | ICD-10-CM | POA: Diagnosis not present

## 2022-03-25 DIAGNOSIS — E663 Overweight: Secondary | ICD-10-CM

## 2022-03-25 DIAGNOSIS — I63239 Cerebral infarction due to unspecified occlusion or stenosis of unspecified carotid arteries: Secondary | ICD-10-CM

## 2022-03-25 DIAGNOSIS — Z820 Family history of epilepsy and other diseases of the nervous system: Secondary | ICD-10-CM

## 2022-03-25 NOTE — Telephone Encounter (Signed)
She was seen by Dr. Rexene Alberts in the past for possible obstructive sleep apnea, deferred sleep study at that time, wants to proceed now, please help
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
## 2022-03-26 LAB — CBC WITH DIFFERENTIAL/PLATELET
Basophils Absolute: 0.1 10*3/uL (ref 0.0–0.2)
Basos: 1 %
EOS (ABSOLUTE): 0.2 10*3/uL (ref 0.0–0.4)
Eos: 2 %
Hematocrit: 39.1 % (ref 34.0–46.6)
Hemoglobin: 12.4 g/dL (ref 11.1–15.9)
Immature Grans (Abs): 0 10*3/uL (ref 0.0–0.1)
Immature Granulocytes: 0 %
Lymphocytes Absolute: 3.5 10*3/uL — ABNORMAL HIGH (ref 0.7–3.1)
Lymphs: 39 %
MCH: 26.8 pg (ref 26.6–33.0)
MCHC: 31.7 g/dL (ref 31.5–35.7)
MCV: 84 fL (ref 79–97)
Monocytes Absolute: 0.7 10*3/uL (ref 0.1–0.9)
Monocytes: 7 %
Neutrophils Absolute: 4.5 10*3/uL (ref 1.4–7.0)
Neutrophils: 51 %
Platelets: 303 10*3/uL (ref 150–450)
RBC: 4.63 x10E6/uL (ref 3.77–5.28)
RDW: 14.5 % (ref 11.7–15.4)
WBC: 8.9 10*3/uL (ref 3.4–10.8)

## 2022-03-26 LAB — CD20 B CELLS
% CD19-B Cells: 0 % — ABNORMAL LOW (ref 4.6–22.1)
% CD20-B Cells: 0 % — ABNORMAL LOW (ref 5.0–22.3)

## 2022-03-26 LAB — IGG, IGA, IGM
IgA/Immunoglobulin A, Serum: 386 mg/dL — ABNORMAL HIGH (ref 87–352)
IgG (Immunoglobin G), Serum: 1031 mg/dL (ref 586–1602)
IgM (Immunoglobulin M), Srm: 198 mg/dL (ref 26–217)

## 2022-03-29 NOTE — Addendum Note (Signed)
Addended by: Star Age on: 03/29/2022 09:55 AM   Modules accepted: Orders

## 2022-03-29 NOTE — Telephone Encounter (Signed)
Please proceed with authorization for sleep testing.  Patient was seen in June 2023 in sleep clinic.

## 2022-03-29 NOTE — Telephone Encounter (Signed)
New PSG order placed.

## 2022-03-29 NOTE — Telephone Encounter (Signed)
Noted, I will start working on it. Can you put a new order in for the NPSG/HST because I see it has been ordered back in June but I don't see it where I can link it to the appointment once it has been scheduled. Thank you.

## 2022-03-30 ENCOUNTER — Telehealth: Payer: Self-pay | Admitting: Neurology

## 2022-03-30 IMAGING — MG MM DIGITAL SCREENING BILAT W/ TOMO AND CAD
5 series · 6 of 17 positions shown · non-contrast
Comparison: Previous exam(s).

CLINICAL DATA: Screening.

EXAM:
DIGITAL SCREENING BILATERAL MAMMOGRAM WITH TOMOSYNTHESIS AND CAD
TECHNIQUE: Bilateral screening digital craniocaudal and mediolateral oblique
mammograms were obtained. Bilateral screening digital breast
tomosynthesis was performed. The images were evaluated with
computer-aided detection.

[L CC synth-2D]
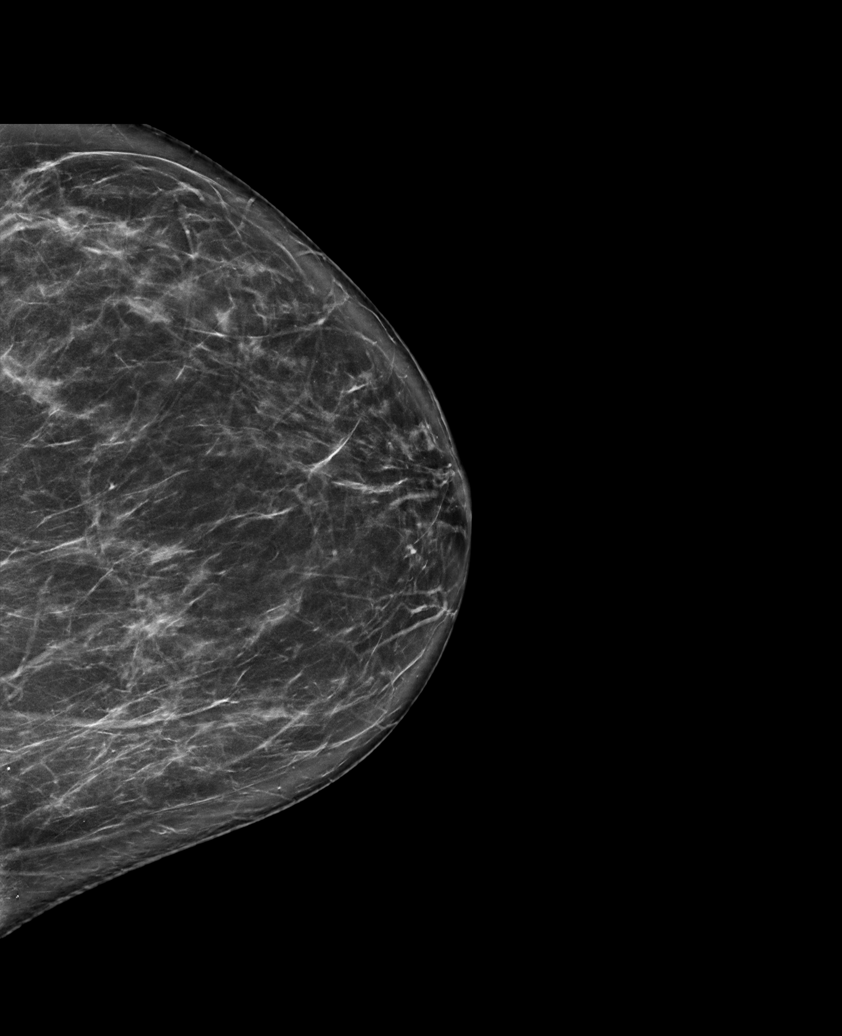

[L MLO synth-2D]
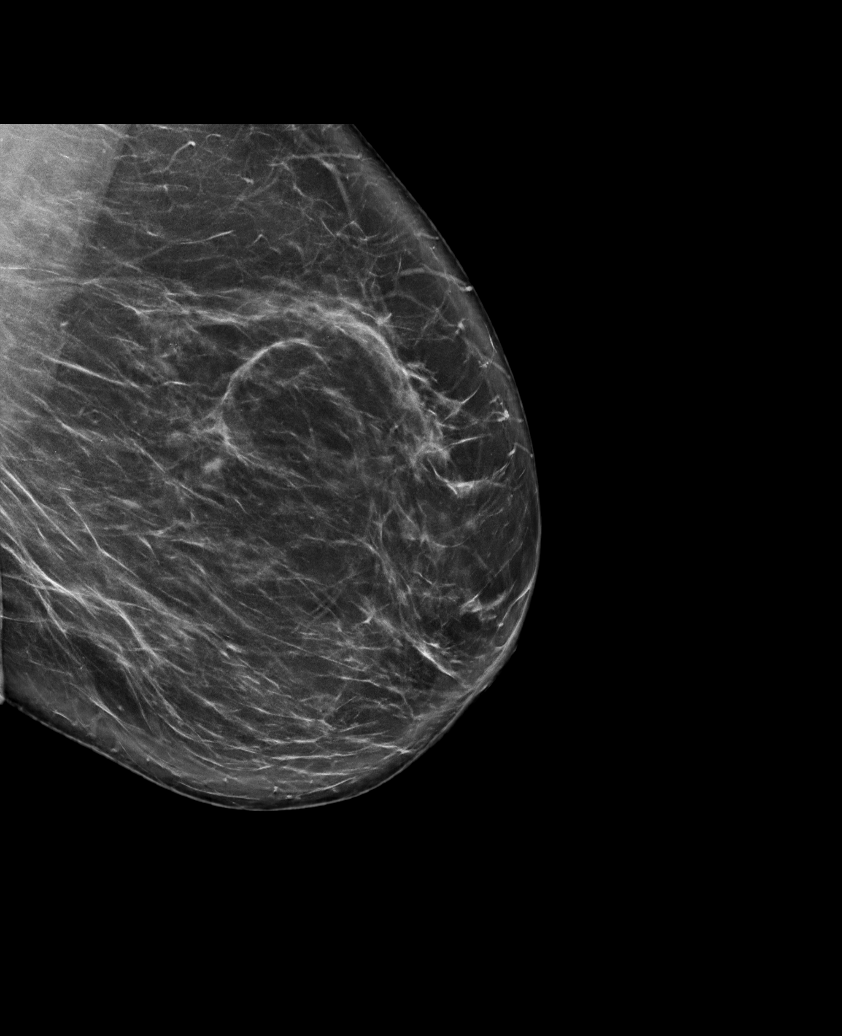

[R CC tomo · 2 of 78 frames shown]
[frame 26/78]
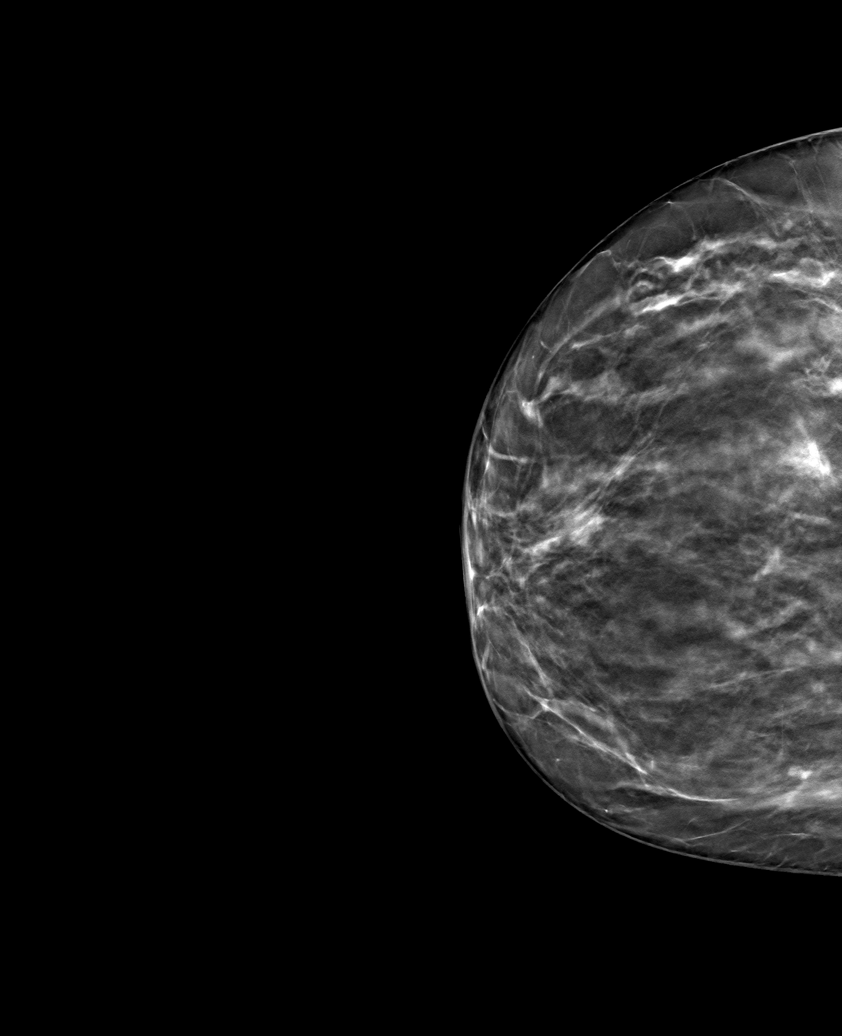
[frame 39/78]
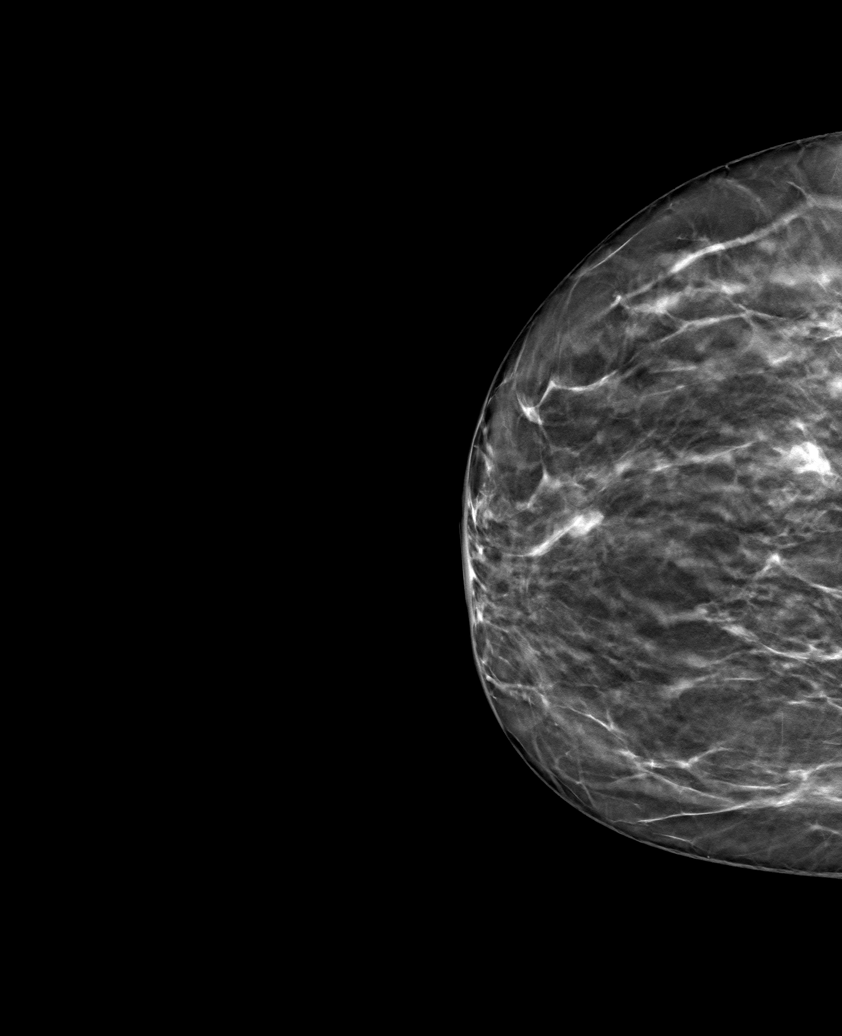

[L MLO tomo · tomo slice 47/93.0]
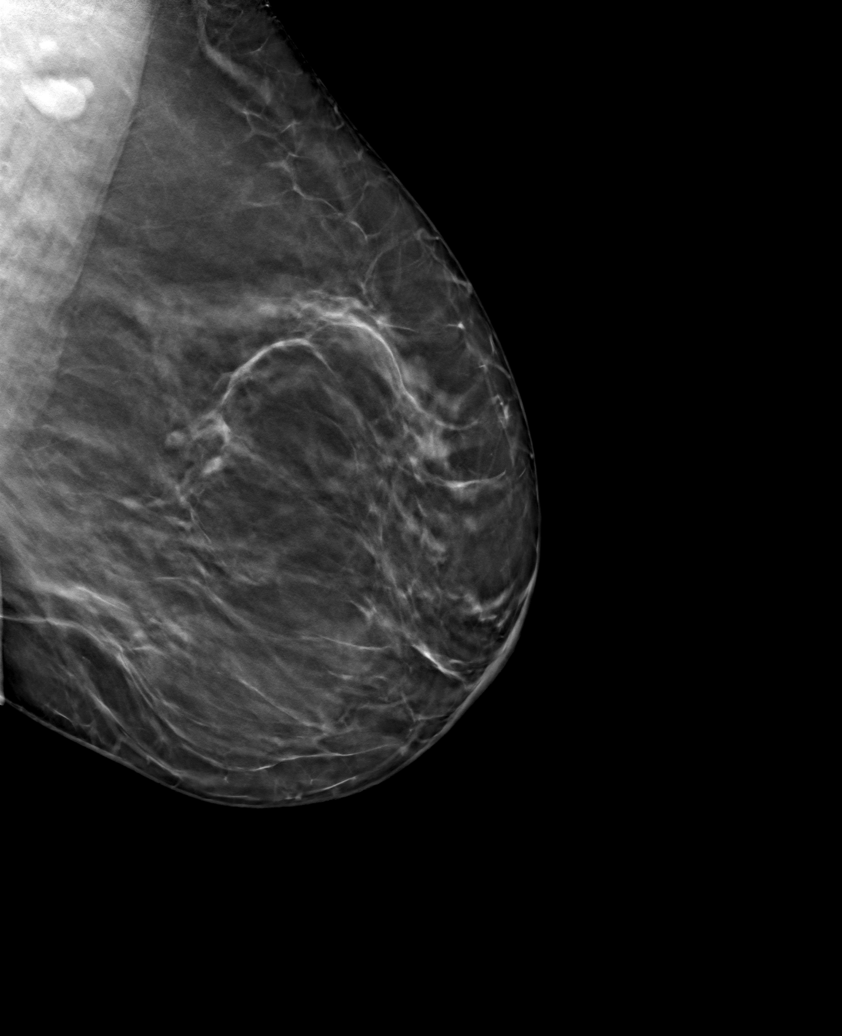

[R MLO tomo · tomo slice 48/95.0]
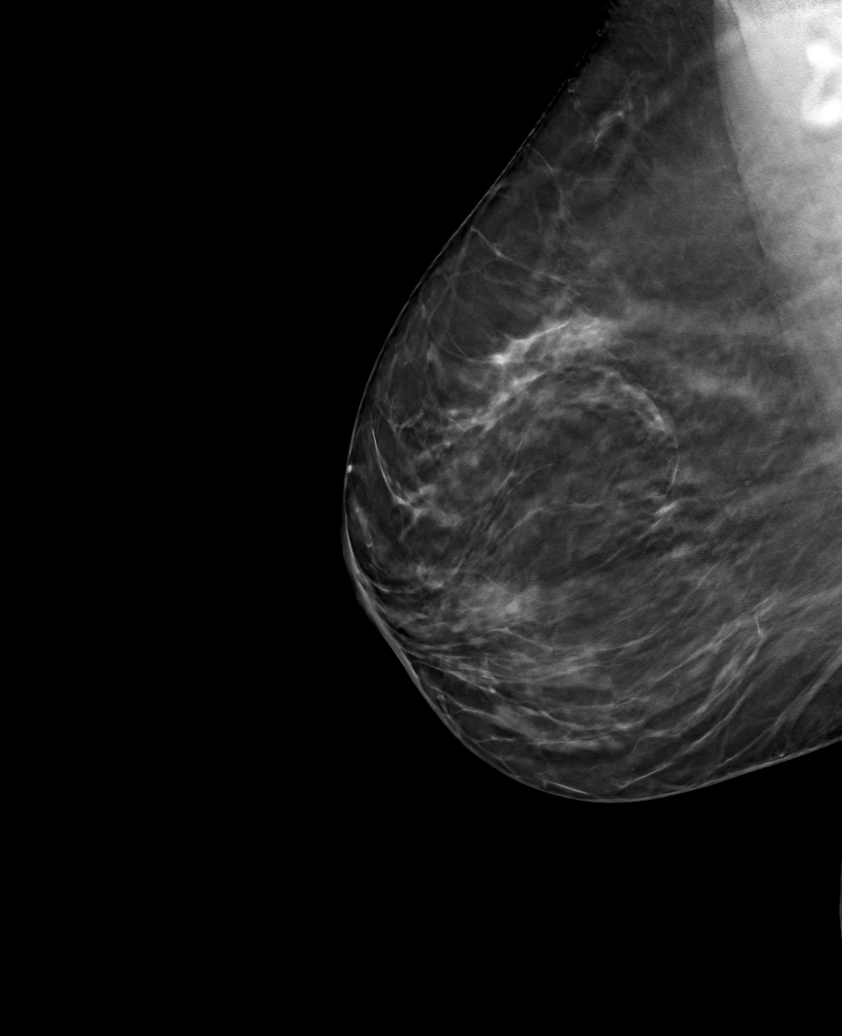

[6 of 17 positions shown; findings below may reference images not displayed]

ACR Breast Density Category b: There are scattered areas of
fibroglandular density.
FINDINGS: There are no findings suspicious for malignancy.
IMPRESSION: No mammographic evidence of malignancy. A result letter of this
screening mammogram will be mailed directly to the patient.

RECOMMENDATION:
Screening mammogram in one year. (Code:51-O-LD2)

BI-RADS CATEGORY  1: Negative.

## 2022-03-30 NOTE — Telephone Encounter (Signed)
Pt called stated she is having problems with her left big toes. Stated it's hurting really badly. Pt said she don't know if her MS is acting up but she is in pain. Pt is requesting a call back from nurse.

## 2022-03-30 NOTE — Telephone Encounter (Signed)
..   Pt understands that although there may be some limitations with this type of visit, we will take all precautions to reduce any security or privacy concerns.  Pt understands that this will be treated like an in office visit and we will file with pt's insurance, and there may be a patient responsible charge related to this service. ? ?

## 2022-03-30 NOTE — Telephone Encounter (Signed)
I called patient to discuss. No answer, left a message asking her to call us back. If patient calls back please route to POD 2.

## 2022-03-30 NOTE — Telephone Encounter (Signed)
I called patient. She reports that since yesterday her L big toe feels as if it has been smashed with a hammer. She tried arthritis gel and soaking her foot but neither of these helped. She tried tylenol but that didn't help. Denies any s/s of an infection. I advised her that I wasn't sure if this was MS related but patient says that "everything is related to [place]" and therefore has not asked her PCP about her L big toe pain. She is wondering if Dr. Krista Blue has any recommendations for pain in her L big toe.

## 2022-03-30 NOTE — Addendum Note (Signed)
Addended by: Lester Crowder A on: 03/30/2022 01:05 PM   Modules accepted: Orders

## 2022-03-30 NOTE — Telephone Encounter (Signed)
Less likely due to MS, she should contact her pcp first to rule out local issues or even gout,

## 2022-03-31 DIAGNOSIS — M79675 Pain in left toe(s): Secondary | ICD-10-CM | POA: Diagnosis not present

## 2022-04-01 ENCOUNTER — Ambulatory Visit (HOSPITAL_BASED_OUTPATIENT_CLINIC_OR_DEPARTMENT_OTHER): Payer: Medicare PPO | Admitting: Family

## 2022-04-01 ENCOUNTER — Encounter (HOSPITAL_BASED_OUTPATIENT_CLINIC_OR_DEPARTMENT_OTHER): Payer: Self-pay | Admitting: Family

## 2022-04-01 VITALS — BP 110/62 | HR 106 | Ht 69.0 in | Wt 190.5 lb

## 2022-04-01 DIAGNOSIS — I6523 Occlusion and stenosis of bilateral carotid arteries: Secondary | ICD-10-CM | POA: Diagnosis not present

## 2022-04-01 DIAGNOSIS — E785 Hyperlipidemia, unspecified: Secondary | ICD-10-CM | POA: Diagnosis not present

## 2022-04-01 DIAGNOSIS — Z8673 Personal history of transient ischemic attack (TIA), and cerebral infarction without residual deficits: Secondary | ICD-10-CM

## 2022-04-01 DIAGNOSIS — I10 Essential (primary) hypertension: Secondary | ICD-10-CM | POA: Diagnosis not present

## 2022-04-01 DIAGNOSIS — R000 Tachycardia, unspecified: Secondary | ICD-10-CM | POA: Diagnosis not present

## 2022-04-01 NOTE — Telephone Encounter (Signed)
Pt was called, message from RN was relayed that she needs to contact her PCP

## 2022-04-01 NOTE — Progress Notes (Signed)
Office Visit    Patient Name: [name] Date of Encounter: 04/01/2022  PCP:  Lowry Ram, MD   [place]  Cardiologist:  Buford Dresser, MD  Advanced Practice Provider:  No care team member to display Electrophysiologist:  None   Chief Complaint    [name] is a 57 y.o. female presents today for hypertension follow up   Past Medical History    Past Medical History:  Diagnosis Date   Arthritis    Bipolar 1 disorder ([place])    Carotid artery occlusion    Chronic kidney disease    donated left kidney   Heart murmur    Hypertension    Multiple sclerosis ([place])    Pneumonia 2010   had 2 episodes/ was sent home on O2   Prediabetes    Pulmonary embolism ([place]) 1994   Only 1 clot, not on lifelong anticoagulation   Stroke [facility])    TIA   Past Surgical History:  Procedure Laterality Date   ANTERIOR CERVICAL DECOMP/DISCECTOMY FUSION N/A 02/25/2021   Procedure: Anterior Cervical Discectomy Fusion - Cervical Three-Cervical Four;  Surgeon: Eustace Moore, MD;  Location: [place];  Service: Neurosurgery;  Laterality: N/A;  Anterior Cervical Discectomy Fusion - Cervical Three-Cervical Four   APPENDECTOMY  2001   ENDARTERECTOMY Left 08/20/2020   Procedure: LEFT CAROTID ARTERY ENDARTERECTOMY;  Surgeon: Cherre Robins, MD;  Location: [place];  Service: Vascular;  Laterality: Left;   Endometriosis s/p left fallopian tube removal.  2001   [place]   Donated kidney to sister   [facility] ANGIOPLASTY Left 08/20/2020   Procedure: PATCH ANGIOPLASTY WITH 1x6 Weyman Pedro;  Surgeon: Cherre Robins, MD;  Location: [place];  Service: Vascular;  Laterality: Left;   TONSILLECTOMY      Allergies  Allergies  Allergen Reactions   Iohexol Hives    Hives 06/06/04, needs pre meds    Morphine And Related Hives and Itching    Denies Airway involvement    pt-says she's not allergic to this medication   Diazepam Rash   Sulfa  Antibiotics Itching and Rash    Reported with TMP/ SMX  Denies Airway involvement    History of Present Illness    [name] is a 57 y.o. female with a hx of HTN, ST, history of PFO (per PCP notes), remote PE last seen 01/20/21 by Dr. Harrell Gave. Did previously donate a kidney to her sister and has one remaining kidney.   Last saw Dr. Harrell Gave 01/20/21 for preop clearance for cervical fusion. She was doing well from cardiac perspective and clearance granted.   Presents today for follow up. Since last seen she had cervical neck surgery 02/2021. Has been diagnosed with MS confirmed by spinal tap in 12/2021. Overall has been asymptomatic and recently started Okrevus. Monday woke up with her big toe hurting and has been wearing boot. She did see orthopedics who did Xray showing arthritis in her big toe for which she is taking PRN Naproxen.   Reports no shortness of breath nor dyspnea on exertion. Reports no chest pain, pressure, or tightness. No edema, orthopnea, PND. Reports no palpitations.    EKGs/Labs/Other Studies Reviewed:   The following studies were reviewed today: Cardiac Studies & Procedures       ECHOCARDIOGRAM  ECHOCARDIOGRAM LIMITED BUBBLE STUDY 08/17/2020  Narrative ECHOCARDIOGRAM LIMITED REPORT    Patient Name:   [name] Date of Exam: 08/17/2020 Medical Rec #:  [id number]        Height:       69.0 in Accession #:    [id number]       Weight:       154.3 lb Date of Birth:  [birth year]        BSA:          1.850 m Patient Age:    33 years         BP:           145/75 mmHg Patient Gender: F                HR:           79 bpm. Exam Location:  Inpatient  Procedure: Limited Echo and Saline Contrast Bubble Study  Indications:    Bubble study only  History:        Patient has prior history of Echocardiogram examinations, most recent 08/16/2020. Risk Factors:Hypertension.  Sonographer:    [place] Referring Phys: [id number] [place]   1. Limited exam for Bubble Study Negative for intracardiac shunt. 2. Agitated saline contrast bubble study was negative, with no evidence of any interatrial shunt.  FINDINGS IAS/Shunts: Agitated saline contrast was given intravenously to evaluate for intracardiac shunting. Agitated saline contrast bubble study was negative, with no evidence of any interatrial shunt.  Dorris Carnes MD Electronically signed by Dorris Carnes MD Signature Date/Time: 08/17/2020/9:54:04 AM    Final              EKG:  EKG is  ordered today.  The ekg ordered today demonstrates ST 106 bpm with no acute ST/T wave changes.   Recent Labs: 11/19/2021: ALT 14; BUN 21; Creatinine, Ser 1.44; Potassium 4.4; Sodium 144 03/25/2022: Hemoglobin 12.4; Platelets 303  Recent Lipid Panel    Component Value Date/Time   CHOL 149 08/21/2020 0520   CHOL 213 (H) 11/26/2019 1117   TRIG 106 08/21/2020 0520   HDL 58 08/21/2020 0520   HDL 63 11/26/2019 1117   CHOLHDL 2.6 08/21/2020 0520   VLDL 21 08/21/2020 0520   LDLCALC 70 08/21/2020 0520   LDLCALC 128 (H) 11/26/2019 1117     Home Medications   Current Meds  Medication Sig   albuterol (VENTOLIN HFA) 108 (90 Base) MCG/ACT inhaler TAKE 2 PUFFS BY MOUTH EVERY 6 HOURS AS NEEDED FOR WHEEZE   aspirin EC 81 MG EC tablet Take 1 tablet (81 mg total) by mouth daily. Swallow whole.   atorvastatin (LIPITOR) 80 MG tablet Take 1 tablet (80 mg total) by mouth daily.   JARDIANCE 10 MG TABS tablet Take 10 mg by mouth daily.   lamoTRIgine (LAMICTAL) 200 MG tablet Take 1 tablet (200 mg total) by mouth at bedtime.   nicotine polacrilex (NICORETTE) 2 MG gum Take 1 each (2 mg total) by mouth as needed for smoking cessation.   Olmesartan-amLODIPine-HCTZ 20-5-12.5 MG TABS TAKE 1 TABLET BY MOUTH EVERY DAY   Polyethyl Glycol-Propyl Glycol (SYSTANE OP) Place 1 drop into both eyes in the morning.   traZODone (DESYREL) 100 MG tablet TAKE 1 TABLET BY MOUTH EVERYDAY AT BEDTIME     Review of Systems      All other systems reviewed and are otherwise negative except as noted above.  Physical Exam    VS:  BP 110/62 (BP Location: Left Arm, Patient Position: Sitting, Cuff Size: Large)   Pulse (!) 106   Ht 5\' 9"  (1.753 m)   Wt 190 lb  8 oz (86.4 kg)   BMI 28.13 kg/m  , BMI Body mass index is 28.13 kg/m.  Wt Readings from Last 3 Encounters:  04/01/22 190 lb 8 oz (86.4 kg)  03/25/22 187 lb (84.8 kg)  02/16/22 186 lb (84.4 kg)     GEN: Well nourished, well developed, in no acute distress. HEENT: normal. Neck: Supple, no JVD, carotid bruits, or masses. Cardiac: RRR, no murmurs, rubs, or gallops. No clubbing, cyanosis, edema.  Radials/PT 2+ and equal bilaterally.  Respiratory:  Respirations regular and unlabored, clear to auscultation bilaterally. GI: Soft, nontender, nondistended. MS: No deformity or atrophy. Skin: Warm and dry, no rash. Neuro:  Strength and sensation are intact. Psych: Normal affect.  Assessment & Plan    Sinus tachycardia - EKG today ST 106 bpm which is asymptomatic. No evidence of malignant arrhythmia. No indication for further intervention at this time. Recommend staying hydrated, avoiding caffeine.   HTN - BP well controlled. Continue current antihypertensive regimen.    Aortic atherosclerosis / Hx of CVA / HLD - Continue Atorvastatin 10mg  QD. Due for lipid panel, ordered today. LDL goal <70. Appreciate inclusion of Jardiance for cardioprotective benefit.  Bilateral carotid stenosis - [id number] duplex LICA [id number] stenosis and RICA 1-39% stenosis. Follows with [facility]. Continue Atorvastatin.          Disposition: Follow up in 1 year(s) with Buford Dresser, MD or APP.  Signed, Loel Dubonnet, NP 04/01/2022, 2:56 PM [facility]

## 2022-04-01 NOTE — Patient Instructions (Addendum)
Medication Instructions:  Continue your current medications.   *If you need a refill on your cardiac medications before your next appointment, please call your pharmacy*   Lab Work: Your physician recommends that you return for lab work within the next month for fasting lipid panel.   Please return for Lab work. You may come to the...   [facility] (3rd floor) [address] [place], [place] [address]  Open: 8am-Noon and 1pm-4:30pm  Please ring the doorbell on the small table when you exit the elevator and the Lab Tech will come get you  [place] [facility] [address] [place], [place],  [address] Open: 8am-1pm, then 2pm-4:30pm   [place]- Please see attached locations sheet stapled to your lab work with address and hours.    If you have labs (blood work) drawn today and your tests are completely normal, you will receive your results only by: [place] (if you have MyChart) OR A paper copy in the mail If you have any lab test that is abnormal or we need to change your treatment, we will call you to review the results.   Testing/Procedures: Your EKG showed sinus tachycardia which is a normal heart rhythm that is a bit fast. This is a stable finding.   Follow-Up: At [facility], you and your health needs are our priority.  As part of our continuing mission to provide you with exceptional heart care, we have created designated Provider Care Teams.  These Care Teams include your primary Cardiologist (physician) and Advanced Practice Providers (APPs -  Physician Assistants and Nurse Practitioners) who all work together to provide you with the care you need, when you need it.  We recommend signing up for the patient portal called "MyChart".  Sign up information is provided on this After Visit Summary.  MyChart is used to connect with patients for Virtual Visits (Telemedicine).  Patients are able to view lab/test  results, encounter notes, upcoming appointments, etc.  Non-urgent messages can be sent to your provider as well.   To learn more about what you can do with MyChart, go to [email or web address].    Your next appointment:   1 year(s)  Provider:   Buford Dresser, MD   Other Instructions  Heart Healthy Diet Recommendations: A low-salt diet is recommended. Meats should be grilled, baked, or boiled. Avoid fried foods. Focus on lean protein sources like fish or chicken with vegetables and fruits. The American Heart Association is a Microbiologist!  American Heart Association Diet and Lifeystyle Recommendations   Exercise recommendations: The American Heart Association recommends 150 minutes of moderate intensity exercise weekly. Try 30 minutes of moderate intensity exercise 4-5 times per week. This could include walking, jogging, or swimming.

## 2022-04-02 DIAGNOSIS — I129 Hypertensive chronic kidney disease with stage 1 through stage 4 chronic kidney disease, or unspecified chronic kidney disease: Secondary | ICD-10-CM | POA: Diagnosis not present

## 2022-04-02 DIAGNOSIS — N1831 Chronic kidney disease, stage 3a: Secondary | ICD-10-CM | POA: Diagnosis not present

## 2022-04-02 DIAGNOSIS — D631 Anemia in chronic kidney disease: Secondary | ICD-10-CM | POA: Diagnosis not present

## 2022-04-02 DIAGNOSIS — N390 Urinary tract infection, site not specified: Secondary | ICD-10-CM | POA: Diagnosis not present

## 2022-04-02 DIAGNOSIS — N2581 Secondary hyperparathyroidism of renal origin: Secondary | ICD-10-CM | POA: Diagnosis not present

## 2022-04-03 ENCOUNTER — Encounter (HOSPITAL_BASED_OUTPATIENT_CLINIC_OR_DEPARTMENT_OTHER): Payer: Self-pay | Admitting: Family

## 2022-04-12 ENCOUNTER — Telehealth (HOSPITAL_BASED_OUTPATIENT_CLINIC_OR_DEPARTMENT_OTHER): Payer: Self-pay | Admitting: Cardiology

## 2022-04-12 NOTE — Telephone Encounter (Signed)
Patient stated she is going to her kidney doctor, Dr. Posey Pronto this week and will be having blood drawn.  Patient stated she wants orders for her cardiology lab tests sent to Dr. Serita Grit office so she can have all blood work done at that time.  Patient provided Dr. Serita Grit phone [phone] and requested a call back to confirm order will be sent.

## 2022-04-12 NOTE — Telephone Encounter (Signed)
Patient needing lipid panel, printed and faxed at patient request.

## 2022-04-14 DIAGNOSIS — E785 Hyperlipidemia, unspecified: Secondary | ICD-10-CM | POA: Diagnosis not present

## 2022-04-14 DIAGNOSIS — N1831 Chronic kidney disease, stage 3a: Secondary | ICD-10-CM | POA: Diagnosis not present

## 2022-04-15 LAB — LIPID PANEL
Chol/HDL Ratio: 3.1 ratio (ref 0.0–4.4)
Cholesterol, Total: 142 mg/dL (ref 100–199)
HDL: 46 mg/dL (ref >39)
LDL Chol Calc (NIH): 70 mg/dL (ref 0–99)
Triglycerides: 150 mg/dL — ABNORMAL HIGH (ref 0–149)
VLDL Cholesterol Cal: 26 mg/dL (ref 5–40)

## 2022-04-30 ENCOUNTER — Other Ambulatory Visit: Payer: Self-pay

## 2022-04-30 ENCOUNTER — Other Ambulatory Visit (HOSPITAL_COMMUNITY)
Admission: RE | Admit: 2022-04-30 | Discharge: 2022-04-30 | Disposition: A | Discharge status: Home / Self Care | Payer: Medicare PPO | Source: Ambulatory Visit | Attending: Family Medicine | Admitting: Family Medicine

## 2022-04-30 ENCOUNTER — Ambulatory Visit (INDEPENDENT_AMBULATORY_CARE_PROVIDER_SITE_OTHER): Payer: Medicare PPO | Admitting: Family Medicine

## 2022-04-30 VITALS — BP 133/70 | HR 110 | Ht 69.0 in | Wt 185.2 lb

## 2022-04-30 DIAGNOSIS — R7303 Prediabetes: Secondary | ICD-10-CM

## 2022-04-30 DIAGNOSIS — Z01419 Encounter for gynecological examination (general) (routine) without abnormal findings: Secondary | ICD-10-CM | POA: Insufficient documentation

## 2022-04-30 DIAGNOSIS — Z720 Tobacco use: Secondary | ICD-10-CM

## 2022-04-30 DIAGNOSIS — N183 Chronic kidney disease, stage 3 unspecified: Secondary | ICD-10-CM | POA: Diagnosis not present

## 2022-04-30 DIAGNOSIS — Z713 Dietary counseling and surveillance: Secondary | ICD-10-CM

## 2022-04-30 DIAGNOSIS — Z124 Encounter for screening for malignant neoplasm of cervix: Secondary | ICD-10-CM

## 2022-04-30 DIAGNOSIS — E119 Type 2 diabetes mellitus without complications: Secondary | ICD-10-CM

## 2022-04-30 DIAGNOSIS — Z1151 Encounter for screening for human papillomavirus (HPV): Secondary | ICD-10-CM | POA: Insufficient documentation

## 2022-04-30 DIAGNOSIS — M158 Other polyosteoarthritis: Secondary | ICD-10-CM | POA: Diagnosis not present

## 2022-04-30 MED ORDER — DICLOFENAC SODIUM 1 % EX GEL: 2.0000 g | Freq: Four times a day (QID) | CUTANEOUS | 1 refills | Status: AC

## 2022-04-30 MED ORDER — NICOTINE POLACRILEX 2 MG MT GUM: 2.0000 mg | CHEWING_GUM | OROMUCOSAL | 0 refills | Status: AC | PRN

## 2022-04-30 MED ORDER — SEMAGLUTIDE(0.25 OR 0.5MG/DOS) 2 MG/1.5ML ~~LOC~~ SOPN: 0.2500 mg | PEN_INJECTOR | SUBCUTANEOUS | 3 refills | Status: DC

## 2022-04-30 MED ORDER — NICOTINE 14 MG/24HR TD PT24: 14.0000 mg | MEDICATED_PATCH | Freq: Every day | TRANSDERMAL | 0 refills | Status: AC

## 2022-04-30 NOTE — Patient Instructions (Signed)
It was wonderful to see you today.  Please bring ALL of your medications with you to every visit.   Today we talked about:  Weight - I prescribed ozempic. Please do this as a weekly injection.   I also prescribed patches and gum to help with smoking.   Please follow up in 1 month  Thank you for choosing [facility].   Please call [phone] with any questions about today's appointment.  Please be sure to schedule follow up at the front desk before you leave today.   Lockie Mola, MD  Family Medicine

## 2022-04-30 NOTE — Progress Notes (Unsigned)
    SUBJECTIVE:   CHIEF COMPLAINT / HPI:   Desire for Weight loss  Patient says that she would like to lose weight. Says she feels like things have gotten too far. Patient states that it is difficult for her to exercise given her scoliosis surgery as well as her MS. She gets tired easily and cannot lift weights. States that otherwise she has not tried anything in particular.   Smoking Cessation  Patient states that she is down to 10 cigarettes a week. She would like to quit and has had luck with nicotine patches in the past. However, she says they were expensive before. She is apprehensive to try medication because she is worried it would affect her bipolar disorder.   PERTINENT  PMH / PSH: Bipolar 1 disorder, MS, CKD III, Prediabetes, S/p cervical spinal fusion   OBJECTIVE:   BP 133/70   Pulse (!) 110   Ht  (1.753 m)   Wt 185 lb 3.2 oz (84 kg)   SpO2 100%   BMI 27.35 kg/m   General: well appearing, in no acute distress CV: RRR, radial pulses equal and palpable, no BLE edema  Resp: Normal work of breathing on room air, CTAB Abd: Soft, non tender, non distended  Neuro: Alert & Oriented x 4  MSK: able to walk with slight limp from hips   ASSESSMENT/PLAN:   Currently attempting to quit smoking Patient is in contemplative stage. However, she is motivated.  - Nicotine patches  - Nicotine lozenges  - F/u in 1 month   Weight loss counseling, encounter for Patient has some barriers to losing weight due to her physical challenges including MS and scoliosis. Could benefit from improvement in diet.  - Counseled on healthy eating and exercise  - F/u in one month  - ambulatory referral to [facility].    Completed Pap smear today will follow up with results.   Lockie Mola, MD [facility]

## 2022-04-30 NOTE — Progress Notes (Signed)
Pap

## 2022-05-03 DIAGNOSIS — N1831 Chronic kidney disease, stage 3a: Secondary | ICD-10-CM | POA: Diagnosis not present

## 2022-05-04 NOTE — Assessment & Plan Note (Signed)
Patient is in contemplative stage. However, she is motivated.  - Nicotine patches  - Nicotine lozenges  - F/u in 1 month

## 2022-05-04 NOTE — Assessment & Plan Note (Signed)
Patient has some barriers to losing weight due to her physical challenges including MS and scoliosis. Could benefit from improvement in diet.  - Counseled on healthy eating and exercise  - F/u in one month  - ambulatory referral [facility].

## 2022-05-06 LAB — CYTOLOGY - PAP
Adequacy: ABSENT
Comment: NEGATIVE
Diagnosis: NEGATIVE
High risk HPV: NEGATIVE

## 2022-05-18 ENCOUNTER — Other Ambulatory Visit: Payer: Self-pay

## 2022-05-20 ENCOUNTER — Other Ambulatory Visit: Payer: Self-pay

## 2022-05-20 MED ORDER — TRAZODONE HCL 100 MG PO TABS: ORAL_TABLET | ORAL | 2 refills | Status: DC

## 2022-05-20 NOTE — Telephone Encounter (Signed)
Patient is calling to check on the status of Trazodone being filled. It has been past the 48 hour time we ask for refill request. Patient is completely out of medicine.   Please call patient when refill has been sent to pharmacy.

## 2022-05-24 ENCOUNTER — Telehealth: Payer: Self-pay | Admitting: *Deleted

## 2022-05-25 ENCOUNTER — Telehealth: Payer: Medicare PPO | Admitting: Neurology

## 2022-05-25 NOTE — Telephone Encounter (Signed)
Pt called later and was not able to keep appt. Rescheduled 08-24-2022.

## 2022-05-31 ENCOUNTER — Ambulatory Visit (INDEPENDENT_AMBULATORY_CARE_PROVIDER_SITE_OTHER): Payer: Medicare PPO | Admitting: Family Medicine

## 2022-05-31 ENCOUNTER — Encounter: Payer: Self-pay | Admitting: Family Medicine

## 2022-05-31 ENCOUNTER — Other Ambulatory Visit: Payer: Self-pay

## 2022-05-31 VITALS — BP 129/69 | HR 105 | Ht 69.0 in | Wt 181.6 lb

## 2022-05-31 DIAGNOSIS — R7303 Prediabetes: Secondary | ICD-10-CM | POA: Diagnosis not present

## 2022-05-31 DIAGNOSIS — Z713 Dietary counseling and surveillance: Secondary | ICD-10-CM

## 2022-05-31 LAB — POCT GLYCOSYLATED HEMOGLOBIN (HGB A1C): HbA1c, POC (prediabetic range): 5.8 % (ref 5.7–6.4)

## 2022-05-31 NOTE — Progress Notes (Signed)
    SUBJECTIVE:   CHIEF COMPLAINT / HPI:   Weight Loss  Patient has been attempting lifestyle interventions.  However she has not gone to schedule appointment as it was a little farther for her.  She has been taking Ozempic though hide called pharmacy to discontinue this medication from her list.  She says that she has been taking for weeks of 0.25 dose and has just started the 0.5 dose.  Says that she has lost some weight since then.  Denies denied any side effects.  PERTINENT  PMH / PSH: MS, Scoliosis, Prediabetes, Tobacco use, COPD, Bipolar 1   OBJECTIVE:   BP 129/69   Pulse (!) 105   Ht 5\' 9"  (1.753 m)   Wt 181 lb 9.6 oz (82.4 kg)   SpO2 100%   BMI 26.82 kg/m   General: well appearing, in no acute distress CV: RRR, radial pulses equal and palpable, no BLE edema  Resp: Normal work of breathing on room air, CTAB Abd: Soft, non tender, non distended  Neuro: Alert & Oriented x 4    ASSESSMENT/PLAN:   Weight loss counseling, encounter for Assessment & Plan: Patient had started ozempic though pharmacy was called to discontinue prescription as patient was actually found to fall into only prediabetic and not diabetic range.  - Continue diet and lifestyle changes that were discussed.  - Ambulatory referral [facility] made   Orders: -     POCT glycosylated hemoglobin (Hb A1C)  Prediabetes Assessment & Plan: A1c 5.8. Patient currently on Jardiance. She received ozempic after last visit though, called the pharmacy and told them that patient actually had prediabetes and not diabetes and discontinued prescription. She is currently controlled, and could benefit from continuing GLP but will not be covered under current insurance.  - Continue weight loss plan as above  - Continue jardiance as will also benefit heart.   Orders: -     POCT glycosylated hemoglobin (Hb A1C)      Lockie Mola, MD [facility]

## 2022-05-31 NOTE — Assessment & Plan Note (Signed)
A1c 5.8. Patient currently on Jardiance. She received ozempic after last visit though, called the pharmacy and told them that patient actually had prediabetes and not diabetes and discontinued prescription. She is currently controlled, and could benefit from continuing GLP but will not be covered under current insurance.  - Continue weight loss plan as above  - Continue jardiance as will also benefit heart.

## 2022-05-31 NOTE — Assessment & Plan Note (Signed)
Patient had started ozempic though pharmacy was called to discontinue prescription as patient was actually found to fall into only prediabetic and not diabetic range.  - Continue diet and lifestyle changes that were discussed.  - Ambulatory referral [facility] made

## 2022-06-21 ENCOUNTER — Other Ambulatory Visit: Payer: Self-pay

## 2022-06-21 DIAGNOSIS — I10 Essential (primary) hypertension: Secondary | ICD-10-CM

## 2022-06-21 MED ORDER — OLMESARTAN-AMLODIPINE-HCTZ 20-5-12.5 MG PO TABS: 1.0000 | ORAL_TABLET | Freq: Every day | ORAL | 1 refills | Status: DC

## 2022-07-16 ENCOUNTER — Other Ambulatory Visit: Payer: Self-pay | Admitting: Family Medicine

## 2022-08-02 DIAGNOSIS — I129 Hypertensive chronic kidney disease with stage 1 through stage 4 chronic kidney disease, or unspecified chronic kidney disease: Secondary | ICD-10-CM | POA: Diagnosis not present

## 2022-08-02 DIAGNOSIS — N1831 Chronic kidney disease, stage 3a: Secondary | ICD-10-CM | POA: Diagnosis not present

## 2022-08-02 DIAGNOSIS — N2581 Secondary hyperparathyroidism of renal origin: Secondary | ICD-10-CM | POA: Diagnosis not present

## 2022-08-02 DIAGNOSIS — D631 Anemia in chronic kidney disease: Secondary | ICD-10-CM | POA: Diagnosis not present

## 2022-08-23 ENCOUNTER — Encounter: Payer: Self-pay | Admitting: *Deleted

## 2022-08-24 ENCOUNTER — Telehealth: Payer: Medicare PPO | Admitting: Neurology

## 2022-08-24 ENCOUNTER — Encounter: Payer: Self-pay | Admitting: Neurology

## 2022-08-24 DIAGNOSIS — R0683 Snoring: Secondary | ICD-10-CM | POA: Diagnosis not present

## 2022-08-24 DIAGNOSIS — G35 Multiple sclerosis: Secondary | ICD-10-CM

## 2022-08-24 DIAGNOSIS — R930 Abnormal findings on diagnostic imaging of skull and head, not elsewhere classified: Secondary | ICD-10-CM

## 2022-08-24 DIAGNOSIS — I639 Cerebral infarction, unspecified: Secondary | ICD-10-CM | POA: Diagnosis not present

## 2022-08-24 DIAGNOSIS — Z9189 Other specified personal risk factors, not elsewhere classified: Secondary | ICD-10-CM

## 2022-08-24 NOTE — Patient Instructions (Signed)
It was nice to see you again today.  As discussed, we will proceed with a sleep study through our sleep lab to evaluate for obstructive sleep apnea.  We will be in touch after we get insurance approval and schedule you for an overnight stay here in our sleep lab.

## 2022-08-24 NOTE — Progress Notes (Signed)
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  [name] is unaccompanied today and joins via phone from her work place.  I am located in my office at [facility], utilizing my work laptop for this visit.  I first met [name] at Kimberly request of Dr. Terrace Arabia on 06/29/2021, at which time she reported snoring and a family history of sleep apnea.  She was advised to proceed with a sleep study.  She did not pursue sleep testing at Kimberly time.  Today, 08/24/2022: She reports snoring and recent Dx of RRMS.  Her Epworth sleepiness score in May 2024 was 0 out of 24. Her BT is 11 PM, WT is 6:30 AM. No nightly nocturia, denies recurrent nocturnal or morning headaches.  She does take trazodone 100 mg at night, she also takes Lamictal 200 mg at bedtime currently.  She is scheduled for her first full dose of Ocrevus this week in our office.  Previously:   06/29/21: (She) reports snoring and excessive daytime somnolence. I reviewed your office note from 04/28/2021.  Her Epworth sleepiness score is 3 out of 24, fatigue severity score is 14 out of 63.  She has an uncle with sleep apnea.  She has been snoring for years.  Bedtime is 11 PM and rise time around 6 AM.  She lives with her sister.  She has no night to night nocturia or recurrent morning or nocturnal headaches.  She works in Audiological scientist.  She currently smokes about 1/4 pack/day to up to half a pack per day.  She previously was able  to quit briefly.  She is working on smoking cessation.  She drinks caffeine in Kimberly form of coffee, 1 cup in Kimberly morning and 1 soda at lunchtime.  She drinks alcohol rarely.  She had a tonsillectomy and adenoidectomy as a child.   Her Past Medical History Is Significant For: Past Medical History:  Diagnosis Date   Arthritis    Bipolar 1 disorder (HCC)    Carotid artery occlusion    Chronic kidney disease    donated left kidney   Heart murmur    Hypertension    Multiple sclerosis (HCC)    Pneumonia 2010   had 2 episodes/ was sent home on O2   Prediabetes    Pulmonary embolism (HCC) 1994   Only 1 clot, not on lifelong anticoagulation   Stroke [facility])    TIA    Her Past Surgical History Is Significant For: Past Surgical History:  Procedure Laterality Date   ANTERIOR CERVICAL DECOMP/DISCECTOMY FUSION N/A 02/25/2021   Procedure: Anterior Cervical Discectomy Fusion - Cervical Three-Cervical Four;  Surgeon: Tia Alert, MD;  Location[facility] OR;  Service: Neurosurgery;  Laterality: N/A;  Anterior Cervical Discectomy Fusion - Cervical Three-Cervical Four   APPENDECTOMY  2001   ENDARTERECTOMY Left 08/20/2020   Procedure: LEFT CAROTID ARTERY ENDARTERECTOMY;  Surgeon: Leonie Douglas, MD;  Location: [facility] OR;  Service: Vascular;  Laterality: Left;   Endometriosis s/p left fallopian tube removal.  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 OR;  Service: Vascular;  Laterality: Left;   TONSILLECTOMY      Her Family History Is Significant For: Family History  Problem Relation Age of Onset   Breast cancer Mother    CVA Mother    Diabetes Sister    Kidney disease Sister    Hodgkin's lymphoma Sister    Breast cancer Maternal Grandmother    Colon cancer Paternal Grandmother    Breast cancer Maternal Aunt     Her Social History Is  Significant For: Social History   Socioeconomic History   Marital status: Divorced    Spouse name: Not on file   Number of children: 2   Years of education: Not on file   Highest education level: Bachelor's degree (e.g., BA, AB, BS)  Occupational History   Occupation: Part time  Tobacco Use   Smoking status: Every Day    Current packs/day: 0.50    Average packs/day: 0.5 packs/day for 36.0 years (18.0 ttl pk-yrs)    Types: Cigarettes   Smokeless tobacco: Never  Vaping Use   Vaping status: Former  Substance and Sexual Activity   Alcohol use: Yes    Comment: occassionally   Drug use: No   Sexual activity: Yes    Birth control/protection: Condom    Comment: Partner with Vasectomy  Other Topics Concern   Not on file  Social History Narrative   Not on file   Social Determinants of Health   Financial Resource Strain: Low Risk  (02/15/2022)   Overall Financial Resource Strain (CARDIA)    Difficulty of Paying Living Expenses: Not hard at all  Food Insecurity: No Food Insecurity (04/30/2022)   Hunger Vital Sign    Worried About Running Out of Food in Kimberly Last Year: Never true    Ran Out of Food in Kimberly Last Year: Never true  Transportation Needs: No Transportation Needs (04/30/2022)   PRAPARE - Administrator, Civil Service (Medical): No    Lack of Transportation (Non-Medical): No  Physical Activity: Insufficiently Active (04/30/2022)   Exercise Vital Sign    Days of Exercise per Week: 1 day    Minutes of Exercise per Session: 30 min  Stress: No Stress Concern Present (04/30/2022)   [employer] of Occupational Health - Occupational Stress Questionnaire    Feeling of Stress : Only a little  Social Connections: Moderately Integrated (04/30/2022)   Social Connection and Isolation Panel [NHANES]    Frequency of Communication with Friends and Family: More than three times a week    Frequency of Social Gatherings with Friends and Family: Once a week    Attends  Religious Services: More than 4 times per year    Active Member of [employer] or Organizations: Yes    Attends Engineer, structural: More than 4 times per year    Marital Status: Divorced    Her Allergies Are:  Allergies  Allergen Reactions   Iohexol Hives    Hives 06/06/04, needs pre meds    Morphine And Codeine Hives and Itching    Denies Airway involvement    pt-says she's not allergic to this medication   Diazepam Rash   Sulfa Antibiotics Itching and Rash    Reported with TMP/ SMX  Denies Airway involvement  :   Her Current Medications  Are:  Outpatient Encounter Medications as of 08/24/2022  Medication Sig   albuterol (VENTOLIN HFA) 108 (90 Base) MCG/ACT inhaler TAKE 2 PUFFS BY MOUTH EVERY 6 HOURS AS NEEDED FOR WHEEZE   aspirin EC 81 MG EC tablet Take 1 tablet (81 mg total) by mouth daily. Swallow whole.   atorvastatin (LIPITOR) 80 MG tablet Take 1 tablet (80 mg total) by mouth daily.   diclofenac Sodium (VOLTAREN ARTHRITIS PAIN) 1 % GEL Apply 2 g topically 4 (four) times daily.   JARDIANCE 10 MG TABS tablet Take 10 mg by mouth daily.   lamoTRIgine (LAMICTAL) 200 MG tablet TAKE 1 TABLET BY MOUTH AT BEDTIME   nicotine (NICODERM CQ - DOSED IN MG/24 HOURS) 14 mg/24hr patch Place 1 patch (14 mg total) onto Kimberly skin daily.   nicotine polacrilex (NICORETTE) 2 MG gum Take 1 each (2 mg total) by mouth as needed for smoking cessation.   Olmesartan-amLODIPine-HCTZ 20-5-12.5 MG TABS Take 1 tablet by mouth daily.   Polyethyl Glycol-Propyl Glycol (SYSTANE OP) Place 1 drop into both eyes in Kimberly morning.   traZODone (DESYREL) 100 MG tablet Take one tablet daily   No facility-administered encounter medications on file as of 08/24/2022.  :  Review of Systems:  Out of a complete 14 point review of systems, all are reviewed and negative with Kimberly exception of these symptoms as listed below:  Virtual Visit via Video Note on 08/24/2022:   I connected with Marisue Brooklyn on 08/24/22 at  2:45  PM EDT by a video enabled telemedicine application and verified that I am speaking with Kimberly correct person using two identifiers.   I discussed Kimberly limitations of evaluation and management by telemedicine and Kimberly availability of in person appointments. [name] expressed understanding and agreed to proceed.  History of Present Illness:  See above.  Observations/Objective: Pleasant, conversant, no acute distress, normal facial symmetry, extraocular movements fairly well preserved, hearing grossly intact, able to speak in full sentences, no labored breathing, no tachypnea.  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  We talked about this diagnosis, its prognosis and treatment options including surgical and nonsurgical options.  She would be willing to proceed with a sleep study at this time.  I will order a nocturnal polysomnogram and we will call her to schedule her sleep study.  She would be willing to try PAP therapy if Kimberly need arises.  We will plan a follow-up after testing.  I answered all her questions today and she was in agreement with our plan.  Follow Up Instructions:    I discussed Kimberly assessment and treatment plan with [name]. [name] was provided an opportunity to ask questions and all were answered. [name] agreed with Kimberly plan and demonstrated an understanding of Kimberly instructions.   [name] was advised to call back or seek an in-person evaluation  if Kimberly symptoms worsen or if Kimberly condition fails to improve as anticipated.  I provided 30 minutes of non-face-to-face time during this encounter.   Huston Foley, MD

## 2022-08-25 ENCOUNTER — Other Ambulatory Visit: Payer: Self-pay | Admitting: Family Medicine

## 2022-08-25 DIAGNOSIS — Z0000 Encounter for general adult medical examination without abnormal findings: Secondary | ICD-10-CM

## 2022-08-26 DIAGNOSIS — G35 Multiple sclerosis: Secondary | ICD-10-CM | POA: Diagnosis not present

## 2022-09-03 DIAGNOSIS — L811 Chloasma: Secondary | ICD-10-CM | POA: Diagnosis not present

## 2022-09-03 DIAGNOSIS — D2230 Melanocytic nevi of unspecified part of face: Secondary | ICD-10-CM | POA: Diagnosis not present

## 2022-09-08 ENCOUNTER — Telehealth: Payer: Self-pay | Admitting: Neurology

## 2022-09-08 NOTE — Telephone Encounter (Signed)
[employer]kley Harvey: [id number] (exp. 08/30/22 to 10/29/22)  Patient is scheduled at [facility] for 12/05/22 at 9 pm.  I will redo the authorization once the appt gets closer.  Mailed packet to the patient.

## 2022-09-23 DIAGNOSIS — M5412 Radiculopathy, cervical region: Secondary | ICD-10-CM | POA: Diagnosis not present

## 2022-09-27 ENCOUNTER — Ambulatory Visit
Admission: RE | Admit: 2022-09-27 | Discharge: 2022-09-27 | Disposition: A | Discharge status: Home / Self Care | Payer: Medicare PPO | Source: Ambulatory Visit | Attending: Family Medicine | Admitting: Family Medicine

## 2022-09-27 DIAGNOSIS — Z1231 Encounter for screening mammogram for malignant neoplasm of breast: Secondary | ICD-10-CM | POA: Diagnosis not present

## 2022-09-27 DIAGNOSIS — Z0000 Encounter for general adult medical examination without abnormal findings: Secondary | ICD-10-CM

## 2022-09-28 ENCOUNTER — Telehealth: Payer: Self-pay | Admitting: Neurology

## 2022-09-28 NOTE — Telephone Encounter (Signed)
Lvm and sent mychart msg informing pt of need to reschedule 10/14/22 appt - NP out

## 2022-10-14 ENCOUNTER — Ambulatory Visit: Payer: Medicare PPO | Admitting: Neurology

## 2022-11-03 ENCOUNTER — Telehealth: Payer: Self-pay | Admitting: Neurology

## 2022-11-03 NOTE — Telephone Encounter (Signed)
NPSG- [employer]kley Harvey: [id number] (exp. 11/03/22 to 02/10/23)   Patient is scheduled at [facility] for 12/05/22.
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
## 2022-11-23 NOTE — Telephone Encounter (Signed)
Patient called and needed to r/s her SS.   She has been r/s for 12/27/22 at 8 pm.  Mailed new packet to the patient.

## 2022-11-24 ENCOUNTER — Telehealth: Payer: Medicare PPO | Admitting: Neurology

## 2022-11-24 DIAGNOSIS — G35 Multiple sclerosis: Secondary | ICD-10-CM | POA: Diagnosis not present

## 2022-11-24 DIAGNOSIS — Z9189 Other specified personal risk factors, not elsewhere classified: Secondary | ICD-10-CM | POA: Diagnosis not present

## 2022-11-24 DIAGNOSIS — M4802 Spinal stenosis, cervical region: Secondary | ICD-10-CM

## 2022-11-24 DIAGNOSIS — I639 Cerebral infarction, unspecified: Secondary | ICD-10-CM

## 2022-11-24 NOTE — Patient Instructions (Addendum)
Please come by the office to have blood work drawn.  Will plan to repeat MRI of the brain in February 2025.  We will continue Ocrevus.  Please keep appointment for sleep apnea testing.

## 2022-11-25 ENCOUNTER — Encounter: Payer: Self-pay | Admitting: Neurology

## 2022-11-29 ENCOUNTER — Other Ambulatory Visit: Payer: Self-pay | Admitting: Family Medicine

## 2022-11-29 ENCOUNTER — Other Ambulatory Visit (INDEPENDENT_AMBULATORY_CARE_PROVIDER_SITE_OTHER): Payer: Self-pay

## 2022-11-29 DIAGNOSIS — G35 Multiple sclerosis: Secondary | ICD-10-CM

## 2022-11-29 DIAGNOSIS — I10 Essential (primary) hypertension: Secondary | ICD-10-CM

## 2022-11-29 DIAGNOSIS — Z0289 Encounter for other administrative examinations: Secondary | ICD-10-CM

## 2022-11-29 MED ORDER — ATORVASTATIN CALCIUM 80 MG PO TABS: 80.0000 mg | ORAL_TABLET | Freq: Every day | ORAL | 2 refills | Status: DC

## 2022-11-29 NOTE — Telephone Encounter (Signed)
Pended medication to encounter.   Veronda Prude, RN

## 2022-11-29 NOTE — Telephone Encounter (Signed)
Patient called stating she has been waiting on refill for medication, she states [facility] has been sending it, I told her we have not received anything.   Atorvastatin 80mg .  She is COMPLETELY out and needing it ASAP.   Refill is to be sent to [facility] on [address]  Please Advise.   Thanks!

## 2022-11-30 LAB — CBC WITH DIFFERENTIAL/PLATELET
Basophils Absolute: 0.1 10*3/uL (ref 0.0–0.2)
Basos: 1 %
EOS (ABSOLUTE): 0.1 10*3/uL (ref 0.0–0.4)
Eos: 2 %
Hematocrit: 40.6 % (ref 34.0–46.6)
Hemoglobin: 13.5 g/dL (ref 11.1–15.9)
Immature Grans (Abs): 0.1 10*3/uL (ref 0.0–0.1)
Immature Granulocytes: 1 %
Lymphocytes Absolute: 3.3 10*3/uL — ABNORMAL HIGH (ref 0.7–3.1)
Lymphs: 39 %
MCH: 27.4 pg (ref 26.6–33.0)
MCHC: 33.3 g/dL (ref 31.5–35.7)
MCV: 82 fL (ref 79–97)
Monocytes Absolute: 0.7 10*3/uL (ref 0.1–0.9)
Monocytes: 8 %
Neutrophils Absolute: 4.1 10*3/uL (ref 1.4–7.0)
Neutrophils: 49 %
Platelets: 283 10*3/uL (ref 150–450)
RBC: 4.93 x10E6/uL (ref 3.77–5.28)
RDW: 14.1 % (ref 11.7–15.4)
WBC: 8.3 10*3/uL (ref 3.4–10.8)

## 2022-11-30 LAB — COMPREHENSIVE METABOLIC PANEL
ALT: 12 [IU]/L (ref 0–32)
AST: 18 [IU]/L (ref 0–40)
Albumin: 4.4 g/dL (ref 3.8–4.9)
Alkaline Phosphatase: 107 [IU]/L (ref 44–121)
BUN/Creatinine Ratio: 18 (ref 9–23)
BUN: 29 mg/dL — ABNORMAL HIGH (ref 6–24)
Bilirubin Total: 0.4 mg/dL (ref 0.0–1.2)
CO2: 22 mmol/L (ref 20–29)
Calcium: 9.6 mg/dL (ref 8.7–10.2)
Chloride: 104 mmol/L (ref 96–106)
Creatinine, Ser: 1.57 mg/dL — ABNORMAL HIGH (ref 0.57–1.00)
Globulin, Total: 2.9 g/dL (ref 1.5–4.5)
Glucose: 97 mg/dL (ref 70–99)
Potassium: 4.6 mmol/L (ref 3.5–5.2)
Sodium: 141 mmol/L (ref 134–144)
Total Protein: 7.3 g/dL (ref 6.0–8.5)
eGFR: 38 mL/min/{1.73_m2} — ABNORMAL LOW (ref >59)

## 2022-11-30 LAB — IGG, IGA, IGM
IgA/Immunoglobulin A, Serum: 411 mg/dL — ABNORMAL HIGH (ref 87–352)
IgG (Immunoglobin G), Serum: 1124 mg/dL (ref 586–1602)
IgM (Immunoglobulin M), Srm: 170 mg/dL (ref 26–217)

## 2022-12-07 DIAGNOSIS — N1831 Chronic kidney disease, stage 3a: Secondary | ICD-10-CM | POA: Diagnosis not present

## 2022-12-07 DIAGNOSIS — N2581 Secondary hyperparathyroidism of renal origin: Secondary | ICD-10-CM | POA: Diagnosis not present

## 2022-12-07 DIAGNOSIS — D631 Anemia in chronic kidney disease: Secondary | ICD-10-CM | POA: Diagnosis not present

## 2022-12-07 DIAGNOSIS — I129 Hypertensive chronic kidney disease with stage 1 through stage 4 chronic kidney disease, or unspecified chronic kidney disease: Secondary | ICD-10-CM | POA: Diagnosis not present

## 2022-12-14 NOTE — Telephone Encounter (Signed)
Patient called in stating she needed to r/s  She is r/s for 01/02/23 at 9 pm  mailed new packet to the patient.

## 2022-12-22 ENCOUNTER — Telehealth: Payer: Self-pay | Admitting: Neurology

## 2022-12-22 NOTE — Telephone Encounter (Signed)
I would recommend she be evaluated by primary care or urgent care if symptoms are that significant. Thanks

## 2022-12-22 NOTE — Telephone Encounter (Signed)
Pt is asking for a call to discuss pain in left big toe and toe next to it. Pt also having hip pain since last night, please call.

## 2022-12-22 NOTE — Telephone Encounter (Signed)
Returned call to patient, she asked when Dr. Terrace Arabia had an opening and I advised not until March. She agreed to follow up with PCP but didn't seem happy about it. Advised to call back with any other concerns or questions. Patient verbalized understanding.

## 2022-12-22 NOTE — Telephone Encounter (Signed)
Returned call to patient, she states the pain in her left hip and left big toes started yesterday and it is like a cramping sensation. She states she has used hydrocodone and voltaren gel but didn't get relief. She attempted jury dury today and stated she had to leave. She is asking to reconsider jury duty excuse note and something to help with pain. Advised I would send to Maralyn Sago for review.

## 2023-01-02 ENCOUNTER — Ambulatory Visit: Payer: Medicare PPO | Admitting: Neurology

## 2023-01-02 ENCOUNTER — Telehealth: Payer: Self-pay

## 2023-01-02 NOTE — Telephone Encounter (Signed)
Pt arrived at sleep lab with weave in. I was unable to get to her scalp to put wires on. The patient said she did not read in the information packet that she was to have her hair in its natural state. The patient would like to be rescheduled.

## 2023-01-03 NOTE — Telephone Encounter (Signed)
Sent a mychart to r/s.

## 2023-01-14 ENCOUNTER — Telehealth: Payer: Self-pay

## 2023-01-14 ENCOUNTER — Ambulatory Visit: Payer: Medicare PPO | Admitting: Family Medicine

## 2023-01-14 ENCOUNTER — Encounter: Payer: Self-pay | Admitting: Family Medicine

## 2023-01-14 VITALS — BP 100/68 | HR 98 | Ht 69.0 in | Wt 177.6 lb

## 2023-01-14 DIAGNOSIS — Z0000 Encounter for general adult medical examination without abnormal findings: Secondary | ICD-10-CM | POA: Diagnosis not present

## 2023-01-14 DIAGNOSIS — Z122 Encounter for screening for malignant neoplasm of respiratory organs: Secondary | ICD-10-CM | POA: Diagnosis not present

## 2023-01-14 DIAGNOSIS — Z1211 Encounter for screening for malignant neoplasm of colon: Secondary | ICD-10-CM | POA: Diagnosis not present

## 2023-01-14 DIAGNOSIS — Z524 Kidney donor: Secondary | ICD-10-CM

## 2023-01-14 DIAGNOSIS — R7303 Prediabetes: Secondary | ICD-10-CM

## 2023-01-14 DIAGNOSIS — I10 Essential (primary) hypertension: Secondary | ICD-10-CM

## 2023-01-14 DIAGNOSIS — E119 Type 2 diabetes mellitus without complications: Secondary | ICD-10-CM | POA: Diagnosis not present

## 2023-01-14 MED ORDER — AMLODIPINE-OLMESARTAN 5-20 MG PO TABS: 1.0000 | ORAL_TABLET | Freq: Every day | ORAL | 0 refills | Status: DC

## 2023-01-14 MED ORDER — SEMAGLUTIDE(0.25 OR 0.5MG/DOS) 2 MG/1.5ML ~~LOC~~ SOPN: 0.5000 mg | PEN_INJECTOR | SUBCUTANEOUS | 3 refills | Status: DC
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
## 2023-01-14 NOTE — Telephone Encounter (Signed)
 Rec'd PA request for patients Ozempic medication.   Medication requires a dx of diabetes.

## 2023-01-14 NOTE — Patient Instructions (Signed)
 It was wonderful to see you today.  Please bring ALL of your medications with you to every visit.   Today we talked about:  Blood pressure was low so I changed your medicine to just have olmesartan  and amlodipine .   Please make an appointment with the nurses to check your blood pressure in one to two weeks as well as a lab appointment that day as well. I have already ordered the lab you just need to make the appointment to get it done at that time. I will let your nephrologist know that I have made this change.   I have already ordered a referral for the colonoscopy and the CT for your lungs they will call you to schedule this so look out for this call.   Thank you for choosing [facility].   Please call [phone] with any questions about today's appointment.  Please be sure to schedule follow up at the front desk before you leave today.   Areta Saliva, MD  Family Medicine

## 2023-01-18 ENCOUNTER — Encounter: Payer: Self-pay | Admitting: Family Medicine

## 2023-01-18 NOTE — Assessment & Plan Note (Signed)
 Patient is well controlled A1c with ozempic and metformin.  - Continue ozempic

## 2023-01-18 NOTE — Telephone Encounter (Signed)
 I called and spoke with the patient to r/s her SS, she stated she will call me back to r/s. She has my number.

## 2023-01-18 NOTE — Assessment & Plan Note (Signed)
 Patient could be over treated as her CKD progresses. No symptoms of dizziness or ortho stasis otherwise.  - discontinue hydrochlorothiazide from combo, prescribed amlodipine - olmesartan 5-20 - BMP  - Follow up in one month

## 2023-01-21 ENCOUNTER — Other Ambulatory Visit: Payer: Self-pay

## 2023-01-21 ENCOUNTER — Ambulatory Visit: Payer: Self-pay

## 2023-01-27 ENCOUNTER — Other Ambulatory Visit: Payer: Medicare PPO

## 2023-01-27 ENCOUNTER — Ambulatory Visit (INDEPENDENT_AMBULATORY_CARE_PROVIDER_SITE_OTHER): Payer: Medicare PPO

## 2023-01-27 VITALS — BP 104/70 | HR 100

## 2023-01-27 DIAGNOSIS — Z524 Kidney donor: Secondary | ICD-10-CM | POA: Diagnosis not present

## 2023-01-27 DIAGNOSIS — I10 Essential (primary) hypertension: Secondary | ICD-10-CM | POA: Diagnosis not present

## 2023-01-27 DIAGNOSIS — Z0130 Encounter for examination of blood pressure without abnormal findings: Secondary | ICD-10-CM

## 2023-01-28 LAB — BASIC METABOLIC PANEL
BUN/Creatinine Ratio: 9 (ref 9–23)
BUN: 13 mg/dL (ref 6–24)
CO2: 22 mmol/L (ref 20–29)
Calcium: 9.6 mg/dL (ref 8.7–10.2)
Chloride: 102 mmol/L (ref 96–106)
Creatinine, Ser: 1.5 mg/dL — ABNORMAL HIGH (ref 0.57–1.00)
Glucose: 82 mg/dL (ref 70–99)
Potassium: 5 mmol/L (ref 3.5–5.2)
Sodium: 141 mmol/L (ref 134–144)
eGFR: 40 mL/min/{1.73_m2} — ABNORMAL LOW (ref >59)

## 2023-02-01 ENCOUNTER — Encounter: Payer: Self-pay | Admitting: Family Medicine

## 2023-02-02 ENCOUNTER — Other Ambulatory Visit: Payer: Self-pay | Admitting: Family Medicine

## 2023-02-02 DIAGNOSIS — R7303 Prediabetes: Secondary | ICD-10-CM

## 2023-02-02 NOTE — Progress Notes (Signed)
Patient presents to nurse clinic for BP check.   Today's Vitals   01/27/23 1448  BP: 104/70  Pulse: 100  SpO2: 99%   She reports taking medications as prescribed. Denies lightheadedness or dizziness.   Spoke with Dr. Pollie Meyer regarding patient. Advised that patient continue current regimen and follow up if she has any concerns or becomes symptomatic.  Assisted patient to lab for BMP.   Veronda Prude, RN

## 2023-02-03 ENCOUNTER — Ambulatory Visit
Admission: RE | Admit: 2023-02-03 | Discharge: 2023-02-03 | Disposition: A | Discharge status: Home / Self Care | Payer: Medicare PPO | Source: Ambulatory Visit | Attending: Family Medicine | Admitting: Family Medicine

## 2023-02-03 DIAGNOSIS — F17210 Nicotine dependence, cigarettes, uncomplicated: Secondary | ICD-10-CM | POA: Diagnosis not present

## 2023-02-03 DIAGNOSIS — Z122 Encounter for screening for malignant neoplasm of respiratory organs: Secondary | ICD-10-CM

## 2023-02-10 ENCOUNTER — Other Ambulatory Visit: Payer: Self-pay | Admitting: Family Medicine

## 2023-02-12 ENCOUNTER — Telehealth: Payer: Self-pay | Admitting: Family Medicine

## 2023-02-12 NOTE — Telephone Encounter (Signed)
Attempted to contact patient regarding CT chest results, left voicemail.  Low-dose CT for lung cancer screening showed possible nodular consolidation in the anterior inferior right upper lobe likely postinfectious scarring but recommend repeat CT in 6 months.  Will also send MyChart message to patient.  Elberta Fortis, DO

## 2023-02-14 ENCOUNTER — Ambulatory Visit (AMBULATORY_SURGERY_CENTER): Payer: Medicare PPO

## 2023-02-14 VITALS — Ht 69.0 in | Wt 175.0 lb

## 2023-02-14 DIAGNOSIS — Z800 Family history of malignant neoplasm of digestive organs: Secondary | ICD-10-CM

## 2023-02-14 DIAGNOSIS — Z860100 Personal history of colon polyps, unspecified: Secondary | ICD-10-CM

## 2023-02-14 MED ORDER — SUFLAVE 178.7 G PO SOLR: 1.0000 | ORAL | 0 refills | Status: DC

## 2023-02-14 NOTE — Progress Notes (Signed)

## 2023-02-15 ENCOUNTER — Telehealth: Payer: Self-pay | Admitting: Neurology

## 2023-02-15 DIAGNOSIS — G35 Multiple sclerosis: Secondary | ICD-10-CM

## 2023-02-15 NOTE — Telephone Encounter (Signed)
Sent patient message about ordering MRI brain for surveillance. Will clarify if she can have with contrast with allergy protocol, otherwise will do without.

## 2023-02-17 DIAGNOSIS — L700 Acne vulgaris: Secondary | ICD-10-CM | POA: Diagnosis not present

## 2023-02-17 NOTE — Addendum Note (Signed)
 Addended by: Wess Hammed on: 02/17/2023 04:04 PM   Modules accepted: Orders

## 2023-02-22 ENCOUNTER — Telehealth: Payer: Self-pay | Admitting: Neurology

## 2023-02-22 NOTE — Telephone Encounter (Signed)
[employer] Tracking #[id number] exp. 02/22/23-04/23/23 sent to GI [phone]

## 2023-02-24 DIAGNOSIS — G35 Multiple sclerosis: Secondary | ICD-10-CM | POA: Diagnosis not present

## 2023-03-04 ENCOUNTER — Telehealth: Payer: Self-pay | Admitting: Gastroenterology

## 2023-03-04 NOTE — Telephone Encounter (Signed)
Patient called and stated that she would like to know when she needs to stop taking her Aspirin. Patient is requesting a call back. Please advise.

## 2023-03-04 NOTE — Telephone Encounter (Signed)
Informed pt that she does not need to stop her 81mg  ASA. No other questions at this time.

## 2023-03-08 ENCOUNTER — Encounter: Payer: Self-pay | Admitting: Gastroenterology

## 2023-03-11 ENCOUNTER — Encounter: Payer: Self-pay | Admitting: Gastroenterology

## 2023-03-11 ENCOUNTER — Ambulatory Visit: Payer: Medicare PPO | Admitting: Gastroenterology

## 2023-03-11 ENCOUNTER — Telehealth: Payer: Self-pay

## 2023-03-11 VITALS — BP 99/55 | HR 120 | Temp 98.1°F | Ht 69.0 in | Wt 175.0 lb

## 2023-03-11 MED ORDER — SODIUM CHLORIDE 0.9 % IV SOLN: 500.0000 mL | INTRAVENOUS | Status: AC

## 2023-03-11 MED ORDER — SUFLAVE 178.7 G PO SOLR: 1.0000 | Freq: Once | ORAL | 0 refills | Status: AC

## 2023-03-11 MED ORDER — SUFLAVE 178.7 G PO SOLR: 1.0000 | Freq: Once | ORAL | 0 refills | Status: DC

## 2023-03-11 NOTE — Progress Notes (Signed)
 Pt's states no medical or surgical changes since previsit or office visit.  Pt states her stool is brown with some solid stool. MD notified and request to reschedule pt  with Mira lax for one week and double prep.

## 2023-03-11 NOTE — Progress Notes (Signed)
 Rescheduled pt to 03/29/23. New instructions printed and reviewed with pt. She is to do a capful of Miralax daily x5 days and a double prep with Miralax and Suflave. Prescription for Suflave sent to [facility] pharmacy. Stressed the importance of holding Semaglutide for 7 days.

## 2023-03-11 NOTE — Telephone Encounter (Signed)
 Patient calls nurse line regarding low BP reading at GI specialist today.   Reports that BP was 99/50. She is asymptomatic.   She is currently taking amlodipine-olmesartan for BP.   Advised patient that I would recommend being seen in office for BP follow up.   Scheduled patient for Monday morning with Dr. Phineas Real.   ED precautions discussed for over the weekend.   Veronda Prude, RN

## 2023-03-14 ENCOUNTER — Ambulatory Visit (INDEPENDENT_AMBULATORY_CARE_PROVIDER_SITE_OTHER): Payer: Medicare PPO | Admitting: Family Medicine

## 2023-03-14 ENCOUNTER — Encounter: Payer: Self-pay | Admitting: Family Medicine

## 2023-03-14 VITALS — BP 127/57 | HR 109 | Ht 69.0 in | Wt 171.2 lb

## 2023-03-14 DIAGNOSIS — J449 Chronic obstructive pulmonary disease, unspecified: Secondary | ICD-10-CM | POA: Diagnosis not present

## 2023-03-14 DIAGNOSIS — R7303 Prediabetes: Secondary | ICD-10-CM

## 2023-03-14 DIAGNOSIS — N1831 Chronic kidney disease, stage 3a: Secondary | ICD-10-CM

## 2023-03-14 DIAGNOSIS — I10 Essential (primary) hypertension: Secondary | ICD-10-CM | POA: Diagnosis not present

## 2023-03-14 DIAGNOSIS — E119 Type 2 diabetes mellitus without complications: Secondary | ICD-10-CM

## 2023-03-14 LAB — POCT GLYCOSYLATED HEMOGLOBIN (HGB A1C): HbA1c, POC (prediabetic range): 5.7 % (ref 5.7–6.4)

## 2023-03-14 MED ORDER — OLMESARTAN MEDOXOMIL 20 MG PO TABS: 20.0000 mg | ORAL_TABLET | Freq: Every day | ORAL | 3 refills | Status: DC

## 2023-03-14 MED ORDER — JARDIANCE 10 MG PO TABS: 10.0000 mg | ORAL_TABLET | Freq: Every day | ORAL | 1 refills | Status: DC

## 2023-03-14 MED ORDER — STIOLTO RESPIMAT 2.5-2.5 MCG/ACT IN AERS: 2.0000 | INHALATION_SPRAY | Freq: Every day | RESPIRATORY_TRACT | 0 refills | Status: DC

## 2023-03-14 NOTE — Assessment & Plan Note (Addendum)
 Diffuse inspiratory and expiratory wheezes on my exam today, though patient reassuringly comfortable with excellent O2 saturations.  Given reports of mild shortness of breath at times, will send in Stiolto and follow-up response.  She is slightly tachycardic today, though she does not endorse any chest pain and her saturations again look good, so less likely recurrent PE.  Discussed continuing to cut down on tobacco use and increasing nicotine supplementation as needed.

## 2023-03-14 NOTE — Assessment & Plan Note (Signed)
 Continuing olmesartan as above.  Repeating BMP today.

## 2023-03-14 NOTE — Assessment & Plan Note (Addendum)
 Given DBP on the softer end today with recent lower systolics and lightheadedness, will switch to olmesartan 20 mg daily from olmesartan-amlodipine daily.  Advised to keep [place] updated if she has any dizziness, chest pain, shortness of breath.  Return in 1 month for recheck.

## 2023-03-14 NOTE — Patient Instructions (Addendum)
 I have refilled your jardiance. Continue the ozempic. Your A1c is doing great! We switched to olmesartan ONLY. Do NOT taking the olmesartan-amlodipine. Come back in 1 month for recheck or sooner if you have dizziness, shortness of breath, or chest pain. I have also sent in Stiolto for your wheezing. This should continue to get better as you cut down on smoking. Keep up the great work!

## 2023-03-14 NOTE — Progress Notes (Signed)
    SUBJECTIVE:   CHIEF COMPLAINT / HPI:   HTN, CKD3 Taken off hydrochlorothiazide at last visit and placed on olmesartan-amlodipine 20-5 given concerns for diuretic effect on CKD. She had a BP recheck two weeks after this appointment with good control of BP. She called in last week with a BP of 99/50 without symptoms and felt she should be seen.  T2DM Was placed on Jardiance, but has some difficulty with the finances. She knows of a program with Medicare that can help her get this medication. She needs a refill.  Tobacco use Intermittently using nicotine patches when she sees her grandson. Also at times using nicotine gum. She continues to smoke. Does feel SOB at times.  PERTINENT  PMH / PSH: CVA, COPD, multiple sclerosis, HLD, bipolar 1 disorder, cervical stenosis, PE  OBJECTIVE:   BP (!) 127/57   Pulse (!) 109   Ht 5\' 9"  (1.753 m)   Wt 171 lb 3.2 oz (77.7 kg)   SpO2 100%   BMI 25.28 kg/m   General: Alert and oriented, in NAD Skin: Warm, dry, and intact without lesions HEENT: NCAT, EOM grossly normal, midline nasal septum Cardiac: Mildly tachycardic with normal rhythm, no m/r/g appreciated Respiratory: Inspiratory and expiratory wheezes throughout, breathing and speaking comfortably on RA Abdominal: Soft, nontender, nondistended, normoactive bowel sounds Extremities: Moves all extremities grossly equally Neurological: No gross focal deficit Psychiatric: Appropriate mood and affect   ASSESSMENT/PLAN:   HTN (hypertension) Given DBP on the softer end today with recent lower systolics and lightheadedness, will switch to olmesartan 20 mg daily from olmesartan-amlodipine daily.  Advised to keep [place] updated if she has any dizziness, chest pain, shortness of breath.  Return in 1 month for recheck.  COPD (chronic obstructive pulmonary disease) (HCC) Diffuse inspiratory and expiratory wheezes on my exam today, though patient reassuringly comfortable with excellent O2 saturations.   Given reports of mild shortness of breath at times, will send in Stiolto and follow-up response.  She is slightly tachycardic today, though she does not endorse any chest pain and her saturations again look good, so less likely recurrent PE.  Discussed continuing to cut down on tobacco use and increasing nicotine supplementation as needed.  Chronic kidney disease (CKD), stage III (moderate) (HCC) Continuing olmesartan as above.  Repeating BMP today.  Prediabetes A1c stable at 5.7 today.  Continue Ozempic.  Will also send in refill on Jardiance since she feels she will get this covered with her new insurance.   Health maintenance Will refer for Medicare annual wellness visit.  Janeal Holmes, MD [facility]

## 2023-03-14 NOTE — Assessment & Plan Note (Signed)
 A1c stable at 5.7 today.  Continue Ozempic.  Will also send in refill on Jardiance since she feels she will get this covered with her new insurance.

## 2023-03-15 LAB — BASIC METABOLIC PANEL
BUN/Creatinine Ratio: 8 — ABNORMAL LOW (ref 9–23)
BUN: 10 mg/dL (ref 6–24)
CO2: 24 mmol/L (ref 20–29)
Calcium: 9.4 mg/dL (ref 8.7–10.2)
Chloride: 103 mmol/L (ref 96–106)
Creatinine, Ser: 1.33 mg/dL — ABNORMAL HIGH (ref 0.57–1.00)
Glucose: 89 mg/dL (ref 70–99)
Potassium: 4.3 mmol/L (ref 3.5–5.2)
Sodium: 143 mmol/L (ref 134–144)
eGFR: 46 mL/min/{1.73_m2} — ABNORMAL LOW (ref >59)

## 2023-03-16 ENCOUNTER — Encounter: Payer: Self-pay | Admitting: Family Medicine

## 2023-03-21 ENCOUNTER — Ambulatory Visit
Admission: RE | Admit: 2023-03-21 | Discharge: 2023-03-21 | Disposition: A | Discharge status: Home / Self Care | Source: Ambulatory Visit | Attending: Neurology | Admitting: Neurology

## 2023-03-21 DIAGNOSIS — G35 Multiple sclerosis: Secondary | ICD-10-CM

## 2023-03-22 ENCOUNTER — Encounter: Payer: Self-pay | Admitting: Neurology

## 2023-03-22 ENCOUNTER — Encounter: Payer: Self-pay | Admitting: Gastroenterology

## 2023-03-29 ENCOUNTER — Encounter: Payer: Self-pay | Admitting: Gastroenterology

## 2023-03-29 ENCOUNTER — Ambulatory Visit: Payer: Medicare PPO | Admitting: Gastroenterology

## 2023-03-29 VITALS — BP 111/66 | HR 90 | Temp 98.1°F | Resp 23 | Ht 69.0 in | Wt 175.0 lb

## 2023-03-29 DIAGNOSIS — Z1211 Encounter for screening for malignant neoplasm of colon: Secondary | ICD-10-CM

## 2023-03-29 DIAGNOSIS — G35 Multiple sclerosis: Secondary | ICD-10-CM | POA: Diagnosis not present

## 2023-03-29 DIAGNOSIS — K5730 Diverticulosis of large intestine without perforation or abscess without bleeding: Secondary | ICD-10-CM | POA: Diagnosis not present

## 2023-03-29 DIAGNOSIS — K635 Polyp of colon: Secondary | ICD-10-CM | POA: Diagnosis not present

## 2023-03-29 DIAGNOSIS — K648 Other hemorrhoids: Secondary | ICD-10-CM | POA: Diagnosis not present

## 2023-03-29 DIAGNOSIS — K644 Residual hemorrhoidal skin tags: Secondary | ICD-10-CM | POA: Diagnosis not present

## 2023-03-29 DIAGNOSIS — Z860100 Personal history of colon polyps, unspecified: Secondary | ICD-10-CM

## 2023-03-29 DIAGNOSIS — F319 Bipolar disorder, unspecified: Secondary | ICD-10-CM | POA: Diagnosis not present

## 2023-03-29 DIAGNOSIS — D123 Benign neoplasm of transverse colon: Secondary | ICD-10-CM

## 2023-03-29 DIAGNOSIS — Z860101 Personal history of adenomatous and serrated colon polyps: Secondary | ICD-10-CM

## 2023-03-29 DIAGNOSIS — I10 Essential (primary) hypertension: Secondary | ICD-10-CM | POA: Diagnosis not present

## 2023-03-29 DIAGNOSIS — E119 Type 2 diabetes mellitus without complications: Secondary | ICD-10-CM | POA: Diagnosis not present

## 2023-03-29 MED ORDER — SODIUM CHLORIDE 0.9 % IV SOLN: 500.0000 mL | Freq: Once | INTRAVENOUS | Status: DC

## 2023-03-29 NOTE — Progress Notes (Unsigned)
 A/o x 3, VSS, gd SR's, pleased with anesthesia, report to RN

## 2023-03-29 NOTE — Progress Notes (Unsigned)
 Called to room to assist during endoscopic procedure.  Patient ID and intended procedure confirmed with present staff. Received instructions for my participation in the procedure from the performing physician.

## 2023-03-29 NOTE — Progress Notes (Unsigned)
  Gastroenterology History and Physical   Primary Care Physician:  Lockie Mola, MD   Reason for Procedure:  History of adenomatous colon polyps  Plan:    Surveillance colonoscopy with possible interventions as needed     HPI: [name] is a very pleasant 58 y.o. female here for surveillance colonoscopy. Denies any nausea, vomiting, abdominal pain, melena or bright red blood per rectum  The risks and benefits as well as alternatives of endoscopic procedure(s) have been discussed and reviewed. All questions answered. The patient agrees to proceed.    Past Medical History:  Diagnosis Date   Arthritis    Bipolar 1 disorder (HCC)    Carotid artery occlusion    Chronic kidney disease    donated left kidney   Diabetes mellitus without complication (HCC)    Heart murmur    Hyperlipidemia    Hypertension    Multiple sclerosis (HCC)    Neuromuscular disorder (HCC)    Pneumonia 2010   had 2 episodes/ was sent home on O2   Prediabetes    Pulmonary embolism (HCC) 1994   Only 1 clot, not on lifelong anticoagulation   Stroke [facility])    TIA    Past Surgical History:  Procedure Laterality Date   ANTERIOR CERVICAL DECOMP/DISCECTOMY FUSION N/A 02/25/2021   Procedure: Anterior Cervical Discectomy Fusion - Cervical Three-Cervical Four;  Surgeon: Tia Alert, MD;  Location: [facility] OR;  Service: Neurosurgery;  Laterality: N/A;  Anterior Cervical Discectomy Fusion - Cervical Three-Cervical Four   APPENDECTOMY  2001   ENDARTERECTOMY Left 08/20/2020   Procedure: LEFT CAROTID ARTERY ENDARTERECTOMY;  Surgeon: Leonie Douglas, MD;  Location: [facility] OR;  Service: Vascular;  Laterality: Left;   Endometriosis s/p left fallopian tube removal.  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 Authorizing Provider  aspirin EC 81 MG EC tablet Take 1 tablet (81 mg total) by mouth daily. Swallow whole. 08/17/20  Yes Hollice Espy, MD  atorvastatin (LIPITOR) 80 MG tablet Take 1 tablet (80 mg total) by mouth daily. 11/29/22  Yes Lockie Mola, MD  lamoTRIgine (LAMICTAL) 200 MG tablet TAKE 1 TABLET BY MOUTH AT BEDTIME 07/19/22  Yes Lockie Mola, MD  nicotine polacrilex (NICORETTE) 2 MG gum Take 1 each (2 mg total) by mouth as needed for smoking cessation. 04/30/22  Yes Lockie Mola, MD  olmesartan (BENICAR) 20 MG tablet Take 1 tablet (20 mg total) by mouth at bedtime. 03/14/23  Yes Mabe, Earvin Hansen, MD  Polyethyl Glycol-Propyl Glycol (SYSTANE OP) Place 1 drop into both eyes in the morning.   Yes [provider]  Tiotropium Bromide-Olodaterol (STIOLTO RESPIMAT) 2.5-2.5 MCG/ACT AERS Inhale 2 puffs into the lungs daily. 03/14/23  Yes Mabe, Earvin Hansen, MD  traZODone (DESYREL) 100 MG tablet TAKE 1 TABLET BY MOUTH DAILY 02/10/23  Yes Elberta Fortis, MD  Vitamin E 180 MG (400 UNIT) CAPS Take 1 capsule by mouth daily. Pt states she is unsure about strength   Yes [provider]  albuterol (VENTOLIN HFA) 108 (90 Base) MCG/ACT inhaler TAKE 2 PUFFS BY MOUTH EVERY 6 HOURS AS NEEDED FOR WHEEZE Patient not taking: Reported on 03/29/2023 02/12/19  Sandre Kitty, MD  diclofenac Sodium (VOLTAREN ARTHRITIS PAIN) 1 % GEL Apply 2 g topically 4 (four) times daily. Patient not taking: Reported on 03/29/2023 04/30/22   Lockie Mola, MD  JARDIANCE 10 MG TABS tablet Take 1 tablet (10 mg total) by mouth daily. Patient not taking: Reported on 03/29/2023 03/14/23   Evette Georges, MD  nicotine (NICODERM CQ - DOSED IN MG/24 HOURS) 14 mg/24hr patch Place 1 patch (14 mg total) onto the skin daily. 04/30/22   Lockie Mola, MD  Semaglutide,0.25 or 0.5MG /DOS, 2 MG/1.5ML SOPN Inject 0.5 mg into the skin once a week. 0.25 mg  once weekly for 4 weeks then increase to 0.5 mg weekly for at least 4 weeks,max 1 mg 01/14/23   Lockie Mola, MD    Current Outpatient Medications  Medication Sig Dispense Refill   aspirin EC 81 MG EC tablet Take 1 tablet (81 mg total) by mouth daily. Swallow whole. 30 tablet 11   atorvastatin (LIPITOR) 80 MG tablet Take 1 tablet (80 mg total) by mouth daily. 90 tablet 2   lamoTRIgine (LAMICTAL) 200 MG tablet TAKE 1 TABLET BY MOUTH AT BEDTIME 90 tablet 2   nicotine polacrilex (NICORETTE) 2 MG gum Take 1 each (2 mg total) by mouth as needed for smoking cessation. 100 tablet 0   olmesartan (BENICAR) 20 MG tablet Take 1 tablet (20 mg total) by mouth at bedtime. 90 tablet 3   Polyethyl Glycol-Propyl Glycol (SYSTANE OP) Place 1 drop into both eyes in the morning.     Tiotropium Bromide-Olodaterol (STIOLTO RESPIMAT) 2.5-2.5 MCG/ACT AERS Inhale 2 puffs into the lungs daily. 1 each 0   traZODone (DESYREL) 100 MG tablet TAKE 1 TABLET BY MOUTH DAILY 90 tablet 2   Vitamin E 180 MG (400 UNIT) CAPS Take 1 capsule by mouth daily. Pt states she is unsure about strength     albuterol (VENTOLIN HFA) 108 (90 Base) MCG/ACT inhaler TAKE 2 PUFFS BY MOUTH EVERY 6 HOURS AS NEEDED FOR WHEEZE (Patient not taking: Reported on 03/29/2023) 8 g 5   diclofenac Sodium (VOLTAREN ARTHRITIS PAIN) 1 % GEL Apply 2 g topically 4 (four) times daily. (Patient not taking: Reported on 03/29/2023) 50 g 1   JARDIANCE 10 MG TABS tablet Take 1 tablet (10 mg total) by mouth daily. (Patient not taking: Reported on 03/29/2023) 30 tablet 1   nicotine (NICODERM CQ - DOSED IN MG/24 HOURS) 14 mg/24hr patch Place 1 patch (14 mg total) onto the skin daily. 28 patch 0   Semaglutide,0.25 or 0.5MG /DOS, 2 MG/1.5ML SOPN Inject 0.5 mg into the skin once a week. 0.25 mg once weekly for 4 weeks then increase to 0.5 mg weekly for at least 4 weeks,max 1 mg 1.5 mL 3   Current Facility-Administered Medications  Medication Dose Route Frequency Provider Last Rate  Last Admin   0.9 %  sodium chloride infusion  500 mL Intravenous Once Napoleon Form, MD        Allergies as of 03/29/2023 - Review Complete 03/29/2023  Allergen Reaction Noted   Iohexol Hives 06/07/2004   Diazepam Rash 10/28/2021   Sulfa antibiotics Itching and Rash 08/14/2010    Family History  Problem Relation Age of Onset   Breast cancer Mother    CVA Mother    Diabetes Sister    Kidney disease Sister    Hodgkin's lymphoma Sister    Breast cancer Maternal Aunt    Breast cancer Maternal Grandmother    Colon cancer Paternal Grandmother  Rectal cancer Neg Hx    Stomach cancer Neg Hx    Esophageal cancer Neg Hx     Social History   Socioeconomic History   Marital status: Divorced    Spouse name: Not on file   Number of children: 2   Years of education: Not on file   Highest education level: Bachelor's degree (e.g., BA, AB, BS)  Occupational History   Occupation: Part time  Tobacco Use   Smoking status: Every Day    Current packs/day: 0.50    Average packs/day: 0.5 packs/day for 36.0 years (18.0 ttl pk-yrs)    Types: Cigarettes   Smokeless tobacco: Never  Vaping Use   Vaping status: Former  Substance and Sexual Activity   Alcohol use: Yes    Comment: rarely   Drug use: No   Sexual activity: Yes    Birth control/protection: Condom    Comment: Partner with Vasectomy  Other Topics Concern   Not on file  Social History Narrative   Not on file   Social Drivers of Health   Financial Resource Strain: Low Risk  (01/13/2023)   Overall Financial Resource Strain (CARDIA)    Difficulty of Paying Living Expenses: Not hard at all  Food Insecurity: No Food Insecurity (01/13/2023)   Hunger Vital Sign    Worried About Running Out of Food in the Last Year: Never true    Ran Out of Food in the Last Year: Never true  Transportation Needs: No Transportation Needs (01/13/2023)   PRAPARE - Administrator, Civil Service (Medical): No    Lack of Transportation  (Non-Medical): No  Physical Activity: Insufficiently Active (01/13/2023)   Exercise Vital Sign    Days of Exercise per Week: 4 days    Minutes of Exercise per Session: 30 min  Stress: No Stress Concern Present (01/13/2023)   [employer] of Occupational Health - Occupational Stress Questionnaire    Feeling of Stress : Only a little  Social Connections: Moderately Integrated (01/13/2023)   Social Connection and Isolation Panel [NHANES]    Frequency of Communication with Friends and Family: More than three times a week    Frequency of Social Gatherings with Friends and Family: Once a week    Attends Religious Services: More than 4 times per year    Active Member of [employer] or Organizations: Yes    Attends Banker Meetings: 1 to 4 times per year    Marital Status: Divorced  Catering manager Violence: Not At Risk (02/15/2022)   Humiliation, Afraid, Rape, and Kick questionnaire    Fear of Current or Ex-Partner: No    Emotionally Abused: No    Physically Abused: No    Sexually Abused: No    Review of Systems:  All other review of systems negative except as mentioned in the HPI.  Physical Exam: Vital signs in last 24 hours: BP 133/78   Pulse 98   Temp 98.1 F (36.7 C)   Ht 5\' 9"  (1.753 m)   Wt 175 lb (79.4 kg)   SpO2 97%   BMI 25.84 kg/m  General:   Alert, NAD Lungs:  Clear .   Heart:  Regular rate and rhythm Abdomen:  Soft, nontender and nondistended. Neuro/Psych:  Alert and cooperative. Normal mood and affect. A and O x 3  Reviewed labs, radiology imaging, old records and pertinent past GI work up  Patient is appropriate for planned procedure(s) and anesthesia in an ambulatory setting   K. Scherry Ran ,  MD [phone]

## 2023-03-29 NOTE — Patient Instructions (Signed)

## 2023-03-29 NOTE — Op Note (Signed)
 [facility] Patient Name: [name] Procedure Date: 03/29/2023 3:21 PM MRN: [id number] Endoscopist: Napoleon Form , MD, [id number] Age: 58 Referring MD:  Date of Birth: [birth year] Gender: Female Account #: [id number] Procedure:                Colonoscopy Indications:              High risk colon cancer surveillance: Personal                            history of colonic polyps, High risk colon cancer                            surveillance: Personal history of multiple (3 or                            more) adenomas, High risk colon cancer                            surveillance: Personal history of adenoma less than                            10 mm in size Medicines:                Monitored Anesthesia Care Procedure:                Pre-Anesthesia Assessment:                           - Prior to the procedure, a History and Physical                            was performed, and patient medications and                            allergies were reviewed. The patient's tolerance of                            previous anesthesia was also reviewed. The risks                            and benefits of the procedure and the sedation                            options and risks were discussed with the patient.                            All questions were answered, and informed consent                            was obtained. Prior Anticoagulants: The patient has                            taken no anticoagulant or antiplatelet agents. ASA  Grade Assessment: III - A patient with severe                            systemic disease. After reviewing the risks and                            benefits, the patient was deemed in satisfactory                            condition to undergo the procedure.                           After obtaining informed consent, the colonoscope                            was passed under direct vision. Throughout the                             procedure, the patient's blood pressure, pulse, and                            oxygen saturations were monitored continuously. The                            Olympus Scope SN: [id number] was introduced through                            the anus and advanced to the the cecum, identified                            by appendiceal orifice and ileocecal valve. The                            colonoscopy was performed without difficulty. The                            patient tolerated the procedure well. The quality                            of the bowel preparation was good. The ileocecal                            valve, appendiceal orifice, and rectum were                            photographed. Scope In: 3:32:39 PM Scope Out: 3:49:49 PM Scope Withdrawal Time: 0 hours 10 minutes 51 seconds  Total Procedure Duration: 0 hours 17 minutes 10 seconds  Findings:                 The perianal and digital rectal examinations were                            normal.  A 4 mm polyp was found in the transverse colon. The                            polyp was sessile. The polyp was removed with a                            cold snare. Resection and retrieval were complete.                           A few small-mouthed diverticula were found in the                            sigmoid colon and descending colon.                           Non-bleeding external and internal hemorrhoids were                            found during retroflexion. The hemorrhoids were                            medium-sized. Complications:            No immediate complications. Estimated Blood Loss:     Estimated blood loss was minimal. Impression:               - One 4 mm polyp in the transverse colon, removed                            with a cold snare. Resected and retrieved.                           - Diverticulosis in the sigmoid colon and in the                             descending colon.                           - Non-bleeding external and internal hemorrhoids. Recommendation:           - Patient has a contact number available for                            emergencies. The signs and symptoms of potential                            delayed complications were discussed with the                            patient. Return to normal activities tomorrow.                            Written discharge instructions were provided to the  patient.                           - Resume previous diet.                           - Continue present medications.                           - Await pathology results.                           - Repeat colonoscopy in 5-10 years for surveillance                            based on pathology results. Napoleon Form, MD 03/29/2023 3:54:27 PM This report has been signed electronically.

## 2023-03-30 ENCOUNTER — Other Ambulatory Visit: Payer: Self-pay

## 2023-03-30 ENCOUNTER — Telehealth: Payer: Self-pay | Admitting: Neurology

## 2023-03-30 ENCOUNTER — Emergency Department (HOSPITAL_BASED_OUTPATIENT_CLINIC_OR_DEPARTMENT_OTHER)

## 2023-03-30 ENCOUNTER — Encounter (HOSPITAL_BASED_OUTPATIENT_CLINIC_OR_DEPARTMENT_OTHER): Payer: Self-pay | Admitting: Emergency Medicine

## 2023-03-30 ENCOUNTER — Telehealth: Payer: Self-pay | Admitting: *Deleted

## 2023-03-30 ENCOUNTER — Emergency Department (HOSPITAL_BASED_OUTPATIENT_CLINIC_OR_DEPARTMENT_OTHER)
Admission: EM | Admit: 2023-03-30 | Discharge: 2023-03-30 | Discharge status: Against Medical Advice | Attending: Emergency Medicine | Admitting: Emergency Medicine

## 2023-03-30 DIAGNOSIS — R519 Headache, unspecified: Secondary | ICD-10-CM | POA: Insufficient documentation

## 2023-03-30 DIAGNOSIS — R251 Tremor, unspecified: Secondary | ICD-10-CM | POA: Diagnosis not present

## 2023-03-30 DIAGNOSIS — Z8673 Personal history of transient ischemic attack (TIA), and cerebral infarction without residual deficits: Secondary | ICD-10-CM | POA: Diagnosis not present

## 2023-03-30 DIAGNOSIS — Z5321 Procedure and treatment not carried out due to patient leaving prior to being seen by health care provider: Secondary | ICD-10-CM | POA: Insufficient documentation

## 2023-03-30 LAB — CBC WITH DIFFERENTIAL/PLATELET
Abs Immature Granulocytes: 0.02 10*3/uL (ref 0.00–0.07)
Basophils Absolute: 0 10*3/uL (ref 0.0–0.1)
Basophils Relative: 1 %
Eosinophils Absolute: 0.2 10*3/uL (ref 0.0–0.5)
Eosinophils Relative: 3 %
HCT: 40 % (ref 36.0–46.0)
Hemoglobin: 12.8 g/dL (ref 12.0–15.0)
Immature Granulocytes: 0 %
Lymphocytes Relative: 39 %
Lymphs Abs: 2.9 10*3/uL (ref 0.7–4.0)
MCH: 27.1 pg (ref 26.0–34.0)
MCHC: 32 g/dL (ref 30.0–36.0)
MCV: 84.7 fL (ref 80.0–100.0)
Monocytes Absolute: 0.7 10*3/uL (ref 0.1–1.0)
Monocytes Relative: 9 %
Neutro Abs: 3.7 10*3/uL (ref 1.7–7.7)
Neutrophils Relative %: 48 %
Platelets: 303 10*3/uL (ref 150–400)
RBC: 4.72 MIL/uL (ref 3.87–5.11)
RDW: 14.7 % (ref 11.5–15.5)
WBC: 7.5 10*3/uL (ref 4.0–10.5)
nRBC: 0 % (ref 0.0–0.2)

## 2023-03-30 LAB — BASIC METABOLIC PANEL
Anion gap: 11 (ref 5–15)
BUN: 9 mg/dL (ref 6–20)
CO2: 22 mmol/L (ref 22–32)
Calcium: 9 mg/dL (ref 8.9–10.3)
Chloride: 106 mmol/L (ref 98–111)
Creatinine, Ser: 1.19 mg/dL — ABNORMAL HIGH (ref 0.44–1.00)
GFR, Estimated: 53 mL/min — ABNORMAL LOW (ref >60)
Glucose, Bld: 101 mg/dL — ABNORMAL HIGH (ref 70–99)
Potassium: 3.8 mmol/L (ref 3.5–5.1)
Sodium: 139 mmol/L (ref 135–145)

## 2023-03-30 LAB — GLUCOSE, CAPILLARY: Glucose-Capillary: 96 mg/dL (ref 70–99)

## 2023-03-30 NOTE — ED Notes (Signed)
 Registration announced pt has left
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
## 2023-03-30 NOTE — Telephone Encounter (Signed)
  Follow up Call-     03/29/2023    2:40 PM 03/11/2023   10:45 AM 10/28/2021    9:49 AM  Call back number  Post procedure Call Back phone  # [phone]  Permission to leave phone message Yes Yes      Patient questions:  Do you have a fever, pain , or abdominal swelling? No. Pain Score  0 *  Have you tolerated food without any problems? Yes.    Have you been able to return to your normal activities? Yes.    Do you have any questions about your discharge instructions: Diet   No. Medications  No. Follow up visit  No.  Do you have questions or concerns about your Care? No.  Actions: * If pain score is 4 or above: No action needed, pain <4.
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
## 2023-04-01 LAB — SURGICAL PATHOLOGY

## 2023-04-10 ENCOUNTER — Other Ambulatory Visit: Payer: Self-pay | Admitting: Family Medicine

## 2023-04-10 DIAGNOSIS — I10 Essential (primary) hypertension: Secondary | ICD-10-CM

## 2023-04-17 ENCOUNTER — Other Ambulatory Visit: Payer: Self-pay | Admitting: Family Medicine

## 2023-04-19 ENCOUNTER — Other Ambulatory Visit: Payer: Self-pay | Admitting: Family Medicine

## 2023-04-19 DIAGNOSIS — I10 Essential (primary) hypertension: Secondary | ICD-10-CM

## 2023-05-09 ENCOUNTER — Other Ambulatory Visit: Payer: Self-pay | Admitting: Family Medicine

## 2023-05-23 ENCOUNTER — Encounter: Payer: Self-pay | Admitting: Gastroenterology

## 2023-06-09 ENCOUNTER — Ambulatory Visit

## 2023-06-15 DIAGNOSIS — D631 Anemia in chronic kidney disease: Secondary | ICD-10-CM | POA: Diagnosis not present

## 2023-06-15 DIAGNOSIS — N1831 Chronic kidney disease, stage 3a: Secondary | ICD-10-CM | POA: Diagnosis not present

## 2023-06-15 DIAGNOSIS — N2581 Secondary hyperparathyroidism of renal origin: Secondary | ICD-10-CM | POA: Diagnosis not present

## 2023-06-15 DIAGNOSIS — I129 Hypertensive chronic kidney disease with stage 1 through stage 4 chronic kidney disease, or unspecified chronic kidney disease: Secondary | ICD-10-CM | POA: Diagnosis not present

## 2023-06-16 ENCOUNTER — Ambulatory Visit

## 2023-06-16 VITALS — Ht 69.0 in | Wt 166.0 lb

## 2023-06-16 DIAGNOSIS — Z0000 Encounter for general adult medical examination without abnormal findings: Secondary | ICD-10-CM | POA: Diagnosis not present

## 2023-06-16 NOTE — Progress Notes (Signed)
 Subjective:   [name] is a 58 y.o. who presents for a Medicare Wellness preventive visit.  As a reminder, Annual Wellness Visits don't include a physical exam, and some assessments may be limited, especially if this visit is performed virtually. We may recommend an in-person follow-up visit with your provider if needed.  Visit Complete: Virtual I connected with  [name] on 06/16/23 by a audio enabled telemedicine application and verified that I am speaking with the correct person using two identifiers.  Patient Location: Home  Provider Location: Office/Clinic  I discussed the limitations of evaluation and management by telemedicine. The patient expressed understanding and agreed to proceed.  Vital Signs: Because this visit was a virtual/telehealth visit, some criteria may be missing or patient reported. Any vitals not documented were not able to be obtained and vitals that have been documented are patient reported.  VideoDeclined- This patient declined Librarian, academic. Therefore the visit was completed with audio only.  Persons Participating in Visit: Patient.  AWV Questionnaire: No: Patient Medicare AWV questionnaire was not completed prior to this visit.  Cardiac Risk Factors include: advanced age (>23men, >92 women);dyslipidemia;family history of premature cardiovascular disease;hypertension;smoking/ tobacco exposure     Objective:     Today's Vitals   06/16/23 1534  Weight: 166 lb (75.3 kg)  Height: 5\' 9"  (1.753 m)  PainSc: 0-No pain   Body mass index is 24.51 kg/m.     06/16/2023    3:35 PM 03/30/2023    5:23 PM 03/14/2023    9:56 AM 04/30/2022    4:09 PM 02/15/2022    3:54 PM 11/27/2021    3:27 PM 05/27/2021    4:41 PM  Advanced Directives  Does Patient Have a Medical Advance Directive? No No No No No No No  Would patient like information on creating a medical advance directive? No - Patient declined  No - Patient  declined No - Patient declined No - Patient declined No - Patient declined No - Patient declined    Current Medications (verified) Outpatient Encounter Medications as of 06/16/2023  Medication Sig   albuterol  (VENTOLIN  HFA) 108 (90 Base) MCG/ACT inhaler TAKE 2 PUFFS BY MOUTH EVERY 6 HOURS AS NEEDED FOR WHEEZE (Patient not taking: Reported on 03/29/2023)   aspirin  EC 81 MG EC tablet Take 1 tablet (81 mg total) by mouth daily. Swallow whole.   atorvastatin  (LIPITOR ) 80 MG tablet Take 1 tablet (80 mg total) by mouth daily.   diclofenac  Sodium (VOLTAREN  ARTHRITIS PAIN) 1 % GEL Apply 2 g topically 4 (four) times daily. (Patient not taking: Reported on 03/29/2023)   JARDIANCE  10 MG TABS tablet TAKE 1 TABLET(10 MG) BY MOUTH DAILY   lamoTRIgine  (LAMICTAL ) 200 MG tablet TAKE 1 TABLET BY MOUTH AT BEDTIME   nicotine  (NICODERM CQ  - DOSED IN MG/24 HOURS) 14 mg/24hr patch Place 1 patch (14 mg total) onto the skin daily.   nicotine  polacrilex (NICORETTE ) 2 MG gum Take 1 each (2 mg total) by mouth as needed for smoking cessation.   olmesartan  (BENICAR ) 20 MG tablet Take 1 tablet (20 mg total) by mouth at bedtime.   Polyethyl Glycol-Propyl Glycol (SYSTANE OP) Place 1 drop into both eyes in the morning.   Semaglutide ,0.25 or 0.5MG /DOS, 2 MG/1.5ML SOPN Inject 0.5 mg into the skin once a week. 0.25 mg once weekly for 4 weeks then increase to 0.5 mg weekly for at least 4 weeks,max 1 mg   Tiotropium Bromide-Olodaterol (STIOLTO RESPIMAT ) 2.5-2.5 MCG/ACT  AERS INHALE 2 PUFFS INTO THE LUNGS DAILY   traZODone  (DESYREL ) 100 MG tablet TAKE 1 TABLET BY MOUTH DAILY   Vitamin E 180 MG (400 UNIT) CAPS Take 1 capsule by mouth daily. Pt states she is unsure about strength   No facility-administered encounter medications on file as of 06/16/2023.    Allergies (verified) Iohexol , Diazepam, and Sulfa antibiotics   History: Past Medical History:  Diagnosis Date   Arthritis    Bipolar 1 disorder (HCC)    Carotid artery occlusion     Chronic kidney disease    donated left kidney   Diabetes mellitus without complication (HCC)    Heart murmur    Hyperlipidemia    Hypertension    Multiple sclerosis (HCC)    Neuromuscular disorder (HCC)    Pneumonia 2010   had 2 episodes/ was sent home on O2   Prediabetes    Pulmonary embolism (HCC) 1994   Only 1 clot, not on lifelong anticoagulation   Stroke [facility])    TIA   Past Surgical History:  Procedure Laterality Date   ANTERIOR CERVICAL DECOMP/DISCECTOMY FUSION N/A 02/25/2021   Procedure: Anterior Cervical Discectomy Fusion - Cervical Three-Cervical Four;  Surgeon: Isadora Mar, MD;  Location: [facility] OR;  Service: Neurosurgery;  Laterality: N/A;  Anterior Cervical Discectomy Fusion - Cervical Three-Cervical Four   APPENDECTOMY  2001   ENDARTERECTOMY Left 08/20/2020   Procedure: LEFT CAROTID ARTERY ENDARTERECTOMY;  Surgeon: Carlene Che, MD;  Location: [facility] OR;  Service: Vascular;  Laterality: Left;   Endometriosis s/p left fallopian tube removal.  2001   NEPHRECTOMY LIVING DONOR  1993   Donated kidney to sister   PATCH ANGIOPLASTY Left 08/20/2020   Procedure: PATCH ANGIOPLASTY WITH 1x6 Hazeline Lister;  Surgeon: Carlene Che, MD;  Location: [facility] OR;  Service: Vascular;  Laterality: Left;   TONSILLECTOMY     Family History  Problem Relation Age of Onset   Breast cancer Mother    CVA Mother    Diabetes Sister    Kidney disease Sister    Hodgkin's lymphoma Sister    Breast cancer Maternal Aunt    Breast cancer Maternal Grandmother    Colon cancer Paternal Grandmother    Rectal cancer Neg Hx    Stomach cancer Neg Hx    Esophageal cancer Neg Hx    Social History   Socioeconomic History   Marital status: Divorced    Spouse name: Not on file   Number of children: 2   Years of education: Not on file   Highest education level: Bachelor's degree (e.g., BA, AB, BS)  Occupational History   Occupation: Part time  Tobacco Use   Smoking status: Every Day    Current  packs/day: 0.50    Average packs/day: 0.5 packs/day for 36.0 years (18.0 ttl pk-yrs)    Types: Cigarettes   Smokeless tobacco: Never  Vaping Use   Vaping status: Former  Substance and Sexual Activity   Alcohol  use: Yes    Comment: rarely   Drug use: No   Sexual activity: Yes    Birth control/protection: Condom    Comment: Partner with Vasectomy  Other Topics Concern   Not on file  Social History Narrative   Not on file   Social Drivers of Health   Financial Resource Strain: Low Risk  (06/16/2023)   Overall Financial Resource Strain (CARDIA)    Difficulty of Paying Living Expenses: Not hard at all  Food Insecurity: No Food Insecurity (06/16/2023)  Hunger Vital Sign    Worried About Running Out of Food in the Last Year: Never true    Ran Out of Food in the Last Year: Never true  Transportation Needs: No Transportation Needs (06/16/2023)   PRAPARE - Administrator, Civil Service (Medical): No    Lack of Transportation (Non-Medical): No  Physical Activity: Sufficiently Active (06/16/2023)   Exercise Vital Sign    Days of Exercise per Week: 5 days    Minutes of Exercise per Session: 30 min  Stress: No Stress Concern Present (06/16/2023)   [employer] of Occupational Health - Occupational Stress Questionnaire    Feeling of Stress : Only a little  Social Connections: Moderately Integrated (06/16/2023)   Social Connection and Isolation Panel [NHANES]    Frequency of Communication with Friends and Family: More than three times a week    Frequency of Social Gatherings with Friends and Family: Once a week    Attends Religious Services: More than 4 times per year    Active Member of [employer] or Organizations: Yes    Attends Banker Meetings: 1 to 4 times per year    Marital Status: Divorced    Tobacco Counseling Ready to quit: Not Answered Counseling given: Not Answered    Clinical Intake:  Pre-visit preparation completed: Yes  Pain : No/denies pain Pain  Score: 0-No pain     BMI - recorded: 24.51 Nutritional Status: BMI of 19-24  Normal Nutritional Risks: None Diabetes: No  Lab Results  Component Value Date   HGBA1C 5.7 03/14/2023   HGBA1C 5.8 05/31/2022   HGBA1C 6.2 (H) 11/19/2021     How often do you need to have someone help you when you read instructions, pamphlets, or other written materials from your doctor or pharmacy?: 1 - Never What is the last grade level you completed in school?: Bachelor's Degree  Interpreter Needed?: No  Information entered by :: Charbel Los N. Leandria Thier, LPN.   Activities of Daily Living     06/16/2023    3:37 PM  In your present state of health, do you have any difficulty performing the following activities:  Hearing? 0  Vision? 0  Difficulty concentrating or making decisions? 0  Walking or climbing stairs? 0  Dressing or bathing? 0  Doing errands, shopping? 0  Preparing Food and eating ? N  Using the Toilet? N  In the past six months, have you accidently leaked urine? N  Comment no protection, only happens when you wake x 2-3 months  Do you have problems with loss of bowel control? N  Managing your Medications? N  Managing your Finances? N  Housekeeping or managing your Housekeeping? N    Patient Care Team: Santa Cuba, MD as PCP - General (Family Medicine) Sheryle Donning, MD as PCP - Cardiology (Cardiology) [place], [facility] as Consulting Physician (Nephrology) [facility] as Consulting Physician (Ophthalmology) Wess Hammed, NP as Nurse Practitioner (Neurology)  I have updated your Care Teams any recent Medical Services you may have received from other providers in the past year.     Assessment:    This is a routine wellness examination for [name].  Hearing/Vision screen Hearing Screening - Comments:: Denies hearing difficulties.  Vision Screening - Comments:: Wears rx glasses - up to date with routine eye exams with [facility]    Goals Addressed             This Visit's Progress  06/16/2023: To quit smoking and lose 15 pounds.         Depression Screen     06/16/2023    3:38 PM 03/14/2023   10:00 AM 01/14/2023   11:06 AM 05/31/2022    4:17 PM 04/30/2022    4:08 PM 02/15/2022    3:37 PM 03/18/2021    3:32 PM  PHQ 2/9 Scores  PHQ - 2 Score 0 0  0 0 0 0  PHQ- 9 Score 0 0  1 0  0  Exception Documentation   Patient refusal        Fall Risk     06/16/2023    3:35 PM 05/31/2022    4:17 PM 04/30/2022    4:08 PM 02/15/2022    3:38 PM 03/18/2021    3:32 PM  Fall Risk   Falls in the past year? 0 0 0 0 0  Number falls in past yr: 0 0 0 0 0  Injury with Fall? 0 0 0 0 0  Risk for fall due to : No Fall Risks   No Fall Risks   Follow up Falls evaluation completed        MEDICARE RISK AT HOME:  Medicare Risk at Home Any stairs in or around the home?: No If so, are there any without handrails?: No Home free of loose throw rugs in walkways, pet beds, electrical cords, etc?: Yes Adequate lighting in your home to reduce risk of falls?: Yes Life alert?: No Use of a cane, walker or w/c?: No Grab bars in the bathroom?: Yes Shower chair or bench in shower?: Yes Elevated toilet seat or a handicapped toilet?: No  TIMED UP AND GO:  Was the test performed?  No  Cognitive Function: Declined/Normal: No cognitive concerns noted by patient or family. Patient alert, oriented, able to answer questions appropriately and recall recent events. No signs of memory loss or confusion.    06/16/2023    3:38 PM  MMSE - Mini Mental State Exam  Not completed: Unable to complete      11/19/2021    9:00 AM  Montreal Cognitive Assessment   Visuospatial/ Executive (0/5) 3  Naming (0/3) 3  Attention: Read list of digits (0/2) 2  Attention: Read list of letters (0/1) 1  Attention: Serial 7 subtraction starting at 100 (0/3) 2  Language: Repeat phrase (0/2) 2  Language : Fluency (0/1) 1  Abstraction (0/2) 2  Delayed Recall (0/5) 3   Orientation (0/6) 6  Total 25      06/16/2023    3:35 PM 02/15/2022    3:39 PM  6CIT Screen  What Year? 0 points 0 points  What month? 0 points 0 points  What time? 0 points 0 points  Count back from 20 0 points 0 points  Months in reverse 0 points 0 points  Repeat phrase 0 points 2 points  Total Score 0 points 2 points    Immunizations Immunization History  Administered Date(s) Administered   Influenza Split 10/07/2011, 11/10/2012   Influenza,inj,Quad PF,6+ Mos 09/28/2013, 10/17/2014, 11/25/2015, 11/26/2016, 10/13/2017, 10/12/2018, 10/17/2021   Influenza,inj,quad, With Preservative 10/11/2016   Influenza-Unspecified 10/12/2018, 08/12/2019, 10/10/2020, 09/18/2022   PFIZER Comirnaty(Gray Top)Covid-19 Tri-Sucrose Vaccine 10/31/2020, 10/17/2021   PFIZER(Purple Top)SARS-COV-2 Vaccination 03/17/2019, 04/07/2019, 11/07/2019, 05/10/2020   PNEUMOCOCCAL CONJUGATE-20 11/26/2020   Pneumococcal Polysaccharide-23 05/26/2006, 10/13/2017   Unspecified SARS-COV-2 Vaccination 09/18/2022   Zoster Recombinant(Shingrix ) 03/22/2021   Zoster, Unspecified 09/18/2022    Screening Tests Health Maintenance  Topic Date Due  DTaP/Tdap/Td (1 - Tdap) Never done   COVID-19 Vaccine (8 - Pfizer risk 2024-25 season) 03/18/2023   INFLUENZA VACCINE  08/12/2023   Medicare Annual Wellness (AWV)  06/15/2024   MAMMOGRAM  09/26/2024   Cervical Cancer Screening (HPV/Pap Cotest)  04/30/2027   Colonoscopy  03/28/2033   Pneumococcal Vaccine 55-15 Years old  Completed   Hepatitis C Screening  Completed   HIV Screening  Completed   Zoster Vaccines- Shingrix   Completed   HPV VACCINES  Aged Out   Meningococcal B Vaccine  Aged Out    Health Maintenance  Health Maintenance Due  Topic Date Due   DTaP/Tdap/Td (1 - Tdap) Never done   COVID-19 Vaccine (8 - Pfizer risk 2024-25 season) 03/18/2023   Health Maintenance Items Addressed: Yes Patient aware of current care gaps.  Immunization record was verified by [facility]  and updated in patient's chart.  Additional Screening:  Vision Screening: Recommended annual ophthalmology exams for early detection of glaucoma and other disorders of the eye. Would you like a referral to an eye doctor? No    Dental Screening: Recommended annual dental exams for proper oral hygiene  Community Resource Referral / Chronic Care Management: CRR required this visit?  No   CCM required this visit?  No   Plan:    I have personally reviewed and noted the following in the patient's chart:   Medical and social history Use of alcohol , tobacco or illicit drugs  Current medications and supplements including opioid prescriptions. Patient is not currently taking opioid prescriptions. Functional ability and status Nutritional status Physical activity Advanced directives List of other physicians Hospitalizations, surgeries, and ER visits in previous 12 months Vitals Screenings to include cognitive, depression, and falls Referrals and appointments  In addition, I have reviewed and discussed with patient certain preventive protocols, quality metrics, and best practice recommendations. A written personalized care plan for preventive services as well as general preventive health recommendations were provided to patient.   Margette Sheldon, LPN   4/0/9811   After Visit Summary: (MyChart) Due to this being a telephonic visit, the after visit summary with patients personalized plan was offered to patient via MyChart   Notes:Patient aware of current care gaps.  Immunization record was verified by [facility] and updated in patient's chart. Patient is due Dtap and Covid Vaccines.

## 2023-06-16 NOTE — Patient Instructions (Signed)
 [name] , Thank you for taking time out of your busy schedule to complete your Annual Wellness Visit with me. I enjoyed our conversation and look forward to speaking with you again next year. I, as well as your care team,  appreciate your ongoing commitment to your health goals. Please review the following plan we discussed and let me know if I can assist you in the future. Your Game plan/ To Do List    Referrals: If you haven't heard from the office you've been referred to, please reach out to them at the phone provided.   Follow up Visits: Next Medicare AWV with our clinical staff: 06/18/2024 at 3:40 pm Phone Visit with Nurse Health Advisor   Have you seen your provider in the last 6 months (3 months if uncontrolled diabetes)? Yes Next Office Visit with your provider: Patient will call to schedule  Clinician Recommendations:  Aim for 30 minutes of exercise or brisk walking, 6-8 glasses of water , and 5 servings of fruits and vegetables each day.       This is a list of the screening recommended for you and due dates:  Health Maintenance  Topic Date Due   DTaP/Tdap/Td vaccine (1 - Tdap) Never done   COVID-19 Vaccine (8 - Pfizer risk 2024-25 season) 03/18/2023   Flu Shot  08/12/2023   Medicare Annual Wellness Visit  01/14/2024   Mammogram  09/26/2024   Pap with HPV screening  04/30/2027   Colon Cancer Screening  03/28/2033   Pneumococcal Vaccination  Completed   Hepatitis C Screening  Completed   HIV Screening  Completed   Zoster (Shingles) Vaccine  Completed   HPV Vaccine  Aged Out   Meningitis B Vaccine  Aged Out    Advanced directives: (Declined) Advance directive discussed with you today. Even though you declined this today, please call our office should you change your mind, and we can give you the proper paperwork for you to fill out. Advance Care Planning is important because it:  [x]  Makes sure you receive the medical care that is consistent with your values, goals, and  preferences  [x]  It provides guidance to your family and loved ones and reduces their decisional burden about whether or not they are making the right decisions based on your wishes.  Follow the link provided in your after visit summary or read over the paperwork we have mailed to you to help you started getting your Advance Directives in place. If you need assistance in completing these, please reach out to us  so that we can help you!  See attachments for Preventive Care and Fall Prevention Tips.
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
## 2023-06-23 ENCOUNTER — Encounter: Payer: Self-pay | Admitting: Neurology

## 2023-06-23 ENCOUNTER — Ambulatory Visit (INDEPENDENT_AMBULATORY_CARE_PROVIDER_SITE_OTHER): Payer: Medicare PPO | Admitting: Neurology

## 2023-06-23 VITALS — BP 136/76 | HR 90 | Ht 69.0 in | Wt 168.2 lb

## 2023-06-23 DIAGNOSIS — I6522 Occlusion and stenosis of left carotid artery: Secondary | ICD-10-CM | POA: Diagnosis not present

## 2023-06-23 DIAGNOSIS — G35 Multiple sclerosis: Secondary | ICD-10-CM

## 2023-06-23 DIAGNOSIS — I639 Cerebral infarction, unspecified: Secondary | ICD-10-CM | POA: Diagnosis not present

## 2023-06-23 NOTE — Patient Instructions (Signed)
 Great to see you today.  We will continue Ocrevus .  Check routine labs today.  Please follow-up with vascular.  I can offer home sleep study, please let me know if you would like to proceed.  Follow-up in 6 months.  Thanks!!

## 2023-06-24 ENCOUNTER — Other Ambulatory Visit: Payer: Self-pay | Admitting: Family Medicine

## 2023-06-24 NOTE — Telephone Encounter (Signed)
 Will refill Stiolto for now.  Admin team: can we get her scheduled for a follow up visit for her breathing?  Thanks!

## 2023-06-27 ENCOUNTER — Other Ambulatory Visit

## 2023-06-27 ENCOUNTER — Other Ambulatory Visit: Payer: Self-pay

## 2023-06-27 NOTE — Addendum Note (Signed)
 Addended by: Randi Buster on: 06/27/2023 04:02 PM   Modules accepted: Orders

## 2023-06-28 ENCOUNTER — Ambulatory Visit (INDEPENDENT_AMBULATORY_CARE_PROVIDER_SITE_OTHER): Admitting: Family Medicine

## 2023-06-28 VITALS — BP 142/76 | HR 103 | Ht 69.0 in | Wt 169.4 lb

## 2023-06-28 DIAGNOSIS — J449 Chronic obstructive pulmonary disease, unspecified: Secondary | ICD-10-CM | POA: Diagnosis not present

## 2023-06-28 DIAGNOSIS — G35 Multiple sclerosis: Secondary | ICD-10-CM

## 2023-06-28 DIAGNOSIS — R7303 Prediabetes: Secondary | ICD-10-CM

## 2023-06-28 DIAGNOSIS — I10 Essential (primary) hypertension: Secondary | ICD-10-CM | POA: Diagnosis not present

## 2023-06-28 MED ORDER — SEMAGLUTIDE (1 MG/DOSE) 4 MG/3ML ~~LOC~~ SOPN: 1.0000 mg | PEN_INJECTOR | SUBCUTANEOUS | 0 refills | Status: DC

## 2023-06-28 MED ORDER — OLMESARTAN MEDOXOMIL 40 MG PO TABS: 40.0000 mg | ORAL_TABLET | Freq: Every day | ORAL | 3 refills | Status: AC

## 2023-06-28 NOTE — Patient Instructions (Addendum)
 It was wonderful to see you today.  Please bring ALL of your medications with you to every visit.   Today we talked about:  COPD - Continue the new inhaler. Work on decreasing the number of cigarettes per day. Try to work on Medical illustrator (look at cute pictures of Wilda Handsome!)   For your blood pressure - We will try to increase the dose of just your olmesartan  from 20 to 40 and see if that will help without making it low.   Please follow up in 2 weeks for a blood pressure check   Thank you for choosing [facility].   Please call [phone] with any questions about today's appointment.  Please be sure to schedule follow up at the front desk before you leave today.   Kimberly Cuba, MD  Family Medicine

## 2023-06-28 NOTE — Assessment & Plan Note (Signed)
 Now hypertensive after discontinuing the amlodipine  5 mg from the combo pill. Would benefit from improved BP control. Patient does not want to take 2 pills for blood pressure.  - Increased olmesartan  from 20 to 40 mg  - Follow up in 2 weeks for BP and BMP.

## 2023-06-28 NOTE — Assessment & Plan Note (Signed)
 Now controlled on stiolto; however, increased cigarette use is counteractive. Patient counseled on cigarette use. Discussed barriers to cessation.  - Continue nicotine  replacement  - Discussed coping strategies for stress.  - Follow up in 2 weeks

## 2023-06-28 NOTE — Assessment & Plan Note (Signed)
 Weightloss has plateaued and patient is still prediabetic. Thus will increase ozempic  to 1 mg.  - Follow up for side effects.

## 2023-06-28 NOTE — Progress Notes (Signed)
    SUBJECTIVE:   CHIEF COMPLAINT / HPI:   COPD Patient states she believes stiolto is helping with her wheezing. She reports that she has increased the number of cigarettes she has been smoking recently due to stressors related to the father of her daughter's child. She is now at 0.5ppd. States the nicotine  patch was helping when she puts it on especially when she is around her grandchild, but sometimes will not be motivated to put it on.   Multiple Sclerosis  Patient has not had any episodes recently. Recently followed up with neurology but was unable to get labs drawn is requesting those labs to be drawn here.   Prediabetes  Patient denies any side effects from ozempic . She feels that she has plateaued with her weight loss and wants to increase the dose. She has been following diet recommendations and walking for exercise as well as playing with her grandson.   Hypertension  Has not had any side effects since going from olmesartan  20 mg - 5 amlodipine  to olmesartan  20 mg. No headaches or lightheadedness.   PERTINENT  PMH / PSH: H/o TIA, MS, COPD   OBJECTIVE:   BP (!) 142/76   Pulse (!) 103   Ht 5' 9 (1.753 m)   Wt 169 lb 6.4 oz (76.8 kg)   SpO2 99%   BMI 25.02 kg/m   General: well appearing, in no acute distress CV: RRR, radial pulses equal and palpable, no BLE edema  Resp: Normal work of breathing on room air, faint wheezing on expiration     ASSESSMENT/PLAN:   Chronic obstructive pulmonary disease, unspecified COPD type (HCC) Assessment & Plan: Now controlled on stiolto; however, increased cigarette use is counteractive. Patient counseled on cigarette use. Discussed barriers to cessation.  - Continue nicotine  replacement  - Discussed coping strategies for stress.  - Follow up in 2 weeks    Prediabetes Assessment & Plan: Weightloss has plateaued and patient is still prediabetic. Thus will increase ozempic  to 1 mg.  - Follow up for side effects.   Orders: -      Semaglutide  (1 MG/DOSE); Inject 1 mg into the skin once a week.  Dispense: 3 mL; Refill: 0  Relapsing remitting multiple sclerosis (HCC) -     CBC with Differential/Platelet -     IgG, IgA, IgM  Primary hypertension Assessment & Plan: Now hypertensive after discontinuing the amlodipine  5 mg from the combo pill. Would benefit from improved BP control. Patient does not want to take 2 pills for blood pressure.  - Increased olmesartan  from 20 to 40 mg  - Follow up in 2 weeks for BP and BMP.   Orders: -     Olmesartan  Medoxomil; Take 1 tablet (40 mg total) by mouth at bedtime.  Dispense: 90 tablet; Refill: 3      Santa Cuba, MD [facility]

## 2023-06-29 LAB — CBC WITH DIFFERENTIAL/PLATELET
Basophils Absolute: 0 10*3/uL (ref 0.0–0.2)
Basos: 1 %
EOS (ABSOLUTE): 0.2 10*3/uL (ref 0.0–0.4)
Eos: 3 %
Hematocrit: 42.2 % (ref 34.0–46.6)
Hemoglobin: 13.7 g/dL (ref 11.1–15.9)
Immature Grans (Abs): 0 10*3/uL (ref 0.0–0.1)
Immature Granulocytes: 0 %
Lymphocytes Absolute: 3 10*3/uL (ref 0.7–3.1)
Lymphs: 39 %
MCH: 27.5 pg (ref 26.6–33.0)
MCHC: 32.5 g/dL (ref 31.5–35.7)
MCV: 85 fL (ref 79–97)
Monocytes Absolute: 0.5 10*3/uL (ref 0.1–0.9)
Monocytes: 7 %
Neutrophils Absolute: 3.9 10*3/uL (ref 1.4–7.0)
Neutrophils: 50 %
Platelets: 290 10*3/uL (ref 150–450)
RBC: 4.98 x10E6/uL (ref 3.77–5.28)
RDW: 14 % (ref 11.7–15.4)
WBC: 7.7 10*3/uL (ref 3.4–10.8)

## 2023-06-29 LAB — IGG, IGA, IGM
IgA/Immunoglobulin A, Serum: 380 mg/dL — ABNORMAL HIGH (ref 87–352)
IgG (Immunoglobin G), Serum: 979 mg/dL (ref 586–1602)
IgM (Immunoglobulin M), Srm: 141 mg/dL (ref 26–217)

## 2023-07-04 ENCOUNTER — Ambulatory Visit: Payer: Self-pay | Admitting: Neurology

## 2023-07-11 DIAGNOSIS — S0502XA Injury of conjunctiva and corneal abrasion without foreign body, left eye, initial encounter: Secondary | ICD-10-CM | POA: Diagnosis not present

## 2023-07-11 DIAGNOSIS — H16302 Unspecified interstitial keratitis, left eye: Secondary | ICD-10-CM | POA: Diagnosis not present

## 2023-07-14 DIAGNOSIS — S0502XD Injury of conjunctiva and corneal abrasion without foreign body, left eye, subsequent encounter: Secondary | ICD-10-CM | POA: Diagnosis not present

## 2023-07-14 DIAGNOSIS — H16302 Unspecified interstitial keratitis, left eye: Secondary | ICD-10-CM | POA: Diagnosis not present

## 2023-07-19 ENCOUNTER — Other Ambulatory Visit: Payer: Self-pay | Admitting: Family Medicine

## 2023-07-19 DIAGNOSIS — R7303 Prediabetes: Secondary | ICD-10-CM

## 2023-08-03 DIAGNOSIS — M25551 Pain in right hip: Secondary | ICD-10-CM | POA: Diagnosis not present

## 2023-08-08 ENCOUNTER — Other Ambulatory Visit: Payer: Self-pay | Admitting: Family Medicine

## 2023-08-15 ENCOUNTER — Other Ambulatory Visit: Payer: Self-pay | Admitting: *Deleted

## 2023-08-15 ENCOUNTER — Other Ambulatory Visit: Payer: Self-pay | Admitting: Family Medicine

## 2023-08-15 ENCOUNTER — Encounter (INDEPENDENT_AMBULATORY_CARE_PROVIDER_SITE_OTHER): Payer: Self-pay | Admitting: Neurology

## 2023-08-15 DIAGNOSIS — I6523 Occlusion and stenosis of bilateral carotid arteries: Secondary | ICD-10-CM

## 2023-08-15 DIAGNOSIS — G35 Multiple sclerosis: Secondary | ICD-10-CM | POA: Diagnosis not present

## 2023-08-15 DIAGNOSIS — R7303 Prediabetes: Secondary | ICD-10-CM

## 2023-08-15 MED ORDER — MIRABEGRON ER 25 MG PO TB24: 25.0000 mg | ORAL_TABLET | Freq: Every day | ORAL | 5 refills | Status: AC

## 2023-08-15 NOTE — Telephone Encounter (Signed)
 Please see the MyChart message reply(ies) for my assessment and plan.    This patient gave consent for this Medical Advice Message and is aware that it may result in a bill to [employer], as well as the possibility of receiving a bill for a co-payment or deductible. They are an established patient, but are not seeking medical advice exclusively about a problem treated during an in person or video visit in the last seven days. I did not recommend an in person or video visit within seven days of my reply.    I spent a total of 6 minutes cumulative time within 7 days through [employer].  Lauraine JINNY Born, NP   Meds ordered this encounter  Medications   mirabegron  ER (MYRBETRIQ ) 25 MG TB24 tablet    Sig: Take 1 tablet (25 mg total) by mouth daily.    Dispense:  30 tablet    Refill:  5

## 2023-08-25 DIAGNOSIS — G35 Multiple sclerosis: Secondary | ICD-10-CM | POA: Diagnosis not present

## 2023-08-28 ENCOUNTER — Other Ambulatory Visit: Payer: Self-pay | Admitting: Family Medicine

## 2023-09-05 ENCOUNTER — Other Ambulatory Visit: Payer: Self-pay | Admitting: Family Medicine

## 2023-09-05 DIAGNOSIS — R7303 Prediabetes: Secondary | ICD-10-CM

## 2023-09-12 NOTE — Progress Notes (Unsigned)
 [facility] [place] PROGRESS NOTE  ASSESSMENT / PLAN: [name] is a 58 y.o. female status post left carotid endarterectomy 08/21/20 for symptomatic left carotid artery stenosis.  Patient has done extremely well postoperatively.  Counseled her to continue aspirin , statin therapies indefinitely.  Her immediate postoperative carotid duplex showed no evidence of stenosis.  Duplex today shows stenosis in the 60 to 79% range.  Thankfully she is asymptomatic.  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  Will rescan her in 1 to 2 months.  SUBJECTIVE: Patient returns to clinic for postop evaluation.  As usual, she has maintained a excellent sense of humor.  We reviewed her test results, and the abnormal reaccumulation of stenosis seen today on duplex.  She has been struggling with a new diagnosis of multiple sclerosis.  OBJECTIVE: There were no vitals taken for this visit.  No acute distress Left neck well-healed Regular rate and rhythm Unlabored breathing     Latest Ref Rng & Units 06/28/2023    5:20 PM 03/30/2023    5:45 PM 11/29/2022    1:11 PM  CBC  WBC 3.4 - 10.8 x10E3/uL 7.7  7.5  8.3   Hemoglobin 11.1 - 15.9 g/dL 86.2  87.1  86.4   Hematocrit 34.0 - 46.6 % 42.2  40.0  40.6   Platelets 150 - 450 x10E3/uL 290  303  283         Latest Ref Rng & Units 03/30/2023    5:45 PM 03/14/2023   10:45 AM 01/27/2023    2:59 PM  CMP  Glucose 70 - 99 mg/dL 898  89  82   BUN 6 - 20 mg/dL 9  10  13    Creatinine 0.44 - 1.00 mg/dL 8.80  8.66  8.49   Sodium 135 - 145 mmol/L 139  143  141   Potassium 3.5 - 5.1 mmol/L 3.8  4.3  5.0   Chloride 98 - 111 mmol/L 106  103  102   CO2 22 - 32 mmol/L 22  24  22    Calcium  8.9 - 10.3 mg/dL 9.0  9.4  9.6    Duplex personally reviewed 60 to 79% left carotid artery stenosis on duplex today Minimal right carotid artery stenosis  [name]. Magda, MD [facility] Phone Number: [phone] 09/12/2023  5:40 PM

## 2023-09-13 ENCOUNTER — Ambulatory Visit: Admitting: Vascular Surgery

## 2023-09-13 ENCOUNTER — Encounter: Payer: Self-pay | Admitting: Vascular Surgery

## 2023-09-13 ENCOUNTER — Ambulatory Visit (HOSPITAL_COMMUNITY)
Admission: RE | Admit: 2023-09-13 | Discharge: 2023-09-13 | Disposition: A | Discharge status: Home / Self Care | Source: Ambulatory Visit | Attending: Vascular Surgery | Admitting: Vascular Surgery

## 2023-09-13 VITALS — BP 130/79 | HR 60 | Temp 97.9°F | Ht 69.0 in | Wt 165.0 lb

## 2023-09-13 DIAGNOSIS — Z9889 Other specified postprocedural states: Secondary | ICD-10-CM | POA: Insufficient documentation

## 2023-09-13 DIAGNOSIS — I6523 Occlusion and stenosis of bilateral carotid arteries: Secondary | ICD-10-CM | POA: Insufficient documentation

## 2023-09-14 ENCOUNTER — Other Ambulatory Visit: Payer: Self-pay | Admitting: Family Medicine

## 2023-09-14 DIAGNOSIS — Z1231 Encounter for screening mammogram for malignant neoplasm of breast: Secondary | ICD-10-CM

## 2023-09-28 ENCOUNTER — Ambulatory Visit
Admission: RE | Admit: 2023-09-28 | Discharge: 2023-09-28 | Disposition: A | Discharge status: Home / Self Care | Source: Ambulatory Visit

## 2023-09-28 DIAGNOSIS — Z1231 Encounter for screening mammogram for malignant neoplasm of breast: Secondary | ICD-10-CM | POA: Diagnosis not present

## 2023-10-03 ENCOUNTER — Other Ambulatory Visit: Payer: Self-pay | Admitting: Family Medicine

## 2023-10-03 DIAGNOSIS — R7303 Prediabetes: Secondary | ICD-10-CM

## 2023-10-24 ENCOUNTER — Encounter: Payer: Self-pay | Admitting: Family Medicine

## 2023-10-27 ENCOUNTER — Other Ambulatory Visit: Payer: Self-pay | Admitting: Family Medicine

## 2023-10-29 ENCOUNTER — Other Ambulatory Visit: Payer: Self-pay | Admitting: Family Medicine

## 2023-10-29 DIAGNOSIS — R7303 Prediabetes: Secondary | ICD-10-CM

## 2023-11-01 ENCOUNTER — Ambulatory Visit (HOSPITAL_COMMUNITY)
Admission: RE | Admit: 2023-11-01 | Discharge: 2023-11-01 | Disposition: A | Discharge status: Home / Self Care | Source: Ambulatory Visit | Attending: Family Medicine | Admitting: Family Medicine

## 2023-11-01 ENCOUNTER — Emergency Department (HOSPITAL_BASED_OUTPATIENT_CLINIC_OR_DEPARTMENT_OTHER)

## 2023-11-01 ENCOUNTER — Emergency Department (HOSPITAL_BASED_OUTPATIENT_CLINIC_OR_DEPARTMENT_OTHER)
Admission: EM | Admit: 2023-11-01 | Discharge: 2023-11-01 | Disposition: A | Discharge status: Home / Self Care | Source: Ambulatory Visit

## 2023-11-01 ENCOUNTER — Encounter: Payer: Self-pay | Admitting: Student

## 2023-11-01 ENCOUNTER — Other Ambulatory Visit: Payer: Self-pay

## 2023-11-01 ENCOUNTER — Ambulatory Visit: Admitting: Student

## 2023-11-01 VITALS — BP 138/41 | HR 111 | Ht 69.0 in | Wt 166.6 lb

## 2023-11-01 DIAGNOSIS — Z7982 Long term (current) use of aspirin: Secondary | ICD-10-CM | POA: Diagnosis not present

## 2023-11-01 DIAGNOSIS — I10 Essential (primary) hypertension: Secondary | ICD-10-CM | POA: Diagnosis not present

## 2023-11-01 DIAGNOSIS — R0789 Other chest pain: Secondary | ICD-10-CM | POA: Diagnosis not present

## 2023-11-01 DIAGNOSIS — R0782 Intercostal pain: Secondary | ICD-10-CM | POA: Diagnosis not present

## 2023-11-01 DIAGNOSIS — D72829 Elevated white blood cell count, unspecified: Secondary | ICD-10-CM | POA: Insufficient documentation

## 2023-11-01 DIAGNOSIS — Z79899 Other long term (current) drug therapy: Secondary | ICD-10-CM | POA: Diagnosis not present

## 2023-11-01 DIAGNOSIS — R079 Chest pain, unspecified: Secondary | ICD-10-CM | POA: Insufficient documentation

## 2023-11-01 DIAGNOSIS — R1011 Right upper quadrant pain: Secondary | ICD-10-CM | POA: Diagnosis not present

## 2023-11-01 DIAGNOSIS — R109 Unspecified abdominal pain: Secondary | ICD-10-CM | POA: Diagnosis present

## 2023-11-01 DIAGNOSIS — I700 Atherosclerosis of aorta: Secondary | ICD-10-CM | POA: Diagnosis not present

## 2023-11-01 DIAGNOSIS — Z8673 Personal history of transient ischemic attack (TIA), and cerebral infarction without residual deficits: Secondary | ICD-10-CM | POA: Diagnosis not present

## 2023-11-01 LAB — URINALYSIS, ROUTINE W REFLEX MICROSCOPIC
Bilirubin Urine: NEGATIVE
Glucose, UA: NEGATIVE mg/dL
Ketones, ur: NEGATIVE mg/dL
Nitrite: NEGATIVE
Protein, ur: NEGATIVE mg/dL
Specific Gravity, Urine: 1.005 (ref 1.005–1.030)
pH: 5.5 (ref 5.0–8.0)

## 2023-11-01 LAB — COMPREHENSIVE METABOLIC PANEL WITH GFR
ALT: 18 U/L (ref 0–44)
AST: 16 U/L (ref 15–41)
Albumin: 4.1 g/dL (ref 3.5–5.0)
Alkaline Phosphatase: 82 U/L (ref 38–126)
Anion gap: 13 (ref 5–15)
BUN: 19 mg/dL (ref 6–20)
CO2: 27 mmol/L (ref 22–32)
Calcium: 9.5 mg/dL (ref 8.9–10.3)
Chloride: 101 mmol/L (ref 98–111)
Creatinine, Ser: 1.16 mg/dL — ABNORMAL HIGH (ref 0.44–1.00)
GFR, Estimated: 54 mL/min — ABNORMAL LOW (ref >60)
Glucose, Bld: 95 mg/dL (ref 70–99)
Potassium: 3.6 mmol/L (ref 3.5–5.1)
Sodium: 140 mmol/L (ref 135–145)
Total Bilirubin: 0.4 mg/dL (ref 0.0–1.2)
Total Protein: 6.8 g/dL (ref 6.5–8.1)

## 2023-11-01 LAB — CBC WITH DIFFERENTIAL/PLATELET
Abs Immature Granulocytes: 0.04 K/uL (ref 0.00–0.07)
Basophils Absolute: 0 K/uL (ref 0.0–0.1)
Basophils Relative: 0 %
Eosinophils Absolute: 0.1 K/uL (ref 0.0–0.5)
Eosinophils Relative: 1 %
HCT: 40.4 % (ref 36.0–46.0)
Hemoglobin: 13.2 g/dL (ref 12.0–15.0)
Immature Granulocytes: 0 %
Lymphocytes Relative: 39 %
Lymphs Abs: 4.9 K/uL — ABNORMAL HIGH (ref 0.7–4.0)
MCH: 28.2 pg (ref 26.0–34.0)
MCHC: 32.7 g/dL (ref 30.0–36.0)
MCV: 86.3 fL (ref 80.0–100.0)
Monocytes Absolute: 0.9 K/uL (ref 0.1–1.0)
Monocytes Relative: 7 %
Neutro Abs: 6.6 K/uL (ref 1.7–7.7)
Neutrophils Relative %: 53 %
Platelets: 312 K/uL (ref 150–400)
RBC: 4.68 MIL/uL (ref 3.87–5.11)
RDW: 14.9 % (ref 11.5–15.5)
WBC: 12.7 K/uL — ABNORMAL HIGH (ref 4.0–10.5)
nRBC: 0 % (ref 0.0–0.2)

## 2023-11-01 LAB — D-DIMER, QUANTITATIVE: D-Dimer, Quant: 0.29 ug{FEU}/mL (ref 0.00–0.50)

## 2023-11-01 LAB — LIPASE, BLOOD: Lipase: 54 U/L — ABNORMAL HIGH (ref 11–51)

## 2023-11-01 LAB — TROPONIN T, HIGH SENSITIVITY: Troponin T High Sensitivity: <15 ng/L (ref 0–19)

## 2023-11-01 MED ORDER — MORPHINE SULFATE (PF) 4 MG/ML IV SOLN
4.0000 mg | Freq: Once | INTRAVENOUS | Status: AC
  Administered 2023-11-01: 4 mg via INTRAVENOUS
  Filled 2023-11-01: qty 1

## 2023-11-01 NOTE — ED Provider Notes (Signed)
 [place] EMERGENCY DEPARTMENT AT [facility] Provider Note   CSN: [id number] Arrival date & time: 11/01/23  1211     Patient presents with: Abdominal Pain   [name] is a 57 y.o. female with past medical history significant for hypertension, PE, previous CVA, MS, bipolar 1 disorder who presents to the ED due to right sided pain located below right breast x 2 weeks.  Patient seen by PCP prior to arrival and sent to the ED for further evaluation.  Patient states she was gardening when symptoms started.  Notes pain has worsened over the past 2 weeks.  Denies any central chest pain or shortness of breath.  Denies nausea, vomiting, and diarrhea.  Previous history of PE while patient was pregnant.  Not on any anticoagulants.  finished a steroid taper and pain medication with no improvement in pain.  No direct injury.  Denies fever and chills.  History obtained from patient and past medical records. No interpreter used during encounter.      Prior to Admission medications   Medication Sig Start Date End Date Taking? Authorizing Provider  PERCOCET 10-325 MG tablet Take 1 tablet by mouth 3 (three) times daily as needed for pain. 10/26/23  Yes [provider]  predniSONE  (DELTASONE ) 10 MG tablet Take 6 tablets po x1 day, 5 tablets po x 1 day, 4 tablets po x 1 day, 3 tablets po x 1 day, 2 tablets po x 1 day, 1 tablet po x 1 day 03/01/23  Yes [provider]  aspirin  EC 81 MG EC tablet Take 1 tablet (81 mg total) by mouth daily. Swallow whole. 08/17/20   Krishnan, Sendil K, MD  atorvastatin  (LIPITOR ) 80 MG tablet TAKE 1 TABLET(80 MG) BY MOUTH DAILY 08/09/23   Nicholas Bar, MD  diclofenac  Sodium (VOLTAREN  ARTHRITIS PAIN) 1 % GEL Apply 2 g topically 4 (four) times daily. 04/30/22   Nicholas Bar, MD  JARDIANCE  10 MG TABS tablet TAKE 1 TABLET(10 MG) BY MOUTH DAILY 05/09/23   Christia Budds, MD  lamoTRIgine  (LAMICTAL ) 200 MG tablet TAKE 1 TABLET BY MOUTH AT BEDTIME  04/18/23   Tharon Lung, MD  mirabegron  ER (MYRBETRIQ ) 25 MG TB24 tablet Take 1 tablet (25 mg total) by mouth daily. 08/15/23   Gayland Lauraine PARAS, NP  nicotine  (NICODERM CQ  - DOSED IN MG/24 HOURS) 14 mg/24hr patch Place 1 patch (14 mg total) onto the skin daily. 04/30/22   Nicholas Bar, MD  nicotine  polacrilex (NICORETTE ) 2 MG gum Take 1 each (2 mg total) by mouth as needed for smoking cessation. 04/30/22   Nicholas Bar, MD  olmesartan  (BENICAR ) 40 MG tablet Take 1 tablet (40 mg total) by mouth at bedtime. 06/28/23   Nicholas Bar, MD  OZEMPIC , 1 MG/DOSE, 4 MG/3ML SOPN INJECT 1 MG INTO SKIN ONCE WEEKLY 11/01/23   Nicholas Bar, MD  Polyethyl Glycol-Propyl Glycol (SYSTANE OP) Place 1 drop into both eyes in the morning.    [provider]  STIOLTO RESPIMAT  2.5-2.5 MCG/ACT AERS INHALE 2 PUFFS INTO THE LUNGS DAILY 08/29/23   Nicholas Bar, MD  traZODone  (DESYREL ) 100 MG tablet TAKE 1 TABLET BY MOUTH DAILY 10/27/23   Nicholas Bar, MD  Vitamin E 180 MG (400 UNIT) CAPS Take 1 capsule by mouth daily. Pt states she is unsure about strength    [provider]    Allergies: Iohexol , Diazepam, and Sulfa antibiotics    Review of Systems  Constitutional:  Negative for chills and fever.  Respiratory:  Negative for shortness of breath.   Cardiovascular:  Positive for chest pain. Negative for leg swelling.  Gastrointestinal:  Negative for abdominal pain, diarrhea, nausea and vomiting.    Updated Vital Signs BP 114/88   Pulse 89   Temp 98.8 F (37.1 C) (Oral)   Resp (!) 25   SpO2 99%   Physical Exam Vitals and nursing note reviewed.  Constitutional:      General: She is not in acute distress.    Appearance: She is not ill-appearing.  HENT:     Head: Normocephalic.  Eyes:     Pupils: Pupils are equal, round, and reactive to light.  Cardiovascular:     Rate and Rhythm: Normal rate and regular rhythm.     Pulses: Normal pulses.     Heart sounds: Normal heart sounds. No murmur  heard.    No friction rub. No gallop.  Pulmonary:     Effort: Pulmonary effort is normal.     Breath sounds: Normal breath sounds.  Chest:       Comments: Reproducible tenderness under right breast. Abdominal:     General: Abdomen is flat. There is no distension.     Palpations: Abdomen is soft.     Tenderness: There is no abdominal tenderness. There is no guarding or rebound.  Musculoskeletal:        General: Normal range of motion.     Cervical back: Neck supple.  Skin:    General: Skin is warm and dry.  Neurological:     General: No focal deficit present.     Mental Status: She is alert.  Psychiatric:        Mood and Affect: Mood normal.        Behavior: Behavior normal.     (all labs ordered are listed, but only abnormal results are displayed) Labs Reviewed  COMPREHENSIVE METABOLIC PANEL WITH GFR - Abnormal; Notable for the following components:      Result Value   Creatinine, Ser 1.16 (*)    GFR, Estimated 54 (*)    All other components within normal limits  LIPASE, BLOOD - Abnormal; Notable for the following components:   Lipase 54 (*)    All other components within normal limits  CBC WITH DIFFERENTIAL/PLATELET - Abnormal; Notable for the following components:   WBC 12.7 (*)    Lymphs Abs 4.9 (*)    All other components within normal limits  URINALYSIS, ROUTINE W REFLEX MICROSCOPIC - Abnormal; Notable for the following components:   Color, Urine COLORLESS (*)    Hgb urine dipstick TRACE (*)    Leukocytes,Ua MODERATE (*)    Bacteria, UA RARE (*)    All other components within normal limits  URINE CULTURE  D-DIMER, QUANTITATIVE  TROPONIN T, HIGH SENSITIVITY    EKG: EKG Interpretation Date/Time:  Tuesday November 01 2023 15:46:25 EDT Ventricular Rate:  81 PR Interval:  129 QRS Duration:  75 QT Interval:  352 QTC Calculation: 409 R Axis:   76  Text Interpretation: Sinus rhythm Confirmed by Simon Rea [phone]) on 11/01/2023 6:34:24 PM  Radiology: DG  Chest Portable 1 View Result Date: 11/01/2023 EXAM: 1 VIEW(S) XRAY OF THE CHEST 11/01/2023 03:22:00 PM COMPARISON: Lung cancer screening CT from 02/03/2023. CLINICAL HISTORY: CP FINDINGS: LINES, TUBES AND DEVICES: Thoracic rod. LUNGS AND PLEURA: 1.5 cm right mid lung zone nodule which corresponds to the lung nodule identified on the lung cancer screening CT from 02/03/2023. Please refer to the report from the previous lung  cancer screening CT for follow-up recommendations. No pulmonary edema. No pleural effusion. No pneumothorax. HEART AND MEDIASTINUM: Aortic calcification. No acute abnormality of the cardiac and mediastinal silhouettes. BONES AND SOFT TISSUES: S-shaped thoracic scoliosis. IMPRESSION: 1. Questionable 1.5 cm right mid lung zone nodule corresponding to the prior LDCT finding from 02/03/2023. Please refer to the prior report for follow-up recommendations. 2. No acute findings. Electronically signed by: Waddell Calk MD 11/01/2023 03:56 PM EDT RP Workstation: [id number]     Procedures   Medications Ordered in the ED  morphine  (PF) 4 MG/ML injection 4 mg (4 mg Intravenous Given 11/01/23 1556)    Clinical Course as of 11/01/23 1901  Tue Nov 01, 2023  1639 D-Dimer, Quant: 0.29 [CA]  1639 WBC(!): 12.7 [CA]    Clinical Course User Index [CA] Lorelle Aleck BROCKS, PA-C                                 Medical Decision Making Amount and/or Complexity of Data Reviewed External Data Reviewed: notes.    Details: PCP note Labs: ordered. Decision-making details documented in ED Course. Radiology: ordered. ECG/medicine tests: ordered and independent interpretation performed. Decision-making details documented in ED Course.  Risk Prescription drug management.    This patient presents to the ED for concern of right sided chest pain, this involves an extensive number of treatment options, and is a complaint that carries with it a high risk of complications and morbidity.  The  differential diagnosis includes ACS, PE, PNA, MSK etiology  58 year old female presents to the ED due to right sided breast pain x 2 weeks.  Seen by PCP and sent to the ED to rule out cardiac etiology of pain.  Previous history of PE while pregnant.  Not on any blood thinners.  Denies nausea, vomiting, and diarrhea.  Upon arrival patient mildly tachycardic at 101 with otherwise reassuring vitals.  O2 saturation 100% on room air.  Patient has reproducible tenderness under right breast without crepitus or deformity.  No rash to suggest shingles.  No lower extremity edema.  Routine labs ordered.  Troponin to rule out ACS. Abdominal labs to rule out gallbladder/liver etiology.   CBC with slight leukocytosis at 12.7.  Normal hemoglobin.  CMP reassuring.  Mild elevation in creatinine 1.1.  No major electrolyte derangements.  D-Dimer normal.  Low suspicion for PE.  Troponin normal. EKG NSR, no signs of acute ischemia. Low suspicion for ACS.  Lipase mildly elevated at 54.  Patient has no epigastric tenderness.  Given patient does have some right upper quadrant tenderness we will obtain right upper quadrant ultrasound to rule out acute cholecystitis.  If unremarkable could be related to musculoskeletal etiology given reproducible nature on exam.  Patient handed off to [facility], PA-C at shift change pending US . If unremarkable, can treat for MSK etiology  Co morbidities that complicate the patient evaluation  Hx PE Cardiac Monitoring: / EKG:  The patient was maintained on a cardiac monitor.  I personally viewed and interpreted the cardiac monitored which showed an underlying rhythm of: NSR     Final diagnoses:  Right-sided chest pain    ED Discharge Orders     None          Lorelle Aleck BROCKS DEVONNA 11/01/23 1902    Simon Lavonia SAILOR, MD 11/01/23 [phone]

## 2023-11-01 NOTE — ED Notes (Signed)
 DC paperwork given and verbally understood.

## 2023-11-01 NOTE — ED Provider Notes (Signed)
  Physical Exam   Vitals:   11/01/23 1757 11/01/23 1805 11/01/23 1807 11/01/23 1830  BP:  (!) 140/70  114/88  Pulse:  (!) 106 91 89  Resp:  18 19 (!) 25  Temp: 98.8 F (37.1 C)     TempSrc: Oral     SpO2:   95% 99%     Physical Exam  Procedures  Procedures  ED Course / MDM   Clinical Course as of 11/01/23 1904  Tue Nov 01, 2023  1639 D-Dimer, Quant: 0.29 [CA]  1639 WBC(!): 12.7 [CA]    Clinical Course User Index [CA] Lorelle Aleck BROCKS, PA-C   Medical Decision Making Amount and/or Complexity of Data Reviewed Labs: ordered. Decision-making details documented in ED Course. Radiology: ordered.  Risk Prescription drug management.   Patient received at shift change from Caroline Aberman PA-C, please see their note for initial history, physical exam findings, lab/imaging interpretations, and initial assessment/plan.  Patient presenting with right upper quadrant/right sided pain inferior to breast for 2 weeks.  Patient contacted her PCP today in regard to her symptoms and was instructed to come to the emergency department for further evaluation.  No chest pain, shortness of breath, history of PE which was provoked by pregnancy, not anticoagulated.  Pain is reproducible on physical exam, labs notable for mild leukocytosis, minimally elevated lipase at 54, D-dimer within normal limits.  Urine is with moderate leukocytes and rare bacteria, will send for culture as patient is asymptomatic.  RUQ US  pending at shift change.  US  results: 1. The patient experienced pain with transducer pressure; however, there are no additional signs of acute cholecystitis. 2. Large amount of gas in the duodenum in the right upper quadrant limits assessment   At time of my reassessment, I discussed the patient's lab/imaging findings in depth.  She does have a minimally elevated lipase, however no epigastric tenderness on exam, tenderness is localized to the right upper quadrant and is reproducible,  low suspicion for ACS/PE/gallbladder pathology as above.  I suspect that her symptoms are likely musculoskeletal in nature, given the fact that it is reproducible on exam. She is already on many pain medications for management of her chronic back pain.  I recommend that she continue her pain management regimen as previously directed.  She voiced understanding, I recommend that she follow-up with her PCP if her symptoms persist.  Return precautions discussed, she is appropriate for discharge at this time.        Glendia Rocky SAILOR, PA-C 11/01/23 2006    Simon Lavonia SAILOR, MD 11/01/23 [phone]

## 2023-11-01 NOTE — Patient Instructions (Signed)
 I am sending you to the Emergency room to have a work up for chest pain and pulmonary embolism with your prior history.   I am checking an EKG of your heart to make sure I don't see changes before sending you by private vehicle.    Future Appointments  Date Time Provider Department Center  01/10/2024 10:30 AM Onita Duos, MD [facility] None  06/18/2024  3:40 PM [facility] ANNUAL WELLNESS VISIT [facility]    Please arrive 15 minutes before your appointment to ensure smooth check in process.  If you are more than 15 minutes late, you may be asked to reschedule.   Please bring a list of your medications with you to all appointments.   Please call the clinic at [phone] if your symptoms worsen or you have any concerns.  Thank you for allowing me to participate in your care, Dr. Damien Pinal [facility]

## 2023-11-01 NOTE — ED Notes (Signed)
 PT placed in room and told to change into gown per protocols, warm blankets provided as well. PT given a UA collection kit and instructed on restroom locations, not able to provide at this time.

## 2023-11-01 NOTE — Discharge Instructions (Signed)
 I suspect that your symptoms today are likely musculoskeletal in nature, as your labs/imaging in the emergency department today were reassuring and do not show signs of conditions like a heart attack, pulmonary embolism, or gallstones/gallbladder inflammation.  Please continue your pain management regimen at home as previously directed.  Follow-up with your primary care provider if your symptoms persist.  Return to the emergency department if your symptoms worsen.

## 2023-11-01 NOTE — Progress Notes (Signed)
    SUBJECTIVE:   CHIEF COMPLAINT / HPI:   [name] is a 58 year old female with a history of multiple sclerosis and prior pulmonary embolism who presents with persistent right-sided chest pain.  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  OBJECTIVE:   BP (!) 138/41   Pulse (!) 111   Ht 5' 9 (1.753 m)   Wt 166 lb 9.6 oz (75.6 kg)   SpO2 100%   BMI 24.60 kg/m   Well-appearing, no acute distress Cardio: Regular rate, regular rhythm, 3/6 systolic murmur on exam. Pulm: Clear, no wheezing, no crackles. No increased work of breathing Abdominal: bowel sounds present, soft, non-tender, non-distended Neuro: alert and oriented x3, speech normal in content, no facial asymmetry, pupils equal and reactive to light.  Skin: lidocaine  patch present under left breast without signs of rash   ASSESSMENT/PLAN:   Assessment & Plan Intercostal pain Right-sided chest wall pain with concern for cardiopulmonary etiology Persistent pain unrelieved by Percocet, steroid course or lidocaine  patches. Differential includes pulmonary embolism, myocardial infarction, or shingles although with one week history should expect to see herpetic rash at this point. History of vascular disease and prior pulmonary embolism increases risk. Tachycardia raises concern for embolism or cardiac event. Shared decision making discussed  regarding emergency evaluation. - Refer to emergency department for further evaluation and management. - Order blood work including cardiac enzymes every two hours. - Order EKG to assess for cardiac abnormalities. - Discuss potential for admission if cardiopulmonary event is confirmed.   Called RN at ED to discuss patient presentation.   Damien Pinal, DO [facility]

## 2023-11-01 NOTE — ED Triage Notes (Signed)
 C/o RUQ pain that wraps around to back x 1 week., Denies n/v/d.  Reports pain started after planting flowers. Hx of chronic back problems

## 2023-11-01 NOTE — ED Notes (Signed)
 Pt aware of the need for a urine... Pt currently unable to provide a sample.SABRASABRASABRA

## 2023-11-02 ENCOUNTER — Telehealth: Payer: Self-pay

## 2023-11-02 DIAGNOSIS — R0782 Intercostal pain: Secondary | ICD-10-CM

## 2023-11-02 LAB — URINE CULTURE: Culture: >=100000 — AB

## 2023-11-02 NOTE — Telephone Encounter (Signed)
 Patient calls nurse line regarding follow up from yesterday's visit. She reports that she received evaluation at [facility] ED. She waited for 12 hours and was told that pain is likely related to bad muscle strain.   Patient reports that she asked provider for muscle relaxant to help with pain, however, they told her that she would need to reach out to PCP office regarding this.   Patient is asking if this is something that Dr. Cleotilde could send in for her. Advised that I would send message to Dr. Cleotilde to see if this could be prescribed or if she would need to come back in. Patient states that she will not be coming back in for this.   Please advise.   Chiquita JAYSON English, RN

## 2023-11-03 ENCOUNTER — Telehealth (HOSPITAL_BASED_OUTPATIENT_CLINIC_OR_DEPARTMENT_OTHER): Payer: Self-pay

## 2023-11-03 MED ORDER — CYCLOBENZAPRINE HCL 5 MG PO TABS: 5.0000 mg | ORAL_TABLET | Freq: Three times a day (TID) | ORAL | 0 refills | Status: DC | PRN

## 2023-11-03 NOTE — Telephone Encounter (Signed)
 Post ED Visit - Positive Culture Follow-up  Culture report reviewed by antimicrobial stewardship pharmacist: [facility] []  Rankin Dee, Pharm.D. []  Venetia Gully, Pharm.D., BCPS AQ-ID []  Garrel Crews, Pharm.D., BCPS []  Almarie Lunger, Pharm.D., BCPS []  [place], [place].D., BCPS, AAHIVP []  Rosaline Bihari, Pharm.D., BCPS, AAHIVP []  Vernell Meier, PharmD, BCPS []  Latanya Hint, PharmD, BCPS []  Donald Medley, PharmD, BCPS []  Rocky Bold, PharmD []  Dorothyann Alert, PharmD, BCPS []  Morene Babe, PharmD X   Powell Blush, PharmD  [facility] []  Rosaline Edison, PharmD []  Romona Bliss, PharmD []  Dolphus Roller, PharmD []  Veva Seip, Rph []  Vernell Daunt) Leonce, PharmD []  Eva Allis, PharmD []  Rosaline Millet, PharmD []  Iantha Batch, PharmD []  Arvin Gauss, PharmD []  Wanda Hasting, PharmD []  Ronal Rav, PharmD []  Rocky Slade, PharmD []  Bard Jeans, PharmD   Positive urine culture Treated with no abx, organism sensitive to the same and no further patient follow-up is required at this time.  No f/u  asymptomatic bacteruria  [name] 11/03/2023, 12:10 PM

## 2023-11-03 NOTE — Telephone Encounter (Signed)
 Called patient, she did not answer. LVM advising patient to contact our office for update.   Kimberly JAYSON English, RN

## 2023-11-03 NOTE — Telephone Encounter (Signed)
 Sent prescription for flexeril  5 mg for intercostal pain. Patient will need follow up if pain does not improve.   Damien Pinal, DO [facility], PGY-3 11/03/23 8:36 AM

## 2023-11-15 DIAGNOSIS — M5412 Radiculopathy, cervical region: Secondary | ICD-10-CM | POA: Diagnosis not present

## 2023-11-16 DIAGNOSIS — S52202D Unspecified fracture of shaft of left ulna, subsequent encounter for closed fracture with routine healing: Secondary | ICD-10-CM | POA: Diagnosis not present

## 2023-11-18 ENCOUNTER — Telehealth (HOSPITAL_BASED_OUTPATIENT_CLINIC_OR_DEPARTMENT_OTHER): Payer: Self-pay

## 2023-11-18 ENCOUNTER — Telehealth (HOSPITAL_BASED_OUTPATIENT_CLINIC_OR_DEPARTMENT_OTHER): Payer: Self-pay | Admitting: *Deleted

## 2023-11-18 NOTE — Telephone Encounter (Signed)
 Pt has been scheduled as add on ok per preop APP Barnie Hila, NP 11/21/23. Med rec and consent are done.

## 2023-11-18 NOTE — Telephone Encounter (Signed)
   Pre-operative Risk Assessment    Patient Name: [name]  DOB: [birth year] MRN: [id number]   Date of last office visit: 04/01/22 with Vannie Date of next office visit: NA   Request for Surgical Clearance    Procedure:  Left ulna removal of hardware and extensor carpi ulnaris tenolysis  Date of Surgery:  Clearance TBD                                 Surgeon:  Dr. Lynwood Rias Surgeon's Group or Practice Name:  [facility] Phone number:  [phone] Fax number:  [phone]- megan davis   Type of Clearance Requested:   - Medical  - Pharmacy:  Hold Aspirin  not indicated   Type of Anesthesia:  Not Indicated   Additional requests/questions:    [name]   11/18/2023, 9:37 AM

## 2023-11-18 NOTE — Telephone Encounter (Signed)
 Pt has been scheduled as add on ok per preop APP Barnie Hila, NP 11/21/23. Med rec and consent are done.       Patient Consent for Virtual Visit        [name] has provided verbal consent on 11/18/2023 for a virtual visit (video or telephone).   CONSENT FOR VIRTUAL VISIT FOR:  [name]  By participating in this virtual visit I agree to the following:  I hereby voluntarily request, consent and authorize [facility] and its employed or contracted physicians, physician assistants, nurse practitioners or other licensed health care professionals (the Practitioner), to provide me with telemedicine health care services (the "Services) as deemed necessary by the treating Practitioner. I acknowledge and consent to receive the Services by the Practitioner via telemedicine. I understand that the telemedicine visit will involve communicating with the Practitioner through live audiovisual communication technology and the disclosure of certain medical information by electronic transmission. I acknowledge that I have been given the opportunity to request an in-person assessment or other available alternative prior to the telemedicine visit and am voluntarily participating in the telemedicine visit.  I understand that I have the right to withhold or withdraw my consent to the use of telemedicine in the course of my care at any time, without affecting my right to future care or treatment, and that the Practitioner or I may terminate the telemedicine visit at any time. I understand that I have the right to inspect all information obtained and/or recorded in the course of the telemedicine visit and may receive copies of available information for a reasonable fee.  I understand that some of the potential risks of receiving the Services via telemedicine include:  Delay or interruption in medical evaluation due to technological equipment failure or disruption; Information transmitted  may not be sufficient (e.g. poor resolution of images) to allow for appropriate medical decision making by the Practitioner; and/or  In rare instances, security protocols could fail, causing a breach of personal health information.  Furthermore, I acknowledge that it is my responsibility to provide information about my medical history, conditions and care that is complete and accurate to the best of my ability. I acknowledge that Practitioner's advice, recommendations, and/or decision may be based on factors not within their control, such as incomplete or inaccurate data provided by me or distortions of diagnostic images or specimens that may result from electronic transmissions. I understand that the practice of medicine is not an exact science and that Practitioner makes no warranties or guarantees regarding treatment outcomes. I acknowledge that a copy of this consent can be made available to me via my patient portal [facility] MyChart), or I can request a printed copy by calling the office of [facility].    I understand that my insurance will be billed for this visit.   I have read or had this consent read to me. I understand the contents of this consent, which adequately explains the benefits and risks of the Services being provided via telemedicine.  I have been provided ample opportunity to ask questions regarding this consent and the Services and have had my questions answered to my satisfaction. I give my informed consent for the services to be provided through the use of telemedicine in my medical care

## 2023-11-18 NOTE — Telephone Encounter (Signed)
   Name: [name]  DOB: 02-10-1965  MRN: [id number]  Primary Cardiologist: Shelda Bruckner, MD   Preoperative team, please contact this patient and set up a phone call appointment for further preoperative risk assessment. Please obtain consent and complete medication review. Thank you for your help.  I confirm that guidance regarding antiplatelet and oral anticoagulation therapy has been completed and, if necessary, noted below.  Ideally aspirin  should be continued without interruption, however if the bleeding risk is too great, aspirin  may be held for 5-7 days prior to surgery. Please resume aspirin  post operatively when it is felt to be safe from a bleeding standpoint.     I also confirmed the patient resides in the state of [place] . As per [facility] Medical Board telemedicine laws, the patient must reside in the state in which the provider is licensed.   Lamarr Satterfield, NP 11/18/2023, 10:00 AM [facility]

## 2023-11-21 ENCOUNTER — Ambulatory Visit: Admitting: Student

## 2023-11-21 DIAGNOSIS — Z01810 Encounter for preprocedural cardiovascular examination: Secondary | ICD-10-CM

## 2023-11-21 NOTE — Progress Notes (Signed)
 Virtual Visit via Telephone Note   Because of [name]'s co-morbid illnesses, she is at least at moderate risk for complications without adequate follow up.  This format is felt to be most appropriate for this patient at this time.  The patient did not have access to video technology/had technical difficulties with video requiring transitioning to audio format only (telephone).  All issues noted in this document were discussed and addressed.  No physical exam could be performed with this format.  Please refer to the patient's chart for her consent to telehealth for [facility].  Evaluation Performed:  Preoperative cardiovascular risk assessment _____________   Date:  11/21/2023   Patient ID:  [name], DOB 1965/11/15, MRN [id number] Patient Location:  Home Provider location:   Office  Primary Care Provider:  Nicholas Bar, MD  Primary Cardiologist:  [facility] Providers Cardiologist:  Shelda Bruckner, MD    Chief Complaint / Patient Profile   58 y.o. y/o female with a h/o carotid artery stenosis, hypertension, hyperlipidemia, CVA, MS, CKD, tobacco abuse, COPD who is pending left ulna removal of hardware and extensor carpi ulnaris tenolysis by Dr. Alyse and presents today for telephonic preoperative cardiovascular risk assessment.  History of Present Illness    [name] is a 58 y.o. female who presents via audio/video conferencing for a telehealth visit today.  Pt was last seen in cardiology clinic on 04/01/2022 by Reche Finder, NP.  At that time MAGABY RUMBERGER was stable from a cardiac standpoint.  The patient is now pending procedure as outlined above. Since her last visit, she is doing well. Patient denies shortness of breath, dyspnea on exertion, lower extremity edema, orthopnea or PND. No chest pain, pressure, or tightness. No palpitations. She is not experiencing lightheadedness, dizziness, presyncope or syncope. She is active  walking her dog 1/2-1 mile per day. She is independent with ADLs and able to perform light to moderate household activities.   Past Medical History    Past Medical History:  Diagnosis Date   Arthritis    Bipolar 1 disorder (HCC)    Carotid artery occlusion    Chronic kidney disease    donated left kidney   Diabetes mellitus without complication (HCC)    Heart murmur    High serum vitamin B12 12/01/2020   Hyperlipidemia    Hypertension    Hypokalemia 08/15/2020   Leukocytosis 12/06/2017   Multiple sclerosis    Neuromuscular disorder (HCC)    Pneumonia 2010   had 2 episodes/ was sent home on O2   Prediabetes    Pulmonary embolism (HCC) 1994   Only 1 clot, not on lifelong anticoagulation   Sinus tachycardia 12/07/2017   Stroke [facility])    TIA   Past Surgical History:  Procedure Laterality Date   ANTERIOR CERVICAL DECOMP/DISCECTOMY FUSION N/A 02/25/2021   Procedure: Anterior Cervical Discectomy Fusion - Cervical Three-Cervical Four;  Surgeon: Joshua Alm RAMAN, MD;  Location: [facility] OR;  Service: Neurosurgery;  Laterality: N/A;  Anterior Cervical Discectomy Fusion - Cervical Three-Cervical Four   APPENDECTOMY  2001   ENDARTERECTOMY Left 08/20/2020   Procedure: LEFT CAROTID ARTERY ENDARTERECTOMY;  Surgeon: Magda [name], MD;  Location: [facility] OR;  Service: Vascular;  Laterality: Left;   Endometriosis s/p left fallopian tube removal.  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 Authorizing Provider  aspirin  EC 81 MG EC tablet Take 1 tablet (81 mg total) by mouth daily. Swallow whole. 08/17/20   Krishnan, Sendil K, MD  atorvastatin  (LIPITOR ) 80 MG tablet TAKE 1 TABLET(80 MG) BY MOUTH DAILY 08/09/23   Nicholas Bar, MD  cyclobenzaprine  (FLEXERIL ) 5 MG tablet Take 1 tablet (5 mg total) by mouth 3 (three) times daily as needed for muscle spasms. 11/03/23   Cleotilde Perkins, DO  diclofenac  Sodium (VOLTAREN  ARTHRITIS PAIN) 1 % GEL Apply 2 g topically 4 (four) times daily. 04/30/22   Nicholas Bar, MD  JARDIANCE  10 MG TABS tablet TAKE 1 TABLET(10 MG) BY MOUTH DAILY 05/09/23   Christia Budds, MD  lamoTRIgine  (LAMICTAL ) 200 MG tablet TAKE 1 TABLET BY MOUTH AT BEDTIME 04/18/23   Tharon Lung, MD  mirabegron  ER (MYRBETRIQ ) 25 MG TB24 tablet Take 1 tablet (25 mg total) by mouth daily. 08/15/23   Gayland Lauraine PARAS, NP  nicotine  (NICODERM CQ  - DOSED IN MG/24 HOURS) 14 mg/24hr patch Place 1 patch (14 mg total) onto the skin daily. 04/30/22   Nicholas Bar, MD  nicotine  polacrilex (NICORETTE ) 2 MG gum Take 1 each (2 mg total) by mouth as needed for smoking cessation. 04/30/22   Nicholas Bar, MD  olmesartan  (BENICAR ) 40 MG tablet Take 1 tablet (40 mg total) by mouth at bedtime. 06/28/23   Nicholas Bar, MD  OZEMPIC , 1 MG/DOSE, 4 MG/3ML SOPN INJECT 1 MG INTO SKIN ONCE WEEKLY 11/01/23   Nicholas Bar, MD  Polyethyl Glycol-Propyl Glycol (SYSTANE OP) Place 1 drop into both eyes in the morning.    [provider]  STIOLTO RESPIMAT  2.5-2.5 MCG/ACT AERS INHALE 2 PUFFS INTO THE LUNGS DAILY 08/29/23   Nicholas Bar, MD  traZODone  (DESYREL ) 100 MG tablet TAKE 1 TABLET BY MOUTH DAILY 10/27/23   Nicholas Bar, MD  Vitamin E 180 MG (400 UNIT) CAPS Take 1 capsule by mouth daily. Pt states she is unsure about strength    [provider]    Physical Exam    Vital Signs:  RMONI KEPLINGER does not have vital signs available for review today.  Given telephonic nature of  communication, physical exam is limited. AAOx3. NAD. Normal affect.  Speech and respirations are unlabored.   Assessment & Plan    Preoperative cardiovascular risk assessment. Left ulna removal of hardware and extensor carpi ulnaris tenolysis by Dr. Alyse.  Chart reviewed as part of pre-operative protocol coverage. According to the RCRI, patient has a 0.9% risk of MACE. Patient reports activity equivalent to 5.07 METS (per [employer]ASI).   Given past medical history and time since last visit, based on ACC/AHA guidelines, ATLEIGH GRUEN would be at acceptable risk for the planned procedure without further cardiovascular testing.   Patient was advised that if she develops new symptoms prior to surgery to contact our office to arrange a follow-up appointment.  she verbalized understanding.  Ideally aspirin  should be continued without interruption, however if the bleeding risk is too great, aspirin  may be held  for 5-7 days prior to surgery. Please resume aspirin  post operatively when it is felt to be safe from a bleeding standpoint.    I will route this recommendation to the requesting party via Epic fax function.  Please call with questions.  Time:   Today, I have spent 6 minutes with the patient with telehealth technology discussing medical history, symptoms, and management plan.     Barnie Hila, NP  11/21/2023, 3:35 PM

## 2023-11-24 ENCOUNTER — Ambulatory Visit: Payer: Self-pay | Admitting: Family Medicine

## 2023-11-24 ENCOUNTER — Encounter: Payer: Self-pay | Admitting: Family Medicine

## 2023-11-24 ENCOUNTER — Ambulatory Visit: Admitting: Family Medicine

## 2023-11-24 VITALS — BP 121/75 | HR 107 | Ht 69.0 in | Wt 166.0 lb

## 2023-11-24 DIAGNOSIS — Z01818 Encounter for other preprocedural examination: Secondary | ICD-10-CM

## 2023-11-24 DIAGNOSIS — R7303 Prediabetes: Secondary | ICD-10-CM | POA: Diagnosis not present

## 2023-11-24 LAB — POCT GLYCOSYLATED HEMOGLOBIN (HGB A1C): HbA1c, POC (prediabetic range): 5.9 % (ref 5.7–6.4)

## 2023-11-24 MED ORDER — OZEMPIC (1 MG/DOSE) 4 MG/3ML ~~LOC~~ SOPN: 1.0000 mg | PEN_INJECTOR | SUBCUTANEOUS | 0 refills | Status: DC

## 2023-11-24 NOTE — Patient Instructions (Addendum)
 It was wonderful to see you today.  Please bring ALL of your medications with you to every visit.   Today we talked about:  Preop exam- I am glad you are getting your arm fixed. For your surgery hold your ozempic  that week. Do not take your trazodone , mirabegron , or olmesartan  either than morning if you take it in the morning or the night before if you take it at night. Hold your aspirin  per your cardiologist's recommendations.   We discussed trying to reduce your tobacco use as much as possible before your surgery to prevent respiratory complications and improve wound healing.   Please let us  know if you have any other questions.   Thank you for choosing [facility].   Please call [phone] with any questions about today's appointment.  Areta Saliva, MD  Family Medicine

## 2023-11-24 NOTE — Progress Notes (Signed)
 Surgical Pre-operative Evaluation:   Procedure:  left ulna removal of hardware and extensor carpi ulnaris tenolysis by Dr. Alyse  Procedural risk: intermediate    Anesthesia: general  PMH: HTN H/o CVA H/o carotid stenosis  H/o PE  COPD MS CKD stage III Bipolar 1 disorder  Kidney donor  Tobacco use  Prediabetes   Surgical History:  The patient denies any complication with anesthesia, bleeding, or post-operative confusion, nausea, or vomiting in prior surgical interventions. The patient has history of anterior cervical fusion, appendectomy, endarterectomy, left fallopian tube removal, nephrectomy, tonsillectomy     Family History: The patient denies any family history of complications with anesthesia and VTE. (Patient has personal history of PE)   Social History: Tobacco Use: Currently 0.5 pack a day, wants to quit, will trying using  quit hotline. Counseled on smoking cessation before surgery.  Alcohol  Use: rarely  Other substance use: none  Screening for OSA: STOP BANG score of 1, low risk of OSA  Risk Stratification Tool: Charlanne Calculator: 0.1 % risk of MI or cardiac event, intraoperatively or up to 30 days post-op  Functional Capacity:  5.07 METS   Final Assessment:  The patient is at low risk of complications from an intermediate risk surgery.  I recommend the following additional tests: BMP today, A1c today  I recommend the following changes to medications in the perioperative setting: Medication Adjustments:  - Hold olmesartan  day of surgery  - Hold trazadone and mirebegron night before surgery.  - Hold ozempic  week of surgery (hold on Monday as patient takes it on this day)  - Hold ASA per cardiology recommendations  Ideally aspirin  should be continued without interruption, however if the bleeding risk is too great, aspirin  may be held for 5-7 days prior to surgery. Please resume aspirin  post operatively when it is felt to be safe from a bleeding  standpoint.

## 2023-11-25 LAB — BASIC METABOLIC PANEL WITH GFR
BUN/Creatinine Ratio: 12 (ref 9–23)
BUN: 14 mg/dL (ref 6–24)
CO2: 25 mmol/L (ref 20–29)
Calcium: 9.7 mg/dL (ref 8.7–10.2)
Chloride: 103 mmol/L (ref 96–106)
Creatinine, Ser: 1.15 mg/dL — ABNORMAL HIGH (ref 0.57–1.00)
Glucose: 79 mg/dL (ref 70–99)
Potassium: 4.6 mmol/L (ref 3.5–5.2)
Sodium: 144 mmol/L (ref 134–144)
eGFR: 55 mL/min/1.73 — ABNORMAL LOW (ref >59)

## 2023-11-29 ENCOUNTER — Other Ambulatory Visit: Payer: Self-pay | Admitting: Family Medicine

## 2023-12-02 DIAGNOSIS — S52202D Unspecified fracture of shaft of left ulna, subsequent encounter for closed fracture with routine healing: Secondary | ICD-10-CM | POA: Diagnosis not present

## 2023-12-02 DIAGNOSIS — M67834 Other specified disorders of tendon, left wrist: Secondary | ICD-10-CM | POA: Diagnosis not present

## 2023-12-02 DIAGNOSIS — M65832 Other synovitis and tenosynovitis, left forearm: Secondary | ICD-10-CM | POA: Diagnosis not present

## 2023-12-02 DIAGNOSIS — M65842 Other synovitis and tenosynovitis, left hand: Secondary | ICD-10-CM | POA: Diagnosis not present

## 2023-12-02 DIAGNOSIS — Z472 Encounter for removal of internal fixation device: Secondary | ICD-10-CM | POA: Diagnosis not present

## 2023-12-14 DIAGNOSIS — S52202D Unspecified fracture of shaft of left ulna, subsequent encounter for closed fracture with routine healing: Secondary | ICD-10-CM | POA: Diagnosis not present

## 2023-12-24 ENCOUNTER — Other Ambulatory Visit: Payer: Self-pay | Admitting: Family Medicine

## 2024-01-02 ENCOUNTER — Ambulatory Visit: Admitting: Neurology

## 2024-01-10 ENCOUNTER — Ambulatory Visit: Admitting: Neurology

## 2024-01-10 ENCOUNTER — Encounter: Payer: Self-pay | Admitting: Neurology

## 2024-01-10 VITALS — BP 117/76 | HR 100 | Ht 69.0 in | Wt 164.0 lb

## 2024-01-10 DIAGNOSIS — I639 Cerebral infarction, unspecified: Secondary | ICD-10-CM | POA: Diagnosis not present

## 2024-01-10 DIAGNOSIS — Z79899 Other long term (current) drug therapy: Secondary | ICD-10-CM | POA: Insufficient documentation

## 2024-01-10 DIAGNOSIS — I6522 Occlusion and stenosis of left carotid artery: Secondary | ICD-10-CM

## 2024-01-10 DIAGNOSIS — M4802 Spinal stenosis, cervical region: Secondary | ICD-10-CM

## 2024-01-10 DIAGNOSIS — G35A Relapsing-remitting multiple sclerosis: Secondary | ICD-10-CM
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
## 2024-01-12 LAB — COMPREHENSIVE METABOLIC PANEL WITH GFR
ALT: 13 IU/L (ref 0–32)
AST: 14 IU/L (ref 0–40)
Albumin: 4.5 g/dL (ref 3.8–4.9)
Alkaline Phosphatase: 95 IU/L (ref 49–135)
BUN/Creatinine Ratio: 11 (ref 9–23)
BUN: 13 mg/dL (ref 6–24)
Bilirubin Total: 0.4 mg/dL (ref 0.0–1.2)
CO2: 21 mmol/L (ref 20–29)
Calcium: 9.7 mg/dL (ref 8.7–10.2)
Chloride: 103 mmol/L (ref 96–106)
Creatinine, Ser: 1.19 mg/dL — ABNORMAL HIGH (ref 0.57–1.00)
Globulin, Total: 2.7 g/dL (ref 1.5–4.5)
Glucose: 62 mg/dL — ABNORMAL LOW (ref 70–99)
Potassium: 4.3 mmol/L (ref 3.5–5.2)
Sodium: 143 mmol/L (ref 134–144)
Total Protein: 7.2 g/dL (ref 6.0–8.5)
eGFR: 53 mL/min/1.73 — ABNORMAL LOW

## 2024-01-12 LAB — IGG, IGA, IGM
IgG (Immunoglobin G), Serum: 1038 mg/dL (ref 586–1602)
IgM (Immunoglobulin M), Srm: 140 mg/dL (ref 26–217)
Immunoglobulin A, (IgA) QN, Serum: 404 mg/dL — ABNORMAL HIGH (ref 87–352)

## 2024-01-12 LAB — CD20 B CELLS
% CD19-B Cells: 0 % — ABNORMAL LOW (ref 4.6–22.1)
% CD20-B Cells: 0 % — ABNORMAL LOW (ref 5.0–22.3)

## 2024-01-16 ENCOUNTER — Ambulatory Visit: Payer: Self-pay | Admitting: Neurology

## 2024-01-17 ENCOUNTER — Ambulatory Visit: Admitting: Student

## 2024-01-17 VITALS — BP 128/80 | HR 116 | Wt 164.0 lb

## 2024-01-17 DIAGNOSIS — R051 Acute cough: Secondary | ICD-10-CM

## 2024-01-17 DIAGNOSIS — R6889 Other general symptoms and signs: Secondary | ICD-10-CM

## 2024-01-17 LAB — POC SOFIA 2 FLU + SARS ANTIGEN FIA
Influenza A, POC: NEGATIVE
Influenza B, POC: NEGATIVE
SARS Coronavirus 2 Ag: NEGATIVE

## 2024-01-17 NOTE — Patient Instructions (Signed)

## 2024-01-17 NOTE — Progress Notes (Unsigned)
" ° ° °  SUBJECTIVE:   CHIEF COMPLAINT / HPI:   CANDI PROFIT is a 59 y.o. female  presenting for cough and sore throat.   Symptoms started 4 days ago.  Medications tried at home Dayquil, Nyquil.  Tmax: subjective  Sick contacts: grandson  Signs of respiratory distress: denies  Appetite: normal  Hydration: normal   PERTINENT  PMH / PSH: Reviewed and updated   OBJECTIVE:   BP 128/80   Pulse (!) 116   Wt 164 lb (74.4 kg)   SpO2 100%   BMI 24.22 kg/m   Well-appearing, no acute distress HEENT: erythematous nasal turbinates, clear TMs bilaterally, mild cervical lymphadenopathy bilaterally. Mild maxillary sinus tenderness Cardio: Regular rate, regular rhythm, no murmurs on exam. <2 sec capillary refill  Pulm: Clear, no wheezing, no crackles. No increased work of breathing Abdominal: bowel sounds present, soft, non-tender, non-distended  ASSESSMENT/PLAN:   Assessment & Plan Acute cough Flu-like symptoms No signs of respiratory distress on exam. Patient appears well hydrated.  Most likely viral mediated, supportive therapy indicated with return precautions for trouble breathing or persistent fever.  POC tested preformed Flu and COVID negative.     Damien Pinal, DO [facility]  "

## 2024-01-29 ENCOUNTER — Other Ambulatory Visit: Payer: Self-pay | Admitting: Family Medicine

## 2024-02-06 ENCOUNTER — Other Ambulatory Visit: Payer: Self-pay | Admitting: Family Medicine

## 2024-02-06 DIAGNOSIS — R7303 Prediabetes: Secondary | ICD-10-CM

## 2024-06-18 ENCOUNTER — Encounter

## 2024-08-07 ENCOUNTER — Ambulatory Visit: Admitting: Neurology
